# Patient Record
Sex: Male | Born: 1980 | Race: Black or African American | Hispanic: No | Marital: Single | Smoking: Current every day smoker
Health system: Southern US, Community
[De-identification: ages and names within clinical notes are randomized; demographics above are authoritative.]

## PROBLEM LIST (undated history)

## (undated) DIAGNOSIS — G44009 Cluster headache syndrome, unspecified, not intractable: Secondary | ICD-10-CM

## (undated) DIAGNOSIS — S51839A Puncture wound without foreign body of unspecified forearm, initial encounter: Secondary | ICD-10-CM

## (undated) DIAGNOSIS — I639 Cerebral infarction, unspecified: Secondary | ICD-10-CM

## (undated) DIAGNOSIS — W3400XA Accidental discharge from unspecified firearms or gun, initial encounter: Secondary | ICD-10-CM

## (undated) HISTORY — PX: SPLENECTOMY: SUR1306

## (undated) HISTORY — DX: Cerebral infarction, unspecified: I63.9

## (undated) HISTORY — PX: ABDOMINAL EXPLORATION SURGERY: SHX538

---
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
## 2005-02-28 DIAGNOSIS — Z8719 Personal history of other diseases of the digestive system: Secondary | ICD-10-CM

## 2005-02-28 HISTORY — DX: Personal history of other diseases of the digestive system: Z87.19

## 2005-05-25 ENCOUNTER — Inpatient Hospital Stay (HOSPITAL_COMMUNITY): Admission: EM | Admit: 2005-05-25 | Discharge: 2005-05-26 | Payer: Self-pay | Admitting: Emergency Medicine

## 2006-02-18 ENCOUNTER — Emergency Department (HOSPITAL_COMMUNITY): Admission: EM | Admit: 2006-02-18 | Discharge: 2006-02-18 | Payer: Self-pay | Admitting: Emergency Medicine

## 2007-07-05 ENCOUNTER — Emergency Department (HOSPITAL_COMMUNITY): Admission: EM | Admit: 2007-07-05 | Discharge: 2007-07-05 | Payer: Self-pay | Admitting: Emergency Medicine

## 2007-07-09 ENCOUNTER — Emergency Department (HOSPITAL_COMMUNITY): Admission: EM | Admit: 2007-07-09 | Discharge: 2007-07-09 | Payer: Self-pay | Admitting: Emergency Medicine

## 2007-10-13 ENCOUNTER — Emergency Department (HOSPITAL_COMMUNITY): Admission: EM | Admit: 2007-10-13 | Discharge: 2007-10-13 | Payer: Self-pay | Admitting: Emergency Medicine
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
## 2009-02-09 ENCOUNTER — Emergency Department (HOSPITAL_COMMUNITY): Admission: EM | Admit: 2009-02-09 | Discharge: 2009-02-09 | Payer: Self-pay | Admitting: Emergency Medicine

## 2009-02-13 ENCOUNTER — Emergency Department (HOSPITAL_COMMUNITY): Admission: EM | Admit: 2009-02-13 | Discharge: 2009-02-13 | Payer: Self-pay | Admitting: Emergency Medicine

## 2010-06-01 LAB — POCT I-STAT, CHEM 8
BUN: 4 mg/dL — ABNORMAL LOW (ref 6–23)
Chloride: 101 mEq/L (ref 96–112)
Creatinine, Ser: 1 mg/dL (ref 0.4–1.5)
Hemoglobin: 18.4 g/dL — ABNORMAL HIGH (ref 13.0–17.0)
Potassium: 3.9 mEq/L (ref 3.5–5.1)
Sodium: 143 mEq/L (ref 135–145)

## 2010-06-01 LAB — ETHANOL

## 2010-06-01 LAB — DIFFERENTIAL
Lymphocytes Relative: 17 % (ref 12–46)
Lymphs Abs: 2 10*3/uL (ref 0.7–4.0)
Neutro Abs: 8.5 10*3/uL — ABNORMAL HIGH (ref 1.7–7.7)
Neutrophils Relative %: 72 % (ref 43–77)

## 2010-06-01 LAB — CBC
Platelets: 257 10*3/uL (ref 150–400)
RBC: 4.8 MIL/uL (ref 4.22–5.81)
WBC: 11.8 10*3/uL — ABNORMAL HIGH (ref 4.0–10.5)

## 2010-07-16 NOTE — Discharge Summary (Signed)
Frank Warner, Frank Warner NO.:  [id number]   MEDICAL RECORD NO.:  [id number]          PATIENT TYPE:  INP   LOCATION:  3014                         FACILITY:  [facility]   PHYSICIAN:  Lonia Blood, M.D.       DATE OF BIRTH:  01-Oct-1980   DATE OF ADMISSION:  05/24/2005  DATE OF DISCHARGE:  05/26/2005                                 DISCHARGE SUMMARY   Audio too short to transcribe (less than 5 seconds)      Lonia Blood, M.D.     SL/MEDQ  D:  05/26/2005  T:  05/26/2005  Job:  [id number]
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
## 2010-11-24 LAB — POCT CARDIAC MARKERS
CKMB, poc: <1 — ABNORMAL LOW
Myoglobin, poc: 35.3

## 2010-11-24 LAB — POCT I-STAT, CHEM 8
BUN: 6
Chloride: 101
Creatinine, Ser: 1.3
Hemoglobin: 17.3 — ABNORMAL HIGH
Potassium: 3.6
Sodium: 141

## 2014-03-24 ENCOUNTER — Emergency Department (HOSPITAL_COMMUNITY)
Admission: EM | Admit: 2014-03-24 | Discharge: 2014-03-25 | Disposition: A | Discharge status: Home / Self Care | Payer: Self-pay | Attending: Emergency Medicine | Admitting: Emergency Medicine

## 2014-03-24 ENCOUNTER — Encounter (HOSPITAL_COMMUNITY): Payer: Self-pay | Admitting: Emergency Medicine

## 2014-03-24 DIAGNOSIS — Z8679 Personal history of other diseases of the circulatory system: Secondary | ICD-10-CM | POA: Insufficient documentation

## 2014-03-24 DIAGNOSIS — R51 Headache: Secondary | ICD-10-CM | POA: Insufficient documentation

## 2014-03-24 DIAGNOSIS — R519 Headache, unspecified: Secondary | ICD-10-CM

## 2014-03-24 DIAGNOSIS — Z720 Tobacco use: Secondary | ICD-10-CM | POA: Insufficient documentation

## 2014-03-24 MED ORDER — DEXAMETHASONE SODIUM PHOSPHATE 10 MG/ML IJ SOLN
10.0000 mg | Freq: Once | INTRAMUSCULAR | Status: AC
  Administered 2014-03-24: 10 mg via INTRAVENOUS
  Filled 2014-03-24 (×2): qty 1

## 2014-03-24 MED ORDER — SODIUM CHLORIDE 0.9 % IV BOLUS (SEPSIS)
1000.0000 mL | Freq: Once | INTRAVENOUS | Status: AC
  Administered 2014-03-24: 1000 mL via INTRAVENOUS

## 2014-03-24 MED ORDER — DIPHENHYDRAMINE HCL 50 MG/ML IJ SOLN
25.0000 mg | Freq: Once | INTRAMUSCULAR | Status: AC
  Administered 2014-03-24: 25 mg via INTRAVENOUS
  Filled 2014-03-24: qty 1

## 2014-03-24 MED ORDER — METOCLOPRAMIDE HCL 5 MG/ML IJ SOLN
10.0000 mg | Freq: Once | INTRAMUSCULAR | Status: AC
  Administered 2014-03-24: 10 mg via INTRAVENOUS
  Filled 2014-03-24: qty 2

## 2014-03-24 MED ORDER — KETOROLAC TROMETHAMINE 30 MG/ML IJ SOLN
30.0000 mg | Freq: Once | INTRAMUSCULAR | Status: AC
  Administered 2014-03-24: 30 mg via INTRAVENOUS
  Filled 2014-03-24: qty 1

## 2014-03-24 NOTE — ED Notes (Signed)
Pt. reports migraine headache for 1 week with occasional nausea/vomitting x2 today , denies blurred vision . No fever or chills.

## 2014-03-24 NOTE — ED Provider Notes (Signed)
CSN: [id number]     Arrival date & time 03/24/14  1909 History   First MD Initiated Contact with Patient 03/24/14 2216     Chief Complaint  Patient presents with  . Migraine   (Consider location/radiation/quality/duration/timing/severity/associated sxs/prior Treatment) HPI  Frank Warner is a 34 yo male presenting with report of headache 1 week. He states the headache has been intermittent but very painful when they occur.  He states the pain initially began gradually across the right side of his forehead and around to his parietal scalp.  He noticed the pain would improve after drinking water and taking ibuprofen but it would return with movement or bending over.  He reports during one episode his right eye began tearing as if he was crying but his right one did not. He also reports nausea and intermittent vomiting with the headache. He has a history of similar headaches.  He denies fevers, chills, neck stiffness, blurred vision, focal weakness or focal deficit.     Past Medical History  Diagnosis Date  . Migraine headache    History reviewed. No pertinent past surgical history. No family history on file. History  Substance Use Topics  . Smoking status: Current Every Day Smoker  . Smokeless tobacco: Not on file  . Alcohol Use: Yes    Review of Systems  Constitutional: Negative for fever and chills.  HENT: Negative for sore throat.   Eyes: Negative for visual disturbance.  Respiratory: Negative for cough and shortness of breath.   Cardiovascular: Negative for chest pain and leg swelling.  Gastrointestinal: Positive for nausea and vomiting. Negative for diarrhea.  Genitourinary: Negative for dysuria.  Musculoskeletal: Negative for myalgias.  Skin: Negative for rash.  Neurological: Positive for headaches. Negative for weakness and numbness.      Allergies  Review of patient's allergies indicates no known allergies.  Home Medications   Prior to Admission medications    Medication Sig Start Date End Date Taking? Authorizing Provider  acetaminophen (TYLENOL) 500 MG tablet Take 1,000 mg by mouth every 6 (six) hours as needed for headache.   Yes Historical Provider, MD  ibuprofen (ADVIL,MOTRIN) 200 MG tablet Take 800 mg by mouth every 6 (six) hours as needed for headache.   Yes Historical Provider, MD   BP 131/72 mmHg  Pulse 63  Temp(Src) 98.2 F (36.8 C) (Oral)  Resp 18  SpO2 97% Physical Exam  Constitutional: He is oriented to person, place, and time. He appears well-developed and well-nourished. No distress.  HENT:  Head: Normocephalic and atraumatic.  Mouth/Throat: Oropharynx is clear and moist.  Eyes: Conjunctivae are normal. Pupils are equal, round, and reactive to light.  Neck: Normal range of motion. Neck supple. No thyromegaly present.  Cardiovascular: Normal rate, regular rhythm and intact distal pulses.   Pulmonary/Chest: Effort normal and breath sounds normal. No respiratory distress.  Abdominal: Soft. There is no tenderness.  Musculoskeletal: He exhibits no tenderness.  Lymphadenopathy:    He has no cervical adenopathy.  Neurological: He is alert and oriented to person, place, and time. He has normal strength. No cranial nerve deficit or sensory deficit. Coordination normal. GCS eye subscore is 4. GCS verbal subscore is 5. GCS motor subscore is 6.  Cranial nerves 2-12 intact  Skin: Skin is warm and dry. No rash noted. He is not diaphoretic.  Psychiatric: He has a normal mood and affect.  Nursing note and vitals reviewed.   ED Course  Procedures (including critical care time) Labs Review Labs Reviewed -  No data to display  Imaging Review No results found.   EKG Interpretation None      MDM   Final diagnoses:  Nonintractable headache, unspecified chronicity pattern, unspecified headache type   34 yo with history of recurrent headaches. His presentation is like pts typical HA as it was gradual in onset and hurts on the right  side of his forehead and parietal scalp. His exam is non concerning for [facility], ICH, Meningitis, or temporal arteritis. He is afebrile with no focal neuro deficits, nuchal rigidity, or change in vision. HA treated with NS bolus and toradol, compazine and benedryl. However not much improvement of symptoms until IV decadron given.  Relief of symptoms after decadron. Resources provided to establish care with PCP for follow-up.  Pt is well-appearing and in no acute distress. Pt verbalizes understanding and is agreeable with plan to dc.    Filed Vitals:   03/24/14 1912 03/24/14 2235 03/24/14 2344  BP: 131/72 126/89   Pulse: 63 57   Temp: 98.2 F (36.8 C)    TempSrc: Oral    Resp: 18    SpO2: 97% 100% 100%   Meds given in ED:  Medications  sodium chloride 0.9 % bolus 1,000 mL (0 mLs Intravenous Stopped 03/24/14 2356)  ketorolac (TORADOL) 30 MG/ML injection 30 mg (30 mg Intravenous Given 03/24/14 2253)  metoCLOPramide (REGLAN) injection 10 mg (10 mg Intravenous Given 03/24/14 2253)  diphenhydrAMINE (BENADRYL) injection 25 mg (25 mg Intravenous Given 03/24/14 2253)  dexamethasone (DECADRON) injection 10 mg (10 mg Intravenous Given 03/24/14 2348)    Discharge Medication List as of 03/25/2014 12:00 AM         Harle Battiest, NP 03/26/14 1441  Hilario Quarry, MD 04/04/14 1016

## 2014-03-24 NOTE — ED Notes (Signed)
PA at bedside.

## 2014-03-24 NOTE — ED Notes (Signed)
Per ems-- pt c/o migraine headache x 1 week. Taking ibuprofen and tylenol without much relief.

## 2014-03-25 NOTE — Discharge Instructions (Signed)
Please follow the directions provided. Use the resource guide or the referral given to establish care with a primary care doctor for further management and evaluation of your headaches. When you feel a headache started to come on it may be helpful to drink plenty of fluid and take a dose of the medicine called Excedrin Migraine as directed.  Don't hesitate to return for any new, worsening, or concerning symptoms.   SEEK IMMEDIATE MEDICAL CARE IF:  Your migraine becomes severe.  You have a fever.  You have a stiff neck.  You have vision loss.  You have muscular weakness or loss of muscle control.  You start losing your balance or have trouble walking.  You feel faint or pass out.  You have severe symptoms that are different from your first symptoms.   Emergency Department Resource Guide 1) Find a Doctor and Pay Out of Pocket Although you won't have to find out who is covered by your insurance plan, it is a good idea to ask around and get recommendations. You will then need to call the office and see if the doctor you have chosen will accept you as a new patient and what types of options they offer for patients who are self-pay. Some doctors offer discounts or will set up payment plans for their patients who do not have insurance, but you will need to ask so you aren't surprised when you get to your appointment.  2) Contact Your Local Health Department Not all health departments have doctors that can see patients for sick visits, but many do, so it is worth a call to see if yours does. If you don't know where your local health department is, you can check in your phone book. The CDC also has a tool to help you locate your state's health department, and many state websites also have listings of all of their local health departments.  3) Find a Walk-in Clinic If your illness is not likely to be very severe or complicated, you may want to try a walk in clinic. These are popping up all over the  country in pharmacies, drugstores, and shopping centers. They're usually staffed by nurse practitioners or physician assistants that have been trained to treat common illnesses and complaints. They're usually fairly quick and inexpensive. However, if you have serious medical issues or chronic medical problems, these are probably not your best option.  No Primary Care Doctor: - Call Health Connect at  [phone] - they can help you locate a primary care doctor that  accepts your insurance, provides certain services, etc. - Physician Referral Service- [phone]  Chronic Pain Problems: Organization         Address  Phone   Notes  [facility]  [phone] Patients need to be referred by their primary care doctor.   Medication Assistance: Organization         Address  Phone   Notes  [facility] Medication [facility] [address] [place] [place], [place] [address] [phone] --Must be a resident of [facility] -- Must have NO insurance coverage whatsoever (no Medicaid/ Medicare, etc.) -- The pt. MUST have a primary care doctor that directs their care regularly and follows them in the community   MedAssist  [phone]   [employer]  [phone]    Agencies that provide inexpensive medical care: Organization         Address  Phone   Notes  [facility]  [phone]   [facility]    [phone]   [facility] [address] [place], [place] [address] [phone]) [phone]   [facility] [place] [address], [place] [phone](336) [phone]   Planned Parenthood    [phone](336) [phone]   [facility]    [phone]) [phone]   [facility]  [address], [place] Phone:  [phone](336) [phone], Fax:  [phone] Hours of Operation:  9 am - 6 pm, M-F.  Also accepts Medicaid/Medicare and self-pay.  [facility]  [address], [place] Phone: [phone](336) [phone], Fax: [phone](336) [phone]. Hours of Operation:  8:30 am - 5:30 pm, M-F.  Also accepts Medicaid and self-pay.  [facility] [place] [address], [place] Phone: [phone](336) [phone]   [facility] [address], [place], [place] [phone], Ext. [phone] Mondays & Thursdays: 7-9 AM.  First 15 patients are seen on a first come, first serve basis.    Medicaid-accepting [facility] Providers:  Organization         Address  Phone   Notes  [facility] [phone], [place] [phone]) [phone] Also accepts self-pay patients.  [facility] [address] [place], [place]  [phone]) [phone]   [facility] [address], [place] [phone](336) [phone]   [facility] [address] [place] [address], [place] [phone](336) [phone]   Renaye RakersVeita Bland [address], [place]   [phone]) [phone] Only accepts [place] Access [place] patients after they have their name applied to their card.   Self-Pay (no insurance) in [facility]:  Organization         Address  Phone   Notes  Sickle Cell Patients, [facility] [address], [place] [phone](336) [phone]   [facility] [address], [place] [phone](336) [phone]   [facility] [place]  [address], [place] [phone](336) [phone]   [facility]/Dr. Osei-Bonsu  [address] [place] [address], [place] or [phone], [place] [phone]) [phone] Phone number for both [place] and [place] locations is the same.  [facility] [address], [place] [phone]) [phone]   [facility] [place] [address] [place] [address], [place] or [address] [phone](336) [phone](336) [phone]   [facility] [address], [place] [phone](336) [phone], phone; [phone](336) [phone], fax Sees patients 1st and 3rd Saturday of every month.  Must  not qualify for public or private insurance (i.e. Medicaid, Medicare, [place] Health Choice, Veterans' Benefits)  Household income should be no more than 200% of the poverty level The clinic cannot treat you if you are pregnant or think you are pregnant  Sexually transmitted diseases are not treated at the clinic.    Dental Care: Organization         Address  Phone  Notes  [facility] [address], [place] [phone]) [phone] Accepts children up to age 32 who are enrolled in [place] or [place] Health Choice; pregnant women with a Medicaid card; and children who have applied for Medicaid or [place] Health Choice, but were declined, whose parents can pay a reduced fee at time of service.  [facility] Department of [employer] [place]  [address], [place] [phone] Accepts children up to age 56 who are enrolled in Medicaid or [place] Health Choice; pregnant women with a Medicaid card; and children who have applied for Medicaid or [place] Health Choice, but were declined, whose parents can pay a reduced fee at time of service.  [facility]  [address] [place], [place] [phone] Patients are seen by appointment only. Walk-ins are not accepted. [facility] will see patients 43 years of age and older. Monday - Tuesday (8am-5pm) Most Wednesdays (8:30-5pm) $30 per visit, cash only  [facility] Adult [facility] Access PRO[facility]  [address], [facility] [phone] Patients are seen by appointment only. Walk-ins are not accepted. [facility] will see patients 22 years of age and older. One Wednesday Evening (Monthly: Volunteer Based).  $30 per visit, cash only  [employer]  [phone] for adults; Children under age 22, call Graduate Pediatric Dentistry at [phone]. Children aged 51-14, please call [phone] to request a pediatric application.  Dental services are  provided in all areas of dental care including fillings, crowns and bridges, complete and partial dentures, implants, gum treatment, root canals, and extractions. Preventive care is also provided. Treatment is provided to both adults and children. Patients are selected via a lottery and there is often a waiting list.   [facility] [address], [place]  [phone] [email or web address]   [facility] [address] [place], [place] [phone], Ext. [phone] Second and Fourth Thursday of each month, opens at 6:30 AM; Clinic ends at 9 AM.  Patients are seen on a first-come first-served basis, and a limited number are seen during each clinic.   [facility]  [address] [place] [place], [place] [phone]   Eligibility Requirements You must have lived in [place], [place], or [place] counties for at least the last three months.   You cannot be eligible for state or federal sponsored [place], including [employer], [place], or [employer].   You generally cannot be eligible for healthcare insurance through your employer.    How to apply: Eligibility screenings are held every Tuesday and Wednesday afternoon from 1:00 pm until 4:00 pm. You do not need an appointment for the interview!  [facility] [address], [place], [place] [phone]   [facility] Health Department  [phone]   [facility] Health Department  [phone]   [facility] Health Department  [phone]    Behavioral Health Resources in the Community: Intensive Outpatient Programs Organization         Address  Phone  Notes  [facility] [address], [place], [place] [phone]   [facility] Outpatient [address], [place], [place] [phone]   [facility]: [facility] [address], [place], [place]  [phone]   [facility] Mental Health [address]  [place], [place]  [phone] or [phone]   Substance Abuse Resources Organization         Address  Phone  Notes  Al[facility] Drug Services  [phone]   [facility]  [phone]   [facility] [place]  [phone]   Floydene Flock  [phone]   Residential & Outpatient Substance Abuse Program  [phone]   Psychological Services Organization         Address  Phone  Notes  [facility] Behavioral Health  [phone]  [employer] Services  [phone]   [facility] Mental Health [address] [place] [phone] or [phone]    Mobile Crisis Teams Organization         Address  Phone  Notes  [facility], Mobile Crisis Care Unit  [phone]   [facility]  [address] [place] [address] [place], [place] [phone]   Doristine Locks [address] [place] [phone]    Self-Help/Support Groups Organization         Address  Phone             Notes  [facility] [place] - variety of support groups  [phone]- I7437963 Call for more information  Narcotics Anonymous (NA), [facility] [address], [employer] [place]  2 meetings at this location   Statistician         Address  Phone  Notes  [facility] [address],    [place]  [phone]   [facility]  [address] [place] [address], [place], [place] [phone]   [facility] Treatment Facility [address] [place], [place] [phone] Admissions: 8am-3pm M-F  [facility] [address]    [place], [place] [phone]   [facility] [address] [place], [place], [place] [phone]   [facility] [address]  [place], [place] [phone]   [facility] - Intensive Outpatient [address], [place], [place] [phone]   [facility] ([facility] [address] [place]  [place], [place] [phone] or  [phone]   [facility] ([facility]) [address], [place], [place] [phone] Accepts Medicaid  [facility] [address],  [place] [phone] Substance Abuse/Addiction Treatment   [facility] Organization         Address  Phone  Notes  [facility]  [phone]   Angie Fava, PhD [address] [place], [place]   [phone] or [phone]   [facility] Behavioral   [address] [place], [place] [phone]   [facility] [address], [place], [place] [phone] Insurance/Medicaid/sponsorship through [facility] [address]                                    [place], [place] [phone] Therapy/tele-psych/case  [facility] [address], [place] [phone]    Dr. Lolly Mustache  [phone]   [facility] [place] [facility]  [employer] [facility] [place] [facility] Dept. 1) [address] [place] 2) [address] [place] 3)  [address], [place] [phone]  [phone]   [facility]al Health Child Abuse Hotline 857-147-1663 or [phone] (After Hours)

## 2014-07-29 ENCOUNTER — Encounter (HOSPITAL_COMMUNITY): Payer: Self-pay | Admitting: Emergency Medicine

## 2014-07-29 ENCOUNTER — Emergency Department (HOSPITAL_COMMUNITY): Payer: Self-pay

## 2014-07-29 ENCOUNTER — Inpatient Hospital Stay (HOSPITAL_COMMUNITY)
Admission: EM | Admit: 2014-07-29 | Discharge: 2014-08-06 | DRG: 131 | Disposition: A | Discharge status: Home / Self Care | Payer: Self-pay | Attending: Internal Medicine | Admitting: Internal Medicine

## 2014-07-29 DIAGNOSIS — Z720 Tobacco use: Secondary | ICD-10-CM

## 2014-07-29 DIAGNOSIS — K047 Periapical abscess without sinus: Principal | ICD-10-CM

## 2014-07-29 DIAGNOSIS — K045 Chronic apical periodontitis: Secondary | ICD-10-CM | POA: Diagnosis present

## 2014-07-29 DIAGNOSIS — K029 Dental caries, unspecified: Secondary | ICD-10-CM | POA: Diagnosis present

## 2014-07-29 DIAGNOSIS — K040 Pulpitis: Secondary | ICD-10-CM | POA: Diagnosis present

## 2014-07-29 DIAGNOSIS — S025XXA Fracture of tooth (traumatic), initial encounter for closed fracture: Secondary | ICD-10-CM | POA: Diagnosis present

## 2014-07-29 DIAGNOSIS — F17210 Nicotine dependence, cigarettes, uncomplicated: Secondary | ICD-10-CM | POA: Diagnosis present

## 2014-07-29 DIAGNOSIS — R591 Generalized enlarged lymph nodes: Secondary | ICD-10-CM | POA: Diagnosis present

## 2014-07-29 DIAGNOSIS — D72829 Elevated white blood cell count, unspecified: Secondary | ICD-10-CM

## 2014-07-29 DIAGNOSIS — Z23 Encounter for immunization: Secondary | ICD-10-CM

## 2014-07-29 DIAGNOSIS — L0201 Cutaneous abscess of face: Secondary | ICD-10-CM

## 2014-07-29 DIAGNOSIS — M272 Inflammatory conditions of jaws: Secondary | ICD-10-CM | POA: Diagnosis present

## 2014-07-29 DIAGNOSIS — K0889 Other specified disorders of teeth and supporting structures: Secondary | ICD-10-CM

## 2014-07-29 DIAGNOSIS — L03211 Cellulitis of face: Secondary | ICD-10-CM | POA: Diagnosis present

## 2014-07-29 DIAGNOSIS — K05219 Aggressive periodontitis, localized, unspecified severity: Secondary | ICD-10-CM

## 2014-07-29 LAB — CBC WITH DIFFERENTIAL/PLATELET
BASOS ABS: 0 10*3/uL (ref 0.0–0.1)
Basophils Relative: 0 % (ref 0–1)
EOS ABS: 0 10*3/uL (ref 0.0–0.7)
Eosinophils Relative: 0 % (ref 0–5)
HEMATOCRIT: 46.6 % (ref 39.0–52.0)
Hemoglobin: 16.5 g/dL (ref 13.0–17.0)
LYMPHS ABS: 2.1 10*3/uL (ref 0.7–4.0)
Lymphocytes Relative: 7 % — ABNORMAL LOW (ref 12–46)
MCH: 32.5 pg (ref 26.0–34.0)
MCHC: 35.4 g/dL (ref 30.0–36.0)
MCV: 91.9 fL (ref 78.0–100.0)
MONO ABS: 2.4 10*3/uL — AB (ref 0.1–1.0)
Monocytes Relative: 8 % (ref 3–12)
NEUTROS ABS: 25 10*3/uL — AB (ref 1.7–7.7)
Neutrophils Relative %: 85 % — ABNORMAL HIGH (ref 43–77)
Platelets: 226 10*3/uL (ref 150–400)
RBC: 5.07 MIL/uL (ref 4.22–5.81)
RDW: 13.4 % (ref 11.5–15.5)
Smear Review: ADEQUATE
WBC: 29.5 10*3/uL — ABNORMAL HIGH (ref 4.0–10.5)

## 2014-07-29 LAB — BASIC METABOLIC PANEL
Anion gap: 11 (ref 5–15)
BUN: <5 mg/dL — ABNORMAL LOW (ref 6–20)
CHLORIDE: 99 mmol/L — AB (ref 101–111)
CO2: 24 mmol/L (ref 22–32)
Calcium: 9.1 mg/dL (ref 8.9–10.3)
Creatinine, Ser: 1.23 mg/dL (ref 0.61–1.24)
GFR calc Af Amer: >60 mL/min (ref >60)
GFR calc non Af Amer: >60 mL/min (ref >60)
GLUCOSE: 108 mg/dL — AB (ref 65–99)
POTASSIUM: 4.2 mmol/L (ref 3.5–5.1)
SODIUM: 134 mmol/L — AB (ref 135–145)

## 2014-07-29 MED ORDER — MORPHINE SULFATE 4 MG/ML IJ SOLN
4.0000 mg | Freq: Once | INTRAMUSCULAR | Status: AC
  Administered 2014-07-29: 4 mg via INTRAVENOUS
  Filled 2014-07-29: qty 1

## 2014-07-29 MED ORDER — HYDROMORPHONE HCL 1 MG/ML IJ SOLN
0.5000 mg | INTRAMUSCULAR | Status: DC | PRN
  Administered 2014-07-30 (×2): 0.5 mg via INTRAVENOUS
  Filled 2014-07-29 (×2): qty 1

## 2014-07-29 MED ORDER — HYDROMORPHONE HCL 1 MG/ML IJ SOLN
0.5000 mg | Freq: Once | INTRAMUSCULAR | Status: AC
  Administered 2014-07-29: 0.5 mg via INTRAVENOUS
  Filled 2014-07-29: qty 1

## 2014-07-29 MED ORDER — IOHEXOL 300 MG/ML  SOLN
75.0000 mL | Freq: Once | INTRAMUSCULAR | Status: AC | PRN
  Administered 2014-07-29: 75 mL via INTRAVENOUS

## 2014-07-29 MED ORDER — CLINDAMYCIN PHOSPHATE 600 MG/50ML IV SOLN
600.0000 mg | Freq: Once | INTRAVENOUS | Status: AC
  Administered 2014-07-29: 600 mg via INTRAVENOUS
  Filled 2014-07-29: qty 50

## 2014-07-29 MED ORDER — SODIUM CHLORIDE 0.9 % IV SOLN
3.0000 g | Freq: Four times a day (QID) | INTRAVENOUS | Status: DC
  Administered 2014-07-30 – 2014-08-02 (×14): 3 g via INTRAVENOUS
  Filled 2014-07-29 (×17): qty 3

## 2014-07-29 MED ORDER — HEPARIN SODIUM (PORCINE) 5000 UNIT/ML IJ SOLN
5000.0000 [IU] | Freq: Three times a day (TID) | INTRAMUSCULAR | Status: AC
  Administered 2014-07-30 – 2014-08-04 (×18): 5000 [IU] via SUBCUTANEOUS
  Filled 2014-07-29 (×15): qty 1

## 2014-07-29 MED ORDER — ONDANSETRON HCL 4 MG/2ML IJ SOLN
4.0000 mg | Freq: Once | INTRAMUSCULAR | Status: AC
  Administered 2014-07-29: 4 mg via INTRAVENOUS
  Filled 2014-07-29: qty 2

## 2014-07-29 MED ORDER — BENZOCAINE 10 % MT GEL: Freq: Four times a day (QID) | OROMUCOSAL | Status: DC | PRN
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
## 2014-07-29 NOTE — ED Notes (Signed)
Pt to CT

## 2014-07-29 NOTE — ED Notes (Signed)
Pt. reports worsening right lower molar pain with swelling onset this week , unrelieved by OTC Oragel .

## 2014-07-29 NOTE — ED Notes (Signed)
MD at bedside. 

## 2014-07-30 ENCOUNTER — Observation Stay (HOSPITAL_COMMUNITY): Payer: Self-pay

## 2014-07-30 DIAGNOSIS — L0201 Cutaneous abscess of face: Secondary | ICD-10-CM

## 2014-07-30 DIAGNOSIS — K029 Dental caries, unspecified: Secondary | ICD-10-CM

## 2014-07-30 DIAGNOSIS — L03211 Cellulitis of face: Secondary | ICD-10-CM

## 2014-07-30 MED ORDER — OXYCODONE-ACETAMINOPHEN 5-325 MG PO TABS
1.0000 | ORAL_TABLET | Freq: Four times a day (QID) | ORAL | Status: DC
  Administered 2014-07-30 – 2014-08-04 (×21): 1 via ORAL
  Filled 2014-07-30 (×21): qty 1

## 2014-07-30 MED ORDER — DEXAMETHASONE SODIUM PHOSPHATE 4 MG/ML IJ SOLN: 4.0000 mg | Freq: Four times a day (QID) | INTRAMUSCULAR | Status: DC

## 2014-07-30 MED ORDER — PNEUMOCOCCAL VAC POLYVALENT 25 MCG/0.5ML IJ INJ
0.5000 mL | INJECTION | INTRAMUSCULAR | Status: AC
  Administered 2014-07-31: 0.5 mL via INTRAMUSCULAR

## 2014-07-30 MED ORDER — HYDROMORPHONE HCL 1 MG/ML IJ SOLN
1.0000 mg | INTRAMUSCULAR | Status: DC | PRN
  Administered 2014-07-30 – 2014-08-05 (×26): 1 mg via INTRAVENOUS
  Filled 2014-07-30 (×28): qty 1

## 2014-07-30 NOTE — Progress Notes (Addendum)
[facility] Progress Note   Frank Warner ZOX:[id number] DOB: 04/07/1980 DOA: 07/29/2014 PCP: No PCP Per Patient  Brief narrative: Frank Pihyren Mccowan is a 34 y.o. male who presents to the ER for right 1st molar pain, swelling of right face and jaw. Initial CT scan did not reveal an abscess but I have just received a call from the radiologist, Dr. Lindi Adiean Boyle, this morning reviewed the CT and feels that he has a right mandibular subperiosteal abscess measuring 0.5 x 2.3 cm.   Subjective: Patient is complaining of severe pain in his right face and right lower jaw. He is not having any trouble with swallowing.  Assessment/Plan: Principal Problem:   Odontogenic infection of jaw/abscess and cellulitis right face - [facility] - Dental caries-abscess noted above-have contacted Dr. Robin SearingKulinsky (dental surgeon) who is not on call today and not available until Monday- he recommends to continue antibiotics and f/u with dental as outpt- if patient is still here on Monday, he will see the patient.  -have spoken with ENT, Dr Jearld FentonByers who is not going to see the patient and recommends Dental surgery to see the patient - WBC count was 29 on admission - Continue Unasyn - Increase Dilaudid and add hydrocodone to help control pain - Full liquid diet  Code Status: Full code Family Communication:  Disposition Plan:  DVT prophylaxis: Heparin Consultants: ENT/dental surgery Procedures:  Antibiotics: Anti-infectives    Start     Dose/Rate Route Frequency Ordered Stop   07/29/14 2359  Ampicillin-Sulbactam (UNASYN) 3 g in sodium chloride 0.9 % 100 mL IVPB     3 g 100 mL/hr over 60 Minutes Intravenous Every 6 hours 07/29/14 2329     07/29/14 2015  clindamycin (CLEOCIN) IVPB 600 mg     600 mg 100 mL/hr over 30 Minutes Intravenous  Once 07/29/14 2003 07/29/14 2248      Objective: Filed Weights   07/30/14 0148  Weight: 97.2 kg (214 lb 4.6 oz)    Intake/Output Summary  (Last 24 hours) at 07/30/14 1250 Last data filed at 07/30/14 0947  Gross per 24 hour  Intake   1260 ml  Output      0 ml  Net   1260 ml     Vitals Filed Vitals:   07/29/14 1928 07/29/14 2314 07/30/14 0148 07/30/14 0522  BP: 138/103 142/90 164/85 144/72  Pulse: 91 87 88 67  Temp: 98.2 F (36.8 C)  99.5 F (37.5 C) 99.1 F (37.3 C)  TempSrc: Oral  Oral Oral  Resp: 22 18  17   Height:   6' (1.829 m)   Weight:   97.2 kg (214 lb 4.6 oz)   SpO2: 99% 99% 92% 100%    Exam:  General:  Pt is alert, not in acute distress  HEENT: No icterus, No thrush, oral mucosa moist- swelling of right mandibular and maxillary area with tenderness  Cardiovascular: regular rate and rhythm, S1/S2 No murmur  Respiratory: clear to auscultation bilaterally   Abdomen: Soft, +Bowel sounds, non tender, non distended, no guarding  MSK: No LE edema, cyanosis or clubbing  Data Reviewed: Basic Metabolic Panel:  Recent Labs Lab 07/29/14 2155  NA 134*  K 4.2  CL 99*  CO2 24  GLUCOSE 108*  BUN <5*  CREATININE 1.23  CALCIUM 9.1   Liver Function Tests: No results for input(s): AST, ALT, ALKPHOS, BILITOT, PROT, ALBUMIN in the last 168 hours. No results for input(s): LIPASE, AMYLASE in the last  168 hours. No results for input(s): AMMONIA in the last 168 hours. CBC:  Recent Labs Lab 07/29/14 2044  WBC 29.5*  NEUTROABS 25.0*  HGB 16.5  HCT 46.6  MCV 91.9  PLT 226   Cardiac Enzymes: No results for input(s): CKTOTAL, CKMB, CKMBINDEX, TROPONINI in the last 168 hours. BNP (last 3 results) No results for input(s): BNP in the last 8760 hours.  ProBNP (last 3 results) No results for input(s): PROBNP in the last 8760 hours.  CBG: No results for input(s): GLUCAP in the last 168 hours.  No results found for this or any previous visit (from the past 240 hour(s)).   Studies: Dg Orthopantogram  07/30/2014   CLINICAL DATA:  Swelling to right jawline/toothache since yesterday.  EXAM:  ORTHOPANTOGRAM/PANORAMIC  COMPARISON:  Soft tissue neck CT today.  FINDINGS: Bony structures are within normal. No significant periodontal disease. Bilateral upper and lower dental caries.  IMPRESSION: No focal bony abnormality and no significant periodontal disease. Several dental caries.   Electronically Signed   By: Elberta Fortis M.D.   On: 07/30/2014 12:24   Ct Soft Tissue Neck W Contrast  07/29/2014   CLINICAL DATA:  Dental abscess to right lower molar. 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 Evaluate extent of the abscess. Dental pain, swelling onset this week.  EXAM: CT NECK WITH CONTRAST  TECHNIQUE: Multidetector CT imaging of the neck was performed using the standard protocol following the bolus administration of intravenous contrast.  CONTRAST:  75mL OMNIPAQUE IOHEXOL 300 MG/ML  SOLN  COMPARISON:  None.  FINDINGS: Pharynx and larynx: Prominent adenoidal soft tissue. Prominent tonsillar hypertrophy, right greater than left. No fluid collection. There is no retropharyngeal effusion, fluid collection or abscess. The epiglottis is normal. Diffuse soft tissue stranding about the right face and neck without subcutaneous fluid collection.  Salivary glands: No inflammation.  Thyroid: Normal.  Lymph nodes: Prominent cervical lymph nodes, right greater than left, likely reactive.  Vascular: Patent, no thrombosis or inflammation.  Limited intracranial: Normal.  Visualized orbits: Normal.  Mastoids and visualized paranasal sinuses: Dental carie of the right upper molar without periapical lucency or abscess. No periapical lucencies about any tooth. Mild mucosal thickening of the left maxillary sinus, paranasal sinuses are otherwise clear. Mastoids are clear.  Skeleton: Normal cervical spine alignment.  Upper chest: Included mediastinum is normal. No mediastinal adenopathy or fluid collection. No mediastinal inflammation. The lung apices are clear, no consolidation.  IMPRESSION: 1. Dental carie of the right upper molar. No  definite CT abnormality of the right lower molars. There is soft tissue stranding about the right face and upper neck. No periapical or soft tissue abscess. No inflammatory extension to the mediastinum or retropharyngeal soft tissues. 2. Prominent cervical lymph nodes, right greater than left are likely reactive. Prominence of the palatine tonsils and adenoids, also likely reactive.   Electronically Signed   By: Rubye Oaks M.D.   On: 07/29/2014 21:32    Scheduled Meds:  Scheduled Meds: . ampicillin-sulbactam (UNASYN) IV  3 g Intravenous Q6H  . heparin  5,000 Units Subcutaneous 3 times per day  . oxyCODONE-acetaminophen  1 tablet Oral Q6H  . [START ON 07/31/2014] pneumococcal 23 valent vaccine  0.5 mL Intramuscular Tomorrow-1000   Continuous Infusions:   Time spent on care of this patient: 35 min   Dacoda Spallone, MD 07/30/2014, 12:50 PM    [facility] Office  [phone] Pager - Text Page per [email or web address] If 7PM-7AM, please contact night-coverage [email or web address]

## 2014-07-31 DIAGNOSIS — L0201 Cutaneous abscess of face: Secondary | ICD-10-CM

## 2014-07-31 LAB — CBC
HCT: 44.7 % (ref 39.0–52.0)
Hemoglobin: 14.9 g/dL (ref 13.0–17.0)
MCH: 31.5 pg (ref 26.0–34.0)
MCHC: 33.3 g/dL (ref 30.0–36.0)
MCV: 94.5 fL (ref 78.0–100.0)
Platelets: 213 10*3/uL (ref 150–400)
RBC: 4.73 MIL/uL (ref 4.22–5.81)
RDW: 13.6 % (ref 11.5–15.5)
WBC: 20.2 10*3/uL — AB (ref 4.0–10.5)

## 2014-07-31 NOTE — Progress Notes (Signed)
[facility] Progress Note   Frank Warner ZOX:[id number] DOB: [birth year] DOA: 07/29/2014 PCP: No PCP Per Patient  Brief narrative: Frank Warner is a 34 y.o. male who presents to the ER for right 1st molar pain, swelling of right face and jaw. Initial CT scan did not reveal an abscess but I have just received a call from the radiologist, Dr. Lindi Adie, this morning reviewed the CT and feels that he has a right mandibular subperiosteal abscess measuring 0.5 x 2.3 cm.  Subjective: Swelling and pain in right jaw slightly improved. Wants to try soft food.   Assessment/Plan: Principal Problem:   Odontogenic infection of jaw/abscess and cellulitis right face - we have NO DENTAL COVERAGE at [facility]  - Dental caries-abscess noted above- contacted Dr. Robin Searing (dental surgeon) on 6/1 who was not on call  and will not available until Monday- he recommends to continue antibiotics and f/u with dental as outpt- if patient is still here on Monday, he will see the patient.  -as we had no official dental coverage, I did speak with ENT, Dr Jearld Fenton, in regards to the above mentioned abscess, he stated that he is not going to see the patient and recommended Dental surgery to see the patient - WBC count was 29 on admission- steadily improving - Continue Unasyn - Increased Dilaudid and add hydrocodone to help control pain - advance to soft diet today  Code Status: Full code Family Communication:  Disposition Plan:  DVT prophylaxis: Heparin Consultants: ENT/dental surgery-  phone call only  Procedures:  Antibiotics: Anti-infectives    Start     Dose/Rate Route Frequency Ordered Stop   07/29/14 2359  Ampicillin-Sulbactam (UNASYN) 3 g in sodium chloride 0.9 % 100 mL IVPB     3 g 100 mL/hr over 60 Minutes Intravenous Every 6 hours 07/29/14 2329     07/29/14 2015  clindamycin (CLEOCIN) IVPB 600 mg     600 mg 100 mL/hr over 30 Minutes Intravenous  Once 07/29/14 2003 07/29/14 2248       Objective: Filed Weights   07/30/14 0148  Weight: 97.2 kg (214 lb 4.6 oz)    Intake/Output Summary (Last 24 hours) at 07/31/14 1153 Last data filed at 07/31/14 0900  Gross per 24 hour  Intake   1727 ml  Output      0 ml  Net   1727 ml     Vitals Filed Vitals:   07/30/14 0522 07/30/14 1307 07/30/14 2212 07/31/14 0621  BP: 144/72 136/84 151/87 133/69  Pulse: 67 68 66 57  Temp: 99.1 F (37.3 C) 98.6 F (37 C) 99 F (37.2 C) 98.8 F (37.1 C)  TempSrc: Oral Oral Oral Oral  Resp: Height:      Weight:      SpO2: 100% 100% 100% 99%    Exam:  General:  Pt is alert, not in acute distress  HEENT: No icterus, No thrush, oral mucosa moist- swelling of right mandibular and maxillary area with tenderness  Cardiovascular: regular rate and rhythm, S1/S2 No murmur  Respiratory: clear to auscultation bilaterally   Abdomen: Soft, +Bowel sounds, non tender, non distended, no guarding  MSK: No LE edema, cyanosis or clubbing  Data Reviewed: Basic Metabolic Panel:  Recent Labs Lab 07/29/14 2155  NA 134*  K 4.2  CL 99*  CO2 24  GLUCOSE 108*  BUN <5*  CREATININE 1.23  CALCIUM 9.1   Liver Function Tests: No results for input(s): AST, ALT, ALKPHOS,  BILITOT, PROT, ALBUMIN in the last 168 hours. No results for input(s): LIPASE, AMYLASE in the last 168 hours. No results for input(s): AMMONIA in the last 168 hours. CBC:  Recent Labs Lab 07/29/14 2044 07/31/14 0351  WBC 29.5* 20.2*  NEUTROABS 25.0*  --   HGB 16.5 14.9  HCT 46.6 44.7  MCV 91.9 94.5  PLT 226 213   Cardiac Enzymes: No results for input(s): CKTOTAL, CKMB, CKMBINDEX, TROPONINI in the last 168 hours. BNP (last 3 results) No results for input(s): BNP in the last 8760 hours.  ProBNP (last 3 results) No results for input(s): PROBNP in the last 8760 hours.  CBG: No results for input(s): GLUCAP in the last 168 hours.  No results found for this or any previous visit (from the past 240  hour(s)).   Studies: Dg Orthopantogram  07/30/2014   CLINICAL DATA:  Swelling to right jawline/toothache since yesterday.  EXAM: ORTHOPANTOGRAM/PANORAMIC  COMPARISON:  Soft tissue neck CT today.  FINDINGS: Bony structures are within normal. No significant periodontal disease. Bilateral upper and lower dental caries.  IMPRESSION: No focal bony abnormality and no significant periodontal disease. Several dental caries.   Electronically Signed   By: Elberta Fortisaniel  Boyle M.D.   On: 07/30/2014 12:24   Ct Soft Tissue Neck W Contrast  07/30/2014   ADDENDUM REPORT: 07/30/2014 12:48  ADDENDUM: Dental caries are also present over the first lower molar teeth bilaterally as well as the most posterior bilateral upper molar teeth. There is a fluid collection adjacent the right body of the mandible measuring 0.5 x 2.3 cm in transverse in AP dimensions likely a small abscess of dental origin. Moderate adjacent subcutaneous inflammation. No significant periapical lucency/ periodontal disease is evident.  Findings called to Dr. Butler Denmarkizwan at the time dictation.   Electronically Signed   By: Elberta Fortisaniel  Boyle M.D.   On: 07/30/2014 12:48   07/30/2014   CLINICAL DATA:  Dental abscess to right lower molar. 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 Evaluate extent of the abscess. Dental pain, swelling onset this week.  EXAM: CT NECK WITH CONTRAST  TECHNIQUE: Multidetector CT imaging of the neck was performed using the standard protocol following the bolus administration of intravenous contrast.  CONTRAST:  75mL OMNIPAQUE IOHEXOL 300 MG/ML  SOLN  COMPARISON:  None.  FINDINGS: Pharynx and larynx: Prominent adenoidal soft tissue. Prominent tonsillar hypertrophy, right greater than left. No fluid collection. There is no retropharyngeal effusion, fluid collection or abscess. The epiglottis is normal. Diffuse soft tissue stranding about the right face and neck without subcutaneous fluid collection.  Salivary glands: No inflammation.  Thyroid: Normal.  Lymph nodes:  Prominent cervical lymph nodes, right greater than left, likely reactive.  Vascular: Patent, no thrombosis or inflammation.  Limited intracranial: Normal.  Visualized orbits: Normal.  Mastoids and visualized paranasal sinuses: Dental carie of the right upper molar without periapical lucency or abscess. No periapical lucencies about any tooth. Mild mucosal thickening of the left maxillary sinus, paranasal sinuses are otherwise clear. Mastoids are clear.  Skeleton: Normal cervical spine alignment.  Upper chest: Included mediastinum is normal. No mediastinal adenopathy or fluid collection. No mediastinal inflammation. The lung apices are clear, no consolidation.  IMPRESSION: 1. Dental carie of the right upper molar. No definite CT abnormality of the right lower molars. There is soft tissue stranding about the right face and upper neck. No periapical or soft tissue abscess. No inflammatory extension to the mediastinum or retropharyngeal soft tissues. 2. Prominent cervical lymph nodes, right greater than left are  likely reactive. Prominence of the palatine tonsils and adenoids, also likely reactive.  Electronically Signed: By: Rubye Oaks M.D. On: 07/29/2014 21:32    Scheduled Meds:  Scheduled Meds: . ampicillin-sulbactam (UNASYN) IV  3 g Intravenous Q6H  . heparin  5,000 Units Subcutaneous 3 times per day  . oxyCODONE-acetaminophen  1 tablet Oral Q6H   Continuous Infusions:   Time spent on care of this patient: 35 min   Oktober Glazer, MD 07/31/2014, 11:53 AM  LOS: 1 day   [facility] Office  [phone] Pager - Text Page per [email or web address] If 7PM-7AM, please contact night-coverage [email or web address]

## 2014-07-31 NOTE — Care Management Note (Signed)
Case Management Note  Patient Details  Name: Annice Pihyren Hagner MRN: [id number] Date of Birth: 05/23/1980  Subjective/Objective:                    Action/Plan: Consent to find patient dentist .   Made multiple phone calls .   [facility] only [facility] to people 32 years old  and under and females who are pregnant .   [facility] is dental hygiene clinic only .   All dentist are cash up front .   Referred to [facility] , [address] , [place] [id number] phone [phone] by Health Department .   Made patient an appointment at [facility] for Tuesday August 05, 2014 at 0945 am , however , cash only patient needs to have $195.00 up front .  [facility] referral [facility] to [facility] [address] , [place] , [place] [address] , phone [phone] . NCM was quoted from exam and xray cost would be $61.00.  Explained above to patient . He will discuss with family and friends to see if he can "come up with " $195 or a ride to [place] .   Provided information regarding both clinics to patient . Also provided information on [facility] and [facility] . Will provide MATCH letter on day of discharge to assist with antibiotics .   Ronny FlurryHeather Felica Chargois RN BSN    Expected Discharge Date:  08/02/14               Expected Discharge Plan:  Home/Self Care  In-House Referral:     Discharge planning Services  CM Consult, Indigent Health Clinic, Medication Assistance, [facility]  Post Acute Care Choice:    Choice offered to:     DME Arranged:    DME Agency:     HH Arranged:    HH Agency:     Status of Service:  In process, will continue to follow  Medicare Important Message Given:    Date Medicare IM Given:    Medicare IM give by:    Date Additional Medicare IM Given:    Additional Medicare Important Message give by:     If discussed at Long Length of Stay Meetings, dates discussed:    Additional  Comments:  Kingsley PlanWile, Jessyca Sloan Marie, RN 07/31/2014, 10:21 AM

## 2014-08-01 LAB — CBC
HEMATOCRIT: 42.9 % (ref 39.0–52.0)
Hemoglobin: 14.5 g/dL (ref 13.0–17.0)
MCH: 31.5 pg (ref 26.0–34.0)
MCHC: 33.8 g/dL (ref 30.0–36.0)
MCV: 93.3 fL (ref 78.0–100.0)
Platelets: 250 10*3/uL (ref 150–400)
RBC: 4.6 MIL/uL (ref 4.22–5.81)
RDW: 13.5 % (ref 11.5–15.5)
WBC: 14 10*3/uL — ABNORMAL HIGH (ref 4.0–10.5)

## 2014-08-01 MED ORDER — BISACODYL 5 MG PO TBEC
10.0000 mg | DELAYED_RELEASE_TABLET | Freq: Once | ORAL | Status: AC
  Administered 2014-08-01: 10 mg via ORAL
  Filled 2014-08-01: qty 2

## 2014-08-01 NOTE — Care Management Note (Addendum)
Case Management Note  Patient Details  Name: Annice Pihyren Terpening MRN: [id number] Date of Birth: 03/10/1980  Subjective/Objective:                    Action/Plan:  No call back yet . Patient has contact information.   Still have not heard back from Dr Luretha MurphyKulinski's office . DR Jensen's office closed .   Left message at DR Charlynne Panderonald F. Kulinski office to schedule post discharge appointment . Awaiting call back .   [address]  [place] , [place] [address] , Phone number  [phone]    [facility] given and explained   [facility] August 12, 2014 at 3:30 pm . [facility].  Expected Discharge Date:  08/02/14               Expected Discharge Plan:  Home/Self Care  In-House Referral:     Discharge planning Services  CM Consult, Indigent Health Clinic, Medication Assistance, [facility]  Post Acute Care Choice:    Choice offered to:     DME Arranged:    DME Agency:     HH Arranged:    HH Agency:     Status of Service:  In process, will continue to follow  Medicare Important Message Given:    Date Medicare IM Given:    Medicare IM give by:    Date Additional Medicare IM Given:    Additional Medicare Important Message give by:     If discussed at Long Length of Stay Meetings, dates discussed:    Additional Comments:  Kingsley PlanWile, Eutimio Gharibian Marie, RN 08/01/2014, 10:02 AM

## 2014-08-01 NOTE — Progress Notes (Signed)
[facility] Progress Note   Frank Warner ZOX:[id number] DOB: 07/20/1980 DOA: 07/29/2014 PCP: No PCP Per Patient  Brief narrative: Frank Warner is a 34 y.o. male who presents to the ER for right 1st molar pain, swelling of right face and jaw. Initial CT scan did not reveal an abscess but I have just received a call from the radiologist, Dr. Lindi Adie, this morning reviewed the CT and feels that he has a right mandibular subperiosteal abscess measuring 0.5 x 2.3 cm.  Subjective: Swelling and pain in right jaw continues to improve. Tolerating soft food.   Assessment/Plan: Principal Problem:   Odontogenic infection of jaw/abscess and cellulitis right face - we have NO DENTAL COVERAGE at [facility]  - Dental caries-abscess noted as above- contacted Dr. Robin Searing (dental surgeon) on 6/1 who was not on call  and will not available until Monday- he recommends to continue antibiotics and f/u with dental as outpt- if patient is still here on Monday, he will see the patient.  -as we had no official dental coverage, I did speak with ENT, Dr Jearld Fenton, in regards to the above mentioned abscess- he stated that he is not going to see the patient and recommended Dental surgery to see the patient - WBC count was 29 on admission- steadily improving - still has a considerable amount of swelling in the jaw although improved from day of admission - Continue Unasyn - cont Dilaudid and hydrocodone to help control pain - continue soft diet   Code Status: Full code Family Communication:  Disposition Plan: attempt to make outpt dental appt and also get an appt with [facility]- case management will work on a match letter to help him obtain his antibiotics DVT prophylaxis: Heparin Consultants: ENT/dental surgery-  phone call only  Procedures:  Antibiotics: Anti-infectives    Start     Dose/Rate Route Frequency Ordered Stop   07/29/14 2359  Ampicillin-Sulbactam (UNASYN) 3 g in sodium chloride 0.9 % 100  mL IVPB     3 g 100 mL/hr over 60 Minutes Intravenous Every 6 hours 07/29/14 2329     07/29/14 2015  clindamycin (CLEOCIN) IVPB 600 mg     600 mg 100 mL/hr over 30 Minutes Intravenous  Once 07/29/14 2003 07/29/14 2248      Objective: Filed Weights   07/30/14 0148  Weight: 97.2 kg (214 lb 4.6 oz)    Intake/Output Summary (Last 24 hours) at 08/01/14 0853 Last data filed at 08/01/14 0534  Gross per 24 hour  Intake    820 ml  Output      0 ml  Net    820 ml     Vitals Filed Vitals:   07/31/14 0621 07/31/14 1410 07/31/14 2247 08/01/14 0531  BP: 133/69 123/78 136/83 138/80  Pulse: 57 70 60 63  Temp: 98.8 F (37.1 C) 98.6 F (37 C) 98.2 F (36.8 C) 98.6 F (37 C)  TempSrc: Oral Oral Oral Oral  Resp: Height:      Weight:      SpO2: 99% 100% 98% 100%    Exam:  General:  Pt is alert, not in acute distress  HEENT: No icterus, No thrush, oral mucosa moist- improving swelling of right mandibular and maxillary area with tenderness  Cardiovascular: regular rate and rhythm, S1/S2 No murmur  Respiratory: clear to auscultation bilaterally   Abdomen: Soft, +Bowel sounds, non tender, non distended, no guarding  MSK: No LE edema, cyanosis or clubbing  Data  Reviewed: Basic Metabolic Panel:  Recent Labs Lab 07/29/14 2155  NA 134*  K 4.2  CL 99*  CO2 24  GLUCOSE 108*  BUN <5*  CREATININE 1.23  CALCIUM 9.1   Liver Function Tests: No results for input(s): AST, ALT, ALKPHOS, BILITOT, PROT, ALBUMIN in the last 168 hours. No results for input(s): LIPASE, AMYLASE in the last 168 hours. No results for input(s): AMMONIA in the last 168 hours. CBC:  Recent Labs Lab 07/29/14 2044 07/31/14 0351 08/01/14 0545  WBC 29.5* 20.2* 14.0*  NEUTROABS 25.0*  --   --   HGB 16.5 14.9 14.5  HCT 46.6 44.7 42.9  MCV 91.9 94.5 93.3  PLT 226 213 250   Cardiac Enzymes: No results for input(s): CKTOTAL, CKMB, CKMBINDEX, TROPONINI in the last 168 hours. BNP (last 3  results) No results for input(s): BNP in the last 8760 hours.  ProBNP (last 3 results) No results for input(s): PROBNP in the last 8760 hours.  CBG: No results for input(s): GLUCAP in the last 168 hours.  No results found for this or any previous visit (from the past 240 hour(s)).   Studies: Dg Orthopantogram  07/30/2014   CLINICAL DATA:  Swelling to right jawline/toothache since yesterday.  EXAM: ORTHOPANTOGRAM/PANORAMIC  COMPARISON:  Soft tissue neck CT today.  FINDINGS: Bony structures are within normal. No significant periodontal disease. Bilateral upper and lower dental caries.  IMPRESSION: No focal bony abnormality and no significant periodontal disease. Several dental caries.   Electronically Signed   By: Elberta Fortisaniel  Boyle M.D.   On: 07/30/2014 12:24    Scheduled Meds:  Scheduled Meds: . ampicillin-sulbactam (UNASYN) IV  3 g Intravenous Q6H  . heparin  5,000 Units Subcutaneous 3 times per day  . oxyCODONE-acetaminophen  1 tablet Oral Q6H   Continuous Infusions:   Time spent on care of this patient: 35 min   Amanda Pote, MD 08/01/2014, 8:53 AM  LOS: 2 days   [facility] Office  [phone] Pager - Text Page per [email or web address] If 7PM-7AM, please contact night-coverage [email or web address]

## 2014-08-02 LAB — CBC
HCT: 44.5 % (ref 39.0–52.0)
Hemoglobin: 15.3 g/dL (ref 13.0–17.0)
MCH: 31.7 pg (ref 26.0–34.0)
MCHC: 34.4 g/dL (ref 30.0–36.0)
MCV: 92.1 fL (ref 78.0–100.0)
PLATELETS: 214 10*3/uL (ref 150–400)
RBC: 4.83 MIL/uL (ref 4.22–5.81)
RDW: 13.5 % (ref 11.5–15.5)
WBC: 10.9 10*3/uL — ABNORMAL HIGH (ref 4.0–10.5)

## 2014-08-02 MED ORDER — SODIUM CHLORIDE 0.9 % IV SOLN
3.0000 g | Freq: Four times a day (QID) | INTRAVENOUS | Status: DC
Start: 2014-08-02 — End: 2014-08-03
  Administered 2014-08-02 (×3): 3 g via INTRAVENOUS
  Filled 2014-08-02 (×5): qty 3

## 2014-08-02 MED ORDER — AMOXICILLIN-POT CLAVULANATE 875-125 MG PO TABS: 1.0000 | ORAL_TABLET | Freq: Two times a day (BID) | ORAL | Status: DC

## 2014-08-02 NOTE — Progress Notes (Signed)
Pt a/o, c/o mouth pain, PRN dilaudid given as ordered, vss, pt stable

## 2014-08-02 NOTE — Progress Notes (Signed)
[facility] Progress Note   Frank Pihyren Prabhakar ZOX:[id number] DOB: 11/30/1980 DOA: 07/29/2014 PCP: No PCP Per Patient  Brief narrative: Frank Warner is a 34 y.o. male who presents to the ER for right 1st molar pain, swelling of right face and jaw. Initial CT scan did not reveal an abscess but I have just received a call from the radiologist, Dr. Lindi Adiean Boyle, this morning reviewed the CT and feels that he has a right mandibular subperiosteal abscess measuring 0.5 x 2.3 cm.  Subjective: Continues to have swelling and pain in the jaw today-having some trouble chewing still.  Assessment/Plan: Principal Problem:   Odontogenic infection of jaw/abscess and cellulitis right face - [facility]  - Dental caries-abscess noted as above- contacted Dr. Robin SearingKulinsky (dental surgeon) on 6/1 who was not on call  and will not available until Monday- he recommends to continue antibiotics and f/u with dental as outpt- if patient is still here on Monday, he will see the patient.  -as we had no official dental coverage, I did speak with ENT, Dr Jearld FentonByers, in regards to the above mentioned abscess- he stated that he is not going to see the patient and recommended Dental surgery to see the patient - WBC count was 29 on admission- steadily improving - still has a considerable amount of swelling in the jaw and in his gums although improved from day of admission - Continue Unasyn - cont Dilaudid and hydrocodone to help control pain - continue soft diet   Code Status: Full code Family Communication:  Disposition Plan: attempt made by case management to obtain outpt dental appt however, she was not able to get an appointment- case management will work on a match letter to help him obtain his antibiotics DVT prophylaxis: Heparin Consultants: ENT/dental surgery-  phone call only  Procedures:  Antibiotics: Anti-infectives    Start     Dose/Rate Route Frequency Ordered Stop   08/02/14 1000   amoxicillin-clavulanate (AUGMENTIN) 875-125 MG per tablet 1 tablet  Status:  Discontinued     1 tablet Oral 2 times daily 08/02/14 0715 08/02/14 0945   08/02/14 1000  Ampicillin-Sulbactam (UNASYN) 3 g in sodium chloride 0.9 % 100 mL IVPB     3 g 100 mL/hr over 60 Minutes Intravenous Every 6 hours 08/02/14 0945     07/29/14 2359  Ampicillin-Sulbactam (UNASYN) 3 g in sodium chloride 0.9 % 100 mL IVPB  Status:  Discontinued     3 g 100 mL/hr over 60 Minutes Intravenous Every 6 hours 07/29/14 2329 08/02/14 0715   07/29/14 2015  clindamycin (CLEOCIN) IVPB 600 mg     600 mg 100 mL/hr over 30 Minutes Intravenous  Once 07/29/14 2003 07/29/14 2248      Objective: Filed Weights   07/30/14 0148  Weight: 97.2 kg (214 lb 4.6 oz)    Intake/Output Summary (Last 24 hours) at 08/02/14 1322 Last data filed at 08/02/14 0452  Gross per 24 hour  Intake    562 ml  Output      0 ml  Net    562 ml     Vitals Filed Vitals:   08/01/14 0531 08/01/14 1451 08/01/14 2112 08/02/14 0554  BP: 138/80 123/75 128/83 134/68  Pulse: 63 48 58 54  Temp: 98.6 F (37 C) 97.8 F (36.6 C) 97.9 F (36.6 C) 98.2 F (36.8 C)  TempSrc: Oral Oral Oral Oral  Resp: 16 16 17 17   Height:      Weight:  SpO2: 100% 100% 100% 99%    Exam:  General:  Pt is alert, not in acute distress  HEENT: No icterus, No thrush, oral mucosa moist- swelling of right mandibular and maxillary area with tenderness, swelling and gums in right lower jaw  Cardiovascular: regular rate and rhythm, S1/S2 No murmur  Respiratory: clear to auscultation bilaterally   Abdomen: Soft, +Bowel sounds, non tender, non distended, no guarding  MSK: No LE edema, cyanosis or clubbing  Data Reviewed: Basic Metabolic Panel:  Recent Labs Lab 07/29/14 2155  NA 134*  K 4.2  CL 99*  CO2 24  GLUCOSE 108*  BUN <5*  CREATININE 1.23  CALCIUM 9.1   Liver Function Tests: No results for input(s): AST, ALT, ALKPHOS, BILITOT, PROT, ALBUMIN in the  last 168 hours. No results for input(s): LIPASE, AMYLASE in the last 168 hours. No results for input(s): AMMONIA in the last 168 hours. CBC:  Recent Labs Lab 07/29/14 2044 07/31/14 0351 08/01/14 0545 08/02/14 0410  WBC 29.5* 20.2* 14.0* 10.9*  NEUTROABS 25.0*  --   --   --   HGB 16.5 14.9 14.5 15.3  HCT 46.6 44.7 42.9 44.5  MCV 91.9 94.5 93.3 92.1  PLT 226 213 250 214   Cardiac Enzymes: No results for input(s): CKTOTAL, CKMB, CKMBINDEX, TROPONINI in the last 168 hours. BNP (last 3 results) No results for input(s): BNP in the last 8760 hours.  ProBNP (last 3 results) No results for input(s): PROBNP in the last 8760 hours.  CBG: No results for input(s): GLUCAP in the last 168 hours.  No results found for this or any previous visit (from the past 240 hour(s)).   Studies: No results found.  Scheduled Meds:  Scheduled Meds: . ampicillin-sulbactam (UNASYN) IV  3 g Intravenous Q6H  . heparin  5,000 Units Subcutaneous 3 times per day  . oxyCODONE-acetaminophen  1 tablet Oral Q6H   Continuous Infusions:   Time spent on care of this patient: 35 min   Effa Yarrow, MD 08/02/2014, 1:22 PM  LOS: 3 days   [facility] Office  [phone] Pager - Text Page per [email or web address] If 7PM-7AM, please contact night-coverage [email or web address]

## 2014-08-03 LAB — CBC
HCT: 42.4 % (ref 39.0–52.0)
Hemoglobin: 14.4 g/dL (ref 13.0–17.0)
MCH: 31.6 pg (ref 26.0–34.0)
MCHC: 34 g/dL (ref 30.0–36.0)
MCV: 93.2 fL (ref 78.0–100.0)
PLATELETS: 250 10*3/uL (ref 150–400)
RBC: 4.55 MIL/uL (ref 4.22–5.81)
RDW: 13.3 % (ref 11.5–15.5)
WBC: 12.4 10*3/uL — ABNORMAL HIGH (ref 4.0–10.5)

## 2014-08-03 MED ORDER — HYDROMORPHONE HCL 1 MG/ML IJ SOLN
0.5000 mg | Freq: Once | INTRAMUSCULAR | Status: AC
  Administered 2014-08-03: 0.5 mg via INTRAVENOUS

## 2014-08-03 MED ORDER — SODIUM CHLORIDE 0.9 % IV SOLN
3.0000 g | Freq: Four times a day (QID) | INTRAVENOUS | Status: DC
  Administered 2014-08-03 – 2014-08-06 (×13): 3 g via INTRAVENOUS
  Filled 2014-08-03 (×15): qty 3

## 2014-08-03 NOTE — Progress Notes (Signed)
[facility] Progress Note   Frank Warner ZOX:[id number] DOB: 08/07/1980 DOA: 07/29/2014 PCP: No PCP Per Patient  Brief narrative: Frank Warner is a 34 y.o. male who presents to the ER for right 1st molar pain, swelling of right face and jaw. Initial CT scan did not reveal an abscess but I have just received a call from the radiologist, Dr. Lindi Adiean Boyle, this morning reviewed the CT and feels that he has a right mandibular subperiosteal abscess measuring 0.5 x 2.3 cm.  Subjective: Continues to have swelling and pain in the jaw today-  Assessment/Plan: Principal Problem:   Odontogenic infection of jaw/abscess and cellulitis right face - [facility]  - Dental caries-abscess noted as above- contacted Dr. Robin SearingKulinsky (dental surgeon) on 6/1 who was not on call  and will not available until Monday- he recommends to continue antibiotics and f/u with dental as outpt- if patient is still here on Monday, he will see the patient.  -as we had no official dental coverage, I did speak with ENT, Dr Jearld FentonByers, in regards to the above mentioned abscess- he stated that he is not going to see the patient and recommended Dental surgery to see the patient - WBC count was 29 on admission- steadily improving - still has a considerable amount of swelling in the jaw and in his gums although improved from day of admission - Continue Unasyn - cont Dilaudid and hydrocodone to help control pain - continue soft diet   Code Status: Full code Family Communication:  Disposition Plan: attempt made by case management to obtain outpt dental appt however, she was not able to get an appointment- case management will work on a match letter to help him obtain his antibiotics DVT prophylaxis: Heparin Consultants: ENT/dental surgery-  phone call only  Procedures:  Antibiotics: Anti-infectives    Start     Dose/Rate Route Frequency Ordered Stop   08/03/14 0900  Ampicillin-Sulbactam (UNASYN) 3 g in  sodium chloride 0.9 % 100 mL IVPB     3 g 100 mL/hr over 60 Minutes Intravenous 4 times per day 08/03/14 0157     08/02/14 1000  amoxicillin-clavulanate (AUGMENTIN) 875-125 MG per tablet 1 tablet  Status:  Discontinued     1 tablet Oral 2 times daily 08/02/14 0715 08/02/14 0945   08/02/14 1000  Ampicillin-Sulbactam (UNASYN) 3 g in sodium chloride 0.9 % 100 mL IVPB  Status:  Discontinued     3 g 100 mL/hr over 60 Minutes Intravenous Every 6 hours 08/02/14 0945 08/03/14 0157   07/29/14 2359  Ampicillin-Sulbactam (UNASYN) 3 g in sodium chloride 0.9 % 100 mL IVPB  Status:  Discontinued     3 g 100 mL/hr over 60 Minutes Intravenous Every 6 hours 07/29/14 2329 08/02/14 0715   07/29/14 2015  clindamycin (CLEOCIN) IVPB 600 mg     600 mg 100 mL/hr over 30 Minutes Intravenous  Once 07/29/14 2003 07/29/14 2248      Objective: Filed Weights   07/30/14 0148  Weight: 97.2 kg (214 lb 4.6 oz)    Intake/Output Summary (Last 24 hours) at 08/03/14 1137 Last data filed at 08/03/14 0533  Gross per 24 hour  Intake    780 ml  Output      0 ml  Net    780 ml     Vitals Filed Vitals:   08/02/14 0554 08/02/14 1420 08/02/14 2142 08/03/14 0524  BP: 134/68 127/64 120/90 127/76  Pulse: 54 68 99 53  Temp: 98.2 F (36.8 C) 98.2 F (36.8 C) 98.3 F (36.8 C) 98.3 F (36.8 C)  TempSrc: Oral Oral Oral Oral  Resp: Height:      Weight:      SpO2: 99% 97% 97% 97%    Exam:  General:  Pt is alert, not in acute distress  HEENT: No icterus, No thrush, oral mucosa moist- swelling of right mandibular and maxillary area with tenderness, swelling and gums in right lower jaw  Cardiovascular: regular rate and rhythm, S1/S2 No murmur  Respiratory: clear to auscultation bilaterally   Abdomen: Soft, +Bowel sounds, non tender, non distended, no guarding  MSK: No LE edema, cyanosis or clubbing  Data Reviewed: Basic Metabolic Panel:  Recent Labs Lab 07/29/14 2155  NA 134*  K 4.2  CL 99*   CO2 24  GLUCOSE 108*  BUN <5*  CREATININE 1.23  CALCIUM 9.1   Liver Function Tests: No results for input(s): AST, ALT, ALKPHOS, BILITOT, PROT, ALBUMIN in the last 168 hours. No results for input(s): LIPASE, AMYLASE in the last 168 hours. No results for input(s): AMMONIA in the last 168 hours. CBC:  Recent Labs Lab 07/29/14 2044 07/31/14 0351 08/01/14 0545 08/02/14 0410 08/03/14 0500  WBC 29.5* 20.2* 14.0* 10.9* 12.4*  NEUTROABS 25.0*  --   --   --   --   HGB 16.5 14.9 14.5 15.3 14.4  HCT 46.6 44.7 42.9 44.5 42.4  MCV 91.9 94.5 93.3 92.1 93.2  PLT 226 213 250 214 250   Cardiac Enzymes: No results for input(s): CKTOTAL, CKMB, CKMBINDEX, TROPONINI in the last 168 hours. BNP (last 3 results) No results for input(s): BNP in the last 8760 hours.  ProBNP (last 3 results) No results for input(s): PROBNP in the last 8760 hours.  CBG: No results for input(s): GLUCAP in the last 168 hours.  No results found for this or any previous visit (from the past 240 hour(s)).   Studies: No results found.  Scheduled Meds:  Scheduled Meds: . ampicillin-sulbactam (UNASYN) IV  3 g Intravenous 4 times per day  . heparin  5,000 Units Subcutaneous 3 times per day  . oxyCODONE-acetaminophen  1 tablet Oral Q6H   Continuous Infusions:   Time spent on care of this patient: 35 min   Grier Czerwinski, MD 08/03/2014, 11:37 AM  LOS: 4 days   [facility] Office  [phone] Pager - Text Page per [email or web address] If 7PM-7AM, please contact night-coverage [email or web address]

## 2014-08-04 ENCOUNTER — Encounter (HOSPITAL_COMMUNITY): Payer: Self-pay | Admitting: Dentistry

## 2014-08-04 DIAGNOSIS — K047 Periapical abscess without sinus: Secondary | ICD-10-CM

## 2014-08-04 DIAGNOSIS — K045 Chronic apical periodontitis: Secondary | ICD-10-CM

## 2014-08-04 DIAGNOSIS — K040 Pulpitis: Secondary | ICD-10-CM

## 2014-08-04 LAB — CBC
HCT: 43.1 % (ref 39.0–52.0)
Hemoglobin: 14.6 g/dL (ref 13.0–17.0)
MCH: 31.5 pg (ref 26.0–34.0)
MCHC: 33.9 g/dL (ref 30.0–36.0)
MCV: 93.1 fL (ref 78.0–100.0)
Platelets: 272 10*3/uL (ref 150–400)
RBC: 4.63 MIL/uL (ref 4.22–5.81)
RDW: 13.4 % (ref 11.5–15.5)
WBC: 12 10*3/uL — AB (ref 4.0–10.5)

## 2014-08-04 MED ORDER — OXYCODONE-ACETAMINOPHEN 5-325 MG PO TABS: 1.0000 | ORAL_TABLET | Freq: Four times a day (QID) | ORAL | Status: DC

## 2014-08-04 MED ORDER — OXYCODONE-ACETAMINOPHEN 5-325 MG PO TABS
1.0000 | ORAL_TABLET | ORAL | Status: DC
  Administered 2014-08-04 – 2014-08-05 (×4): 2 via ORAL
  Administered 2014-08-05: 1 via ORAL
  Administered 2014-08-05 – 2014-08-06 (×4): 2 via ORAL
  Filled 2014-08-04 (×10): qty 2

## 2014-08-04 MED ORDER — AMOXICILLIN-POT CLAVULANATE 875-125 MG PO TABS: 1.0000 | ORAL_TABLET | Freq: Two times a day (BID) | ORAL | Status: DC

## 2014-08-04 NOTE — Progress Notes (Addendum)
[facility] Progress Note   Freddy Kinne JXB:[id number] DOB: 01-15-1981 DOA: 07/29/2014 PCP: No PCP Per Patient  Brief narrative: Frank Warner is a 34 y.o. male who presents to the ER for right 1st molar pain, swelling of right face and jaw. Initial CT scan did not reveal an abscess but I have just received a call from the radiologist, Dr. Lindi Adie, this morning reviewed the CT and feels that he has a right mandibular subperiosteal abscess measuring 0.5 x 2.3 cm.  Subjective: Continues to have swelling and pain in the jaw today- still feels some discharge from the gums.   Assessment/Plan: Principal Problem:   Odontogenic infection of jaw/abscess and cellulitis right face - we have NO DENTAL COVERAGE at [facility]  - Dental caries-abscess noted as above- contacted Dr. Robin Searing (dental surgeon) on 6/1 who was not on call  and will not available until Monday- he recommends to continue antibiotics and f/u with dental as outpt- if patient is still here on Monday, he will see the patient.  -as we had no official dental coverage, I did speak with ENT, Dr Jearld Fenton, in regards to the above mentioned abscess- he stated that he is not going to see the patient and recommended Dental surgery to see the patient - WBC count was 29 on admission- steadily improving - still has a considerable amount of swelling in the jaw and in his gums although improved from day of admission - Continue Unasyn - cont Dilaudid and hydrocodone to help control pain - continue soft diet  - evaluated by Dr Robin Searing- will go to OR tomorrow  Code Status: Full code Family Communication:  Disposition Plan: attempt made by case management to obtain outpt dental appt however, she was not able to get an appointment- case management will work on a match letter to help him obtain his antibiotics DVT prophylaxis: Heparin Consultants: ENT/dental surgery-  phone call only  Procedures:  Antibiotics: Anti-infectives    Start      Dose/Rate Route Frequency Ordered Stop   08/03/14 0900  Ampicillin-Sulbactam (UNASYN) 3 g in sodium chloride 0.9 % 100 mL IVPB     3 g 100 mL/hr over 60 Minutes Intravenous 4 times per day 08/03/14 0157     08/02/14 1000  amoxicillin-clavulanate (AUGMENTIN) 875-125 MG per tablet 1 tablet  Status:  Discontinued     1 tablet Oral 2 times daily 08/02/14 0715 08/02/14 0945   08/02/14 1000  Ampicillin-Sulbactam (UNASYN) 3 g in sodium chloride 0.9 % 100 mL IVPB  Status:  Discontinued     3 g 100 mL/hr over 60 Minutes Intravenous Every 6 hours 08/02/14 0945 08/03/14 0157   07/29/14 2359  Ampicillin-Sulbactam (UNASYN) 3 g in sodium chloride 0.9 % 100 mL IVPB  Status:  Discontinued     3 g 100 mL/hr over 60 Minutes Intravenous Every 6 hours 07/29/14 2329 08/02/14 0715   07/29/14 2015  clindamycin (CLEOCIN) IVPB 600 mg     600 mg 100 mL/hr over 30 Minutes Intravenous  Once 07/29/14 2003 07/29/14 2248      Objective: Filed Weights   07/30/14 0148  Weight: 97.2 kg (214 lb 4.6 oz)    Intake/Output Summary (Last 24 hours) at 08/04/14 1146 Last data filed at 08/04/14 0522  Gross per 24 hour  Intake   1260 ml  Output      0 ml  Net   1260 ml     Vitals Filed Vitals:   08/03/14 0524 08/03/14 1415 08/03/14  2203 08/04/14 0510  BP: 127/76 113/60 147/70 134/93  Pulse: 53 69 63 64  Temp: 98.3 F (36.8 C) 97.8 F (36.6 C) 98.4 F (36.9 C) 98.6 F (37 C)  TempSrc: Oral Oral Oral Oral  Resp: 16 16 17 17   Height:      Weight:      SpO2: 97% 97% 100% 100%    Exam:  General:  Pt is alert, not in acute distress  HEENT: No icterus, No thrush, oral mucosa moist- swelling of right mandibular and maxillary area with tenderness, swelling and gums in right lower jaw  Cardiovascular: regular rate and rhythm, S1/S2 No murmur  Respiratory: clear to auscultation bilaterally   Abdomen: Soft, +Bowel sounds, non tender, non distended, no guarding  MSK: No LE edema, cyanosis or clubbing  Data  Reviewed: Basic Metabolic Panel:  Recent Labs Lab 07/29/14 2155  NA 134*  K 4.2  CL 99*  CO2 24  GLUCOSE 108*  BUN <5*  CREATININE 1.23  CALCIUM 9.1   Liver Function Tests: No results for input(s): AST, ALT, ALKPHOS, BILITOT, PROT, ALBUMIN in the last 168 hours. No results for input(s): LIPASE, AMYLASE in the last 168 hours. No results for input(s): AMMONIA in the last 168 hours. CBC:  Recent Labs Lab 07/29/14 2044 07/31/14 0351 08/01/14 0545 08/02/14 0410 08/03/14 0500 08/04/14 0347  WBC 29.5* 20.2* 14.0* 10.9* 12.4* 12.0*  NEUTROABS 25.0*  --   --   --   --   --   HGB 16.5 14.9 14.5 15.3 14.4 14.6  HCT 46.6 44.7 42.9 44.5 42.4 43.1  MCV 91.9 94.5 93.3 92.1 93.2 93.1  PLT 226 213 250 214 250 272   Cardiac Enzymes: No results for input(s): CKTOTAL, CKMB, CKMBINDEX, TROPONINI in the last 168 hours. BNP (last 3 results) No results for input(s): BNP in the last 8760 hours.  ProBNP (last 3 results) No results for input(s): PROBNP in the last 8760 hours.  CBG: No results for input(s): GLUCAP in the last 168 hours.  No results found for this or any previous visit (from the past 240 hour(s)).   Studies: No results found.  Scheduled Meds:  Scheduled Meds: . ampicillin-sulbactam (UNASYN) IV  3 g Intravenous 4 times per day  . heparin  5,000 Units Subcutaneous 3 times per day  . oxyCODONE-acetaminophen  1 tablet Oral Q6H   Continuous Infusions:   Time spent on care of this patient: 35 min   Torre Pikus, MD 08/04/2014, 11:46 AM  LOS: 5 days   [facility] Office  [phone] Pager - Text Page per [email or web address] If 7PM-7AM, please contact night-coverage [email or web address]
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
## 2014-08-05 ENCOUNTER — Inpatient Hospital Stay (HOSPITAL_COMMUNITY): Payer: MEDICAID | Admitting: Certified Registered Nurse Anesthetist

## 2014-08-05 ENCOUNTER — Encounter (HOSPITAL_COMMUNITY)
Admission: EM | Disposition: A | Discharge status: Home / Self Care | Payer: Self-pay | Source: Home / Self Care | Attending: Internal Medicine

## 2014-08-05 ENCOUNTER — Inpatient Hospital Stay (HOSPITAL_COMMUNITY): Payer: Self-pay | Admitting: Certified Registered Nurse Anesthetist

## 2014-08-05 DIAGNOSIS — K047 Periapical abscess without sinus: Secondary | ICD-10-CM

## 2014-08-05 DIAGNOSIS — K040 Pulpitis: Secondary | ICD-10-CM

## 2014-08-05 DIAGNOSIS — K045 Chronic apical periodontitis: Secondary | ICD-10-CM

## 2014-08-05 HISTORY — PX: MULTIPLE EXTRACTIONS WITH ALVEOLOPLASTY: SHX5342

## 2014-08-05 LAB — CBC
HEMATOCRIT: 43.7 % (ref 39.0–52.0)
HEMOGLOBIN: 14.9 g/dL (ref 13.0–17.0)
MCH: 31.7 pg (ref 26.0–34.0)
MCHC: 34.1 g/dL (ref 30.0–36.0)
MCV: 93 fL (ref 78.0–100.0)
Platelets: 303 10*3/uL (ref 150–400)
RBC: 4.7 MIL/uL (ref 4.22–5.81)
RDW: 13.2 % (ref 11.5–15.5)
WBC: 10.7 10*3/uL — ABNORMAL HIGH (ref 4.0–10.5)

## 2014-08-05 LAB — SURGICAL PCR SCREEN
MRSA, PCR: NEGATIVE
Staphylococcus aureus: NEGATIVE

## 2014-08-05 SURGERY — MULTIPLE EXTRACTION WITH ALVEOLOPLASTY
Anesthesia: General

## 2014-08-05 MED ORDER — LIDOCAINE-EPINEPHRINE 2 %-1:100000 IJ SOLN
INTRAMUSCULAR | Status: DC | PRN
  Administered 2014-08-05: 3.4 mL

## 2014-08-05 MED ORDER — ROCURONIUM BROMIDE 50 MG/5ML IV SOLN
INTRAVENOUS | Status: AC
  Filled 2014-08-05: qty 1

## 2014-08-05 MED ORDER — SUCCINYLCHOLINE CHLORIDE 20 MG/ML IJ SOLN
INTRAMUSCULAR | Status: DC | PRN
  Administered 2014-08-05: 100 mg via INTRAVENOUS

## 2014-08-05 MED ORDER — BUPIVACAINE-EPINEPHRINE (PF) 0.5% -1:200000 IJ SOLN
INTRAMUSCULAR | Status: AC
  Filled 2014-08-05: qty 3.6

## 2014-08-05 MED ORDER — HYDROMORPHONE HCL 1 MG/ML IJ SOLN: 0.2500 mg | INTRAMUSCULAR | Status: DC | PRN

## 2014-08-05 MED ORDER — MIDAZOLAM HCL 2 MG/2ML IJ SOLN
INTRAMUSCULAR | Status: AC
  Filled 2014-08-05: qty 2

## 2014-08-05 MED ORDER — PROMETHAZINE HCL 25 MG/ML IJ SOLN: 6.2500 mg | INTRAMUSCULAR | Status: DC | PRN

## 2014-08-05 MED ORDER — LIDOCAINE-EPINEPHRINE 2 %-1:100000 IJ SOLN
INTRAMUSCULAR | Status: AC
  Filled 2014-08-05: qty 10.2

## 2014-08-05 MED ORDER — LIDOCAINE HCL (CARDIAC) 20 MG/ML IV SOLN
INTRAVENOUS | Status: AC
  Filled 2014-08-05: qty 5

## 2014-08-05 MED ORDER — LIDOCAINE HCL (CARDIAC) 20 MG/ML IV SOLN
INTRAVENOUS | Status: DC | PRN
  Administered 2014-08-05: 100 mg via INTRAVENOUS

## 2014-08-05 MED ORDER — PHENYLEPHRINE HCL 10 MG/ML IJ SOLN
INTRAMUSCULAR | Status: DC | PRN
  Administered 2014-08-05: 40 ug via INTRAVENOUS
  Administered 2014-08-05 (×2): 80 ug via INTRAVENOUS

## 2014-08-05 MED ORDER — CEFAZOLIN SODIUM-DEXTROSE 2-3 GM-% IV SOLR
INTRAVENOUS | Status: DC | PRN
  Administered 2014-08-05: 2 g via INTRAVENOUS

## 2014-08-05 MED ORDER — PROPOFOL 10 MG/ML IV BOLUS
INTRAVENOUS | Status: AC
  Filled 2014-08-05: qty 20

## 2014-08-05 MED ORDER — 0.9 % SODIUM CHLORIDE (POUR BTL) OPTIME
TOPICAL | Status: DC | PRN
  Administered 2014-08-05: 300 mL

## 2014-08-05 MED ORDER — LACTATED RINGERS IV SOLN
INTRAVENOUS | Status: DC
  Administered 2014-08-05: 08:00:00 via INTRAVENOUS

## 2014-08-05 MED ORDER — MIDAZOLAM HCL 5 MG/5ML IJ SOLN
INTRAMUSCULAR | Status: DC | PRN
  Administered 2014-08-05: 2 mg via INTRAVENOUS

## 2014-08-05 MED ORDER — MEPERIDINE HCL 25 MG/ML IJ SOLN: 6.2500 mg | INTRAMUSCULAR | Status: DC | PRN

## 2014-08-05 MED ORDER — OXYCODONE HCL 5 MG/5ML PO SOLN: 5.0000 mg | Freq: Once | ORAL | Status: DC | PRN

## 2014-08-05 MED ORDER — PROPOFOL 10 MG/ML IV BOLUS
INTRAVENOUS | Status: DC | PRN
  Administered 2014-08-05: 200 mg via INTRAVENOUS

## 2014-08-05 MED ORDER — OXYMETAZOLINE HCL 0.05 % NA SOLN
NASAL | Status: AC
  Filled 2014-08-05: qty 15

## 2014-08-05 MED ORDER — ONDANSETRON HCL 4 MG/2ML IJ SOLN
INTRAMUSCULAR | Status: AC
  Filled 2014-08-05: qty 2

## 2014-08-05 MED ORDER — FENTANYL CITRATE (PF) 100 MCG/2ML IJ SOLN
INTRAMUSCULAR | Status: DC | PRN
  Administered 2014-08-05: 250 ug via INTRAVENOUS

## 2014-08-05 MED ORDER — FENTANYL CITRATE (PF) 250 MCG/5ML IJ SOLN
INTRAMUSCULAR | Status: AC
  Filled 2014-08-05: qty 5

## 2014-08-05 MED ORDER — LABETALOL HCL 5 MG/ML IV SOLN
INTRAVENOUS | Status: DC | PRN
  Administered 2014-08-05: 10 mg via INTRAVENOUS

## 2014-08-05 MED ORDER — OXYCODONE HCL 5 MG PO TABS: 5.0000 mg | ORAL_TABLET | Freq: Once | ORAL | Status: DC | PRN

## 2014-08-05 MED ORDER — LACTATED RINGERS IV SOLN: INTRAVENOUS | Status: DC

## 2014-08-05 SURGICAL SUPPLY — 34 items
ALCOHOL 70% 16 OZ (MISCELLANEOUS) ×3 IMPLANT
ATTRACTOMAT 16X20 MAGNETIC DRP (DRAPES) ×3 IMPLANT
BLADE SURG 15 STRL LF DISP TIS (BLADE) ×2 IMPLANT
BLADE SURG 15 STRL SS (BLADE) ×6
COVER SURGICAL LIGHT HANDLE (MISCELLANEOUS) ×3 IMPLANT
GAUZE PACKING FOLDED 2  STR (GAUZE/BANDAGES/DRESSINGS) ×2
GAUZE PACKING FOLDED 2 STR (GAUZE/BANDAGES/DRESSINGS) ×1 IMPLANT
GAUZE SPONGE 4X4 16PLY XRAY LF (GAUZE/BANDAGES/DRESSINGS) ×3 IMPLANT
GLOVE BIOGEL PI IND STRL 6 (GLOVE) ×1 IMPLANT
GLOVE BIOGEL PI INDICATOR 6 (GLOVE) ×2
GLOVE SURG ORTHO 8.0 STRL STRW (GLOVE) ×3 IMPLANT
GLOVE SURG SS PI 6.0 STRL IVOR (GLOVE) ×3 IMPLANT
GOWN STRL REUS W/ TWL LRG LVL3 (GOWN DISPOSABLE) ×1 IMPLANT
GOWN STRL REUS W/TWL 2XL LVL3 (GOWN DISPOSABLE) ×3 IMPLANT
GOWN STRL REUS W/TWL LRG LVL3 (GOWN DISPOSABLE) ×3
HEMOSTAT SURGICEL 2X14 (HEMOSTASIS) ×3 IMPLANT
KIT BASIN OR (CUSTOM PROCEDURE TRAY) ×3 IMPLANT
KIT ROOM TURNOVER OR (KITS) ×3 IMPLANT
MANIFOLD NEPTUNE WASTE (CANNULA) ×3 IMPLANT
NDL BLUNT 16X1.5 OR ONLY (NEEDLE) ×1 IMPLANT
NEEDLE BLUNT 16X1.5 OR ONLY (NEEDLE) ×3 IMPLANT
NS IRRIG 1000ML POUR BTL (IV SOLUTION) ×3 IMPLANT
PACK EENT II TURBAN DRAPE (CUSTOM PROCEDURE TRAY) ×3 IMPLANT
PAD ARMBOARD 7.5X6 YLW CONV (MISCELLANEOUS) ×3 IMPLANT
SPONGE SURGIFOAM ABS GEL 100 (HEMOSTASIS) IMPLANT
SPONGE SURGIFOAM ABS GEL 12-7 (HEMOSTASIS) IMPLANT
SPONGE SURGIFOAM ABS GEL SZ50 (HEMOSTASIS) IMPLANT
SUCTION FRAZIER TIP 10 FR DISP (SUCTIONS) ×3 IMPLANT
SUT CHROMIC 3 0 PS 2 (SUTURE) ×6 IMPLANT
SYR 50ML SLIP (SYRINGE) ×3 IMPLANT
TOWEL OR 17X26 10 PK STRL BLUE (TOWEL DISPOSABLE) ×3 IMPLANT
TUBE CONNECTING 12'X1/4 (SUCTIONS) ×1
TUBE CONNECTING 12X1/4 (SUCTIONS) ×2 IMPLANT
YANKAUER SUCT BULB TIP NO VENT (SUCTIONS) ×3 IMPLANT

## 2014-08-05 NOTE — Progress Notes (Signed)
[facility] Progress Note   Frank Pihyren Carlton ZOX:[id number] DOB: 07/27/1980 DOA: 07/29/2014 PCP: No PCP Per Patient  Brief narrative: Frank Warner is a 34 y.o. male who presents to the ER for right 1st molar pain, swelling of right face and jaw. Initial CT scan did not reveal an abscess but I have just received a call from the radiologist, Dr. Lindi Adiean Boyle, this morning reviewed the CT and feels that he has a right mandibular subperiosteal abscess measuring 0.5 x 2.3 cm.  Subjective: Similar pain and swelling in right jaw. No change in symptoms. Awaiting OR this AM.    Assessment/Plan: Principal Problem:   Odontogenic infection of jaw/abscess and cellulitis right face - [facility] CT and orthopantogram obtained- has right mandibular abscess with dental caries - evaluated by Dr Gunnar FusiKulinsky yeterday who will take him to the OR today for dental extractions - Continue Unasyn- transition to Augmentin on d/c- will still nee to f/u with a dentist at a later date - cont Dilaudid and hydrocodone to help control pain  Code Status: Full code Family Communication:  Disposition Plan: attempt made by case management to obtain outpt dental appt however, she was not able to get an appointment- case management will work on a match letter to help him obtain his antibiotics DVT prophylaxis: Heparin Consultants: dental surgery Procedures:  Antibiotics: Anti-infectives    Start     Dose/Rate Route Frequency Ordered Stop   08/04/14 0000  amoxicillin-clavulanate (AUGMENTIN) 875-125 MG per tablet     1 tablet Oral 2 times daily 08/04/14 1221     08/03/14 0900  [MAR Hold]  Ampicillin-Sulbactam (UNASYN) 3 g in sodium chloride 0.9 % 100 mL IVPB     (MAR Hold since 08/05/14 0746)   3 g 100 mL/hr over 60 Minutes Intravenous 4 times per day 08/03/14 0157     08/02/14 1000  amoxicillin-clavulanate (AUGMENTIN) 875-125 MG per tablet 1 tablet  Status:  Discontinued      1 tablet Oral 2 times daily 08/02/14 0715 08/02/14 0945   08/02/14 1000  Ampicillin-Sulbactam (UNASYN) 3 g in sodium chloride 0.9 % 100 mL IVPB  Status:  Discontinued     3 g 100 mL/hr over 60 Minutes Intravenous Every 6 hours 08/02/14 0945 08/03/14 0157   07/29/14 2359  Ampicillin-Sulbactam (UNASYN) 3 g in sodium chloride 0.9 % 100 mL IVPB  Status:  Discontinued     3 g 100 mL/hr over 60 Minutes Intravenous Every 6 hours 07/29/14 2329 08/02/14 0715   07/29/14 2015  clindamycin (CLEOCIN) IVPB 600 mg     600 mg 100 mL/hr over 30 Minutes Intravenous  Once 07/29/14 2003 07/29/14 2248      Objective: Filed Weights   07/30/14 0148  Weight: 97.2 kg (214 lb 4.6 oz)    Intake/Output Summary (Last 24 hours) at 08/05/14 0829 Last data filed at 08/05/14 0700  Gross per 24 hour  Intake    540 ml  Output      0 ml  Net    540 ml     Vitals Filed Vitals:   08/04/14 0510 08/04/14 1303 08/04/14 2210 08/05/14 0626  BP: 134/93 132/87 136/80 153/81  Pulse: 64 61 68 53  Temp: 98.6 F (37 C) 99.1 F (37.3 C) 98.6 F (37 C) 98.2 F (36.8 C)  TempSrc: Oral Oral Oral Oral  Resp: 17 17 16 16   Height:      Weight:  SpO2: 100% 100% 100% 100%    Exam:  General:  Pt is alert, not in acute distress  HEENT: No icterus, No thrush, oral mucosa moist- swelling of right mandibular and maxillary area with tenderness, swelling and gums in right lower jaw  Cardiovascular: regular rate and rhythm, S1/S2 No murmur  Respiratory: clear to auscultation bilaterally   Abdomen: Soft, +Bowel sounds, non tender, non distended, no guarding  MSK: No LE edema, cyanosis or clubbing  Data Reviewed: Basic Metabolic Panel:  Recent Labs Lab 07/29/14 2155  NA 134*  K 4.2  CL 99*  CO2 24  GLUCOSE 108*  BUN <5*  CREATININE 1.23  CALCIUM 9.1   Liver Function Tests: No results for input(s): AST, ALT, ALKPHOS, BILITOT, PROT, ALBUMIN in the last 168 hours. No results for input(s): LIPASE, AMYLASE  in the last 168 hours. No results for input(s): AMMONIA in the last 168 hours. CBC:  Recent Labs Lab 07/29/14 2044  08/01/14 0545 08/02/14 0410 08/03/14 0500 08/04/14 0347 08/05/14 0515  WBC 29.5*  < > 14.0* 10.9* 12.4* 12.0* 10.7*  NEUTROABS 25.0*  --   --   --   --   --   --   HGB 16.5  < > 14.5 15.3 14.4 14.6 14.9  HCT 46.6  < > 42.9 44.5 42.4 43.1 43.7  MCV 91.9  < > 93.3 92.1 93.2 93.1 93.0  PLT 226  < > 250 214 250 272 303  < > = values in this interval not displayed. Cardiac Enzymes: No results for input(s): CKTOTAL, CKMB, CKMBINDEX, TROPONINI in the last 168 hours. BNP (last 3 results) No results for input(s): BNP in the last 8760 hours.  ProBNP (last 3 results) No results for input(s): PROBNP in the last 8760 hours.  CBG: No results for input(s): GLUCAP in the last 168 hours.  Recent Results (from the past 240 hour(s))  Surgical pcr screen     Status: None   Collection Time: 08/05/14 12:41 AM  Result Value Ref Range Status   MRSA, PCR NEGATIVE NEGATIVE Final   Staphylococcus aureus NEGATIVE NEGATIVE Final    Comment:        The Xpert SA Assay (FDA approved for NASAL specimens in patients over 60 years of age), is one component of a comprehensive surveillance program.  Test performance has been validated by [facility] for patients greater than or equal to 55 year old. It is not intended to diagnose infection nor to guide or monitor treatment.      Studies: No results found.  Scheduled Meds:  Scheduled Meds: . [MAR Hold] ampicillin-sulbactam (UNASYN) IV  3 g Intravenous 4 times per day  . [MAR Hold] oxyCODONE-acetaminophen  1-2 tablet Oral 6 times per day   Continuous Infusions: . lactated ringers 50 mL/hr at 08/05/14 0813    Time spent on care of this patient: 35 min   Jake Fuhrmann, MD 08/05/2014, 8:29 AM  LOS: 6 days   [facility] Office  [phone] Pager - Text Page per [email or web address] If 7PM-7AM, please contact night-coverage  [email or web address]

## 2014-08-05 NOTE — Anesthesia Preprocedure Evaluation (Signed)
Anesthesia Evaluation  Patient identified by MRN, date of birth, ID band Patient awake    Reviewed: Allergy & Precautions, NPO status , Patient's Chart, lab work & pertinent test results  Airway Mallampati: II  TM Distance: >3 FB Neck ROM: Full    Dental no notable dental hx.    Pulmonary neg pulmonary ROS, Current Smoker,  breath sounds clear to auscultation  Pulmonary exam normal       Cardiovascular negative cardio ROS Normal cardiovascular examRhythm:Regular Rate:Normal     Neuro/Psych  Headaches, negative neurological ROS  negative psych ROS   GI/Hepatic negative GI ROS, Neg liver ROS,   Endo/Other  negative endocrine ROS  Renal/GU negative Renal ROS     Musculoskeletal negative musculoskeletal ROS (+)   Abdominal   Peds  Hematology negative hematology ROS (+)   Anesthesia Other Findings   Reproductive/Obstetrics negative OB ROS                             Anesthesia Physical Anesthesia Plan  ASA: II  Anesthesia Plan: General   Post-op Pain Management:    Induction: Intravenous  Airway Management Planned: Nasal ETT  Additional Equipment: None  Intra-op Plan:   Post-operative Plan: Extubation in OR  Informed Consent: I have reviewed the patients History and Physical, chart, labs and discussed the procedure including the risks, benefits and alternatives for the proposed anesthesia with the patient or authorized representative who has indicated his/her understanding and acceptance.   Dental advisory given  Plan Discussed with: CRNA  Anesthesia Plan Comments:         Anesthesia Quick Evaluation

## 2014-08-05 NOTE — Anesthesia Postprocedure Evaluation (Signed)
Anesthesia Post Note  Patient: Frank Warner  Procedure(s) Performed: Procedure(s) (LRB): Extraction of tooth #'s 1,16,19, and 30 with alveoloplasty. (N/A)  Anesthesia type: General  Patient location: PACU  Post pain: Pain level controlled  Post assessment: Post-op Vital signs reviewed  Last Vitals: BP 147/72 mmHg  Pulse 73  Temp(Src) 37.1 C (Oral)  Resp 18  Ht 6' (1.829 m)  Wt 214 lb 4.6 oz (97.2 kg)  BMI 29.06 kg/m2  SpO2 100%  Post vital signs: Reviewed  Level of consciousness: sedated  Complications: No apparent anesthesia complications

## 2014-08-05 NOTE — Discharge Instructions (Signed)

## 2014-08-05 NOTE — Transfer of Care (Signed)
Immediate Anesthesia Transfer of Care Note  Patient: Frank Warner  Procedure(s) Performed: Procedure(s): Extraction of tooth #'s 1,16,19, and 30 with alveoloplasty. (N/A)  Patient Location: PACU  Anesthesia Type:General  Level of Consciousness: awake, alert  and oriented  Airway & Oxygen Therapy: Patient Spontanous Breathing and Patient connected to nasal cannula oxygen  Post-op Assessment: Report given to RN and Post -op Vital signs reviewed and stable  Post vital signs: Reviewed and stable  Last Vitals:  Filed Vitals:   08/05/14 1027  BP:   Pulse:   Temp: 37.2 C  Resp:     Complications: No apparent anesthesia complications

## 2014-08-05 NOTE — Anesthesia Procedure Notes (Signed)
Procedure Name: Intubation Date/Time: 08/05/2014 9:12 AM Performed by: Dairl PonderJIANG, Shakedra Beam Pre-anesthesia Checklist: Patient identified, Timeout performed, Emergency Drugs available, Suction available and Patient being monitored Patient Re-evaluated:Patient Re-evaluated prior to inductionOxygen Delivery Method: Circle system utilized Preoxygenation: Pre-oxygenation with 100% oxygen Intubation Type: IV induction Ventilation: Mask ventilation without difficulty Laryngoscope Size: Mac, 4 and Glidescope Grade View: Grade I Nasal Tubes: Nasal Rae Tube size: 7.0 mm Number of attempts: 1 Placement Confirmation: ETT inserted through vocal cords under direct vision,  breath sounds checked- equal and bilateral and positive ETCO2 Tube secured with: Tape Dental Injury: Teeth and Oropharynx as per pre-operative assessment

## 2014-08-05 NOTE — Care Management Note (Signed)
Case Management Note  Patient Details  Name: Frank Warner MRN: [id number] Date of Birth: 07/16/1980  Subjective/Objective:                    Action/Plan: Patient has MATCH letter and [facility] information   Expected Discharge Date 08-07-14               Expected Discharge Plan:  Home/Self Care  In-House Referral:     Discharge planning Services  CM Consult, Indigent Health Clinic, Medication Assistance, [facility]  Post Acute Care Choice:    Choice offered to:     DME Arranged:    DME Agency:     HH Arranged:    HH Agency:     Status of Service:  In process, will continue to follow  Medicare Important Message Given:    Date Medicare IM Given:    Medicare IM give by:    Date Additional Medicare IM Given:    Additional Medicare Important Message give by:     If discussed at Long Length of Stay Meetings, dates discussed:  08-05-14  Additional Comments:  Kingsley PlanWile, Fionnuala Hemmerich Marie, RN 08/05/2014, 10:05 AM

## 2014-08-05 NOTE — Op Note (Signed)
OPERATIVE REPORT  Patient:            Frank Warner Date of Birth:  08/22/1980 MRN:                [id number]   DATE OF PROCEDURE:  08/05/2014  PREOPERATIVE DIAGNOSES: 1.  Right facial swelling 2. Acute pulpitis 3. Periapical abscess 4. Chronic apical periodontitis 5. Dental caries  POSTOPERATIVE DIAGNOSES: 1.  Right facial swelling 2. Acute pulpitis 3. Periapical abscess 4. Chronic apical periodontitis 5. Dental caries  OPERATIONS: 1. Multiple extraction of tooth numbers 1, 16, 19, and 30 2. 4 Quadrants of alveoloplasty  SURGEON: Charlynne Panderonald F. Kulinski, DDS  ASSISTANT: Rory PercyLisa Ingram, (dental assistant)  ANESTHESIA: General anesthesia via nasoendotracheal tube.  MEDICATIONS: 1. Ancef 2 g IV prior to invasive dental procedures. 2. Local anesthesia with a total utilization of 4 carpules each containing 34 mg of lidocaine with 0.017 mg of epinephrine as well as 2 carpules each containing 9 mg of bupivacaine with 0.009 mg of epinephrine.  SPECIMENS: There are 4 teeth that were discarded.  DRAINS: None  CULTURES: None  COMPLICATIONS: None   ESTIMATED BLOOD LOSS: 50 mLs.  INTRAVENOUS FLUIDS: 750 mLs of Lactated ringers solution.  INDICATIONS: The patient was recently diagnosed with right facial swelling.  A dental consultation was then requested to evaluate poor dentition and provide treatment as indicated.  The patient was examined and treatment planned for multiple extraction of indicated teeth with alveoloplasty.  This treatment plan was formulated to decrease the risks and complications associated with dental infection from further affecting the patient's systemic health.  OPERATIVE FINDINGS: Patient was examined in operating room number 15.  The teeth were identified for extraction. It was determined that tooth numbers 1, 16, 19, and 30 would be extracted at this time. Tooth #17 was not extracted at this time due to the potential complexity requiring an oral surgeon for  that dental extraction. The patient was noted be affected by right facial swelling, history of acute pulpitis, dental caries, and chronic apical periodontitis.  DESCRIPTION OF PROCEDURE: Patient was brought to the main operating room number 15. Patient was then placed in the supine position on the operating table. General anesthesia was then induced per the anesthesia team. The patient was then prepped and draped in the usual manner for dental medicine procedure. A timeout was performed. The patient was identified and procedures were verified. A throat pack was placed at this time. The oral cavity was then thoroughly examined with the findings noted above. The patient was then ready for dental medicine procedure as follows:  Local anesthesia was then administered sequentially with a total utilization of 4 carpules each containing 34 mg of lidocaine with 0.017 mg of epinephrine as well as 2 carpules  each containing 9 mg bupivacaine with 0.009 mg of epinephrine.  The Maxillary left and right quadrants first approached. Anesthesia was then delivered utilizing infiltration with lidocaine with epinephrine. A #15 blade incision was then made from the maxillary right tuberosity and extended to the mesial of #2.  A  surgical flap was then carefully reflected. Appropriate amounts of buccal and interseptal bone were then removed utilizing a surgical handpiece and bur and copious amounts of sterile water.  Tooth #1 was then subluxated and removed with a 150 forceps without complications. Alveoloplasty was then performed utilizing a rongeur and bone file. The surgical site was then irrigated with copious file sterile saline. The tissues were approximated and trimmed appropriately. The surgical site was  then closed from the maxillary right tuberosity and extended to the distal of #2 utilizing 3-0 chromic gut suture in a continuous interrupted suture technique 1.   At this point time the maxillary left quadrant was  approached. 15 blade incision was made from the maxillary left tuberosity and extended to the mesial #15. A surgical flap was then carefully reflected. Appropriate amounts of buccal bone removed with a surgical handpiece and bur and copious amounts sterile water. Tooth #16 was then subluxated and removed with a 150 forceps without complications. Alveoloplasty is a performed utilizing a rongeur and bone file. The surgical site was then irrigated with copious amounts of sterile saline. The tissues were approximated and trimmed appropriately. The surgical site was then closed from the maxillary left tuberosity and extended the distal of #15 utilizing 3-0 chromic gut suture in a continuous interrupted suture technique 1.  At this point time, the mandibular quadrants were approached. The patient was given bilateral inferior alveolar nerve blocks and long buccal nerve blocks utilizing the bupivacaine with epinephrine. Further infiltration was then achieved utilizing the lidocaine with epinephrine. A 15 blade incision was then made around tooth #30. A sulcular flap was then reflected appropriately. Tooth #30 was then subluxated with a series straight elevators. Tooth #30 was then removed with a 23 forceps without complications. At this point in time significant granulation tissue was removed in the area of #30 along with the alveoloplasty procedure with a rongeur and bone file. Copious heme was removed from this area with no evidence of purulent exudate. The surgical site was then irrigated with copious amounts of sterile saline  4. The surgical site was then closed from the mesial #31 and extended to the distal of #29 utilizing 3-0 chromic gut suture in a continuous therapy suture technique 1.   At this point time the mandibular left quadrant was approached. A 15 blade incision was made around tooth #19. A sulcular flap was then reflected appropriately. Tooth #19 was then subluxated with a series of straight  elevators. Tooth #19 was then removed without complication with a 23 forceps. Alveoloplasty was then performed utilizing a rongeur and bone file. The surgical site was irrigated with copious file sterile saline. The surgical site was then closed from the mesial #18 and extended the distal of #20 utilizing 3-0 chromic gut suture in a continuous interrupted suture technique 1.      At this point time, the entire mouth was irrigated with copious amounts of sterile saline. The patient was examined for complications, seeing none, the dental medicine procedure was deemed to be complete. The throat pack was removed at this time. An oral airway was then placed at the request of the anesthesia team. A series of 4 x 4 gauze were placed in the mouth to aid hemostasis. The patient was then handed over to the anesthesia team for final disposition. After an appropriate amount of time, the patient was extubated and taken to the postanesthsia care unit in good condition. All counts were correct for the dental medicine procedure. The patient may be discharged at the discretion of the hospitalist team with oral Augmentin therapy for 5 days as well as appropriate pain medication. The patient is to return to dental medicine in 7-10 days for evaluation for suture removal. Patient is aware that he will need to follow up with the dentist of his choice for exam, radiographs, and discussion of other dental treatment needs at that time.   Charlynne Pander, DDS.

## 2014-08-05 NOTE — Progress Notes (Signed)
PRE-OPERATIVE NOTE:  08/05/2014   Frank Warner [id number]  VITALS: BP 153/81 mmHg  Pulse 53  Temp(Src) 98.2 F (36.8 C) (Oral)  Resp 16  Ht 6' (1.829 m)  Wt 214 lb 4.6 oz (97.2 kg)  BMI 29.06 kg/m2  SpO2 100%  Lab Results  Component Value Date   WBC 10.7* 08/05/2014   HGB 14.9 08/05/2014   HCT 43.7 08/05/2014   MCV 93.0 08/05/2014   PLT 303 08/05/2014   BMET    Component Value Date/Time   NA 134* 07/29/2014 2155   K 4.2 07/29/2014 2155   CL 99* 07/29/2014 2155   CO2 24 07/29/2014 2155   GLUCOSE 108* 07/29/2014 2155   BUN <5* 07/29/2014 2155   CREATININE 1.23 07/29/2014 2155   CALCIUM 9.1 07/29/2014 2155   GFRNONAA >60 07/29/2014 2155   GFRAA >60 07/29/2014 2155    No results found for: INR, PROTIME No results found for: PTT   Frank Warner presents for extraction of indicated teeth with alveoloplasty in the operating room with general anesthesia.  Patient also agrees to proceed with incision and drainage procedure as needed.   SUBJECTIVE: The patient denies any acute medical or dental changes and agrees to proceed with treatment as planned. The patient expresses understanding that tooth #30 will be extracted followed by tooth numbers 1, 16, and 19 as indicated.  EXAM: No sign of acute dental changes.  ASSESSMENT: Patient is affected by right facial swelling, periapical abscess without sinus, chronic apical periodontitis, dental caries, chronic periodontitis.  PLAN: Patient agrees to proceed with treatment as planned in the operating room as previously discussed and accepts the risks, benefits, and complications of the proposed treatment. Patient is aware of the risk for bleeding, bruising, swelling, infection, pain, nerve damage, soft tissue damage, damage to adjacent teeth, sinus involvement, root tip fracture, mandible fracture, and the risks of complications associated with the anesthesia. Patient also is aware of the potential for other complications not  mentioned above.   Charlynne Panderonald F. Shonique Pelphrey, DDS

## 2014-08-06 ENCOUNTER — Encounter (HOSPITAL_COMMUNITY): Payer: Self-pay | Admitting: Dentistry

## 2014-08-06 DIAGNOSIS — M272 Inflammatory conditions of jaws: Secondary | ICD-10-CM

## 2014-08-06 MED ORDER — AMOXICILLIN-POT CLAVULANATE 875-125 MG PO TABS: 1.0000 | ORAL_TABLET | Freq: Two times a day (BID) | ORAL | Status: DC

## 2014-08-06 MED ORDER — OXYCODONE-ACETAMINOPHEN 5-325 MG PO TABS: 1.0000 | ORAL_TABLET | Freq: Four times a day (QID) | ORAL | Status: DC

## 2014-08-06 NOTE — Progress Notes (Signed)
Discussed discharge summary with patient. Reviewed all medications with patient. Patient received Rx. Patient ready for discharge. 

## 2014-08-06 NOTE — Discharge Summary (Signed)
Physician Discharge Summary  Frank Warner FAO:[id number] DOB: 10/25/1980 DOA: 07/29/2014  PCP: No PCP Per Patient  Admit date: 07/29/2014 Discharge date: 08/06/2014  Time spent: 35 minutes  Recommendations for Outpatient Follow-up:  1. Follow up with Dr Kristin BruinsKulinski for suture removal.    Discharge Diagnoses:    Odontogenic infection of jaw   Dental caries   Cellulitis and abscess of face   Discharge Condition: Stable.   Diet recommendation: bland diet  Filed Weights   07/30/14 0148  Weight: 97.2 kg (214 lb 4.6 oz)    History of present illness:  Frank Warner is a 34 y.o. male who presents to the ED with RL 1st molar pain, mouth pain, swelling of the R side of his face and jaw. Pain is throbbing, severe, onset gradually over the past 6 months. Swelling onset last night. Fractured tooth a while ago, has not seen a dentist. Talking exacerbates the pain, he has been unable to eat solids for the last couple of days.  Hospital Course:  Odontogenic infection of jaw/abscess and cellulitis right face - CT and orthopantogram obtained- has right mandibular abscess with dental caries - evaluated by Dr Robin SearingKulinsky  who took patient  to the OR today for dental extractions - Continue Unasyn- transition to Augmentin on d/c- will still nee to f/u with a dentist at a later date - cont Dilaudid and hydrocodone to help control pain Discharge on Augmentin for 7 days.   Procedures: 1. Multiple extraction of tooth numbers 1, 16, 19, and 30 2. 4 Quadrants of alveoloplasty  Consultations:  Dr Kristin BruinsKulinski  Discharge Exam: Filed Vitals:   08/06/14 0518  BP: 128/70  Pulse: 55  Temp: 98.1 F (36.7 C)  Resp:     General: Alert in no distress.  Head; right side face with swelling.  Cardiovascular: S 1, S 2 RRR Respiratory: CTA  Discharge Instructions   Discharge Instructions    Diet - low sodium heart healthy    Complete by:  As directed      Diet - low sodium heart healthy    Complete by:   As directed      Increase activity slowly    Complete by:  As directed      Increase activity slowly    Complete by:  As directed           Current Discharge Medication List    START taking these medications   Details  amoxicillin-clavulanate (AUGMENTIN) 875-125 MG per tablet Take 1 tablet by mouth 2 (two) times daily. Qty: 14 tablet, Refills: 0    oxyCODONE-acetaminophen (PERCOCET/ROXICET) 5-325 MG per tablet Take 1 tablet by mouth every 6 (six) hours. Qty: 30 tablet, Refills: 0      CONTINUE these medications which have NOT CHANGED   Details  acetaminophen (TYLENOL) 500 MG tablet Take 1,000 mg by mouth every 6 (six) hours as needed for headache.    ibuprofen (ADVIL,MOTRIN) 200 MG tablet Take 800 mg by mouth every 6 (six) hours as needed for headache.      STOP taking these medications     aspirin-acetaminophen-caffeine (EXCEDRIN MIGRAINE) 250-250-65 MG per tablet        No Known Allergies Follow-up Information    Follow up with [facility]    .   Why:  August 12, 2014 at 330 pm    Contact information:   [address] [place] [id number] [phone]      Follow up  with Charlynne Pander, DDS On 08/13/2014.   Specialty:  Dentistry   Why:  For suture removal, For wound re-check   Contact information:   [address] [place] [address] [phone]        The results of significant diagnostics from this hospitalization (including imaging, microbiology, ancillary and laboratory) are listed below for reference.    Significant Diagnostic Studies: Dg Orthopantogram  07/30/2014   CLINICAL DATA:  Swelling to right jawline/toothache since yesterday.  EXAM: ORTHOPANTOGRAM/PANORAMIC  COMPARISON:  Soft tissue neck CT today.  FINDINGS: Bony structures are within normal. No significant periodontal disease. Bilateral upper and lower dental caries.  IMPRESSION: No focal bony abnormality and no significant periodontal  disease. Several dental caries.   Electronically Signed   By: Elberta Fortis M.D.   On: 07/30/2014 12:24   Ct Soft Tissue Neck W Contrast  07/30/2014   ADDENDUM REPORT: 07/30/2014 12:48  ADDENDUM: Dental caries are also present over the first lower molar teeth bilaterally as well as the most posterior bilateral upper molar teeth. There is a fluid collection adjacent the right body of the mandible measuring 0.5 x 2.3 cm in transverse in AP dimensions likely a small abscess of dental origin. Moderate adjacent subcutaneous inflammation. No significant periapical lucency/ periodontal disease is evident.  Findings called to Dr. Butler Denmark at the time dictation.   Electronically Signed   By: Elberta Fortis M.D.   On: 07/30/2014 12:48   07/30/2014   CLINICAL DATA:  Dental abscess to right lower molar. 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 Evaluate extent of the abscess. Dental pain, swelling onset this week.  EXAM: CT NECK WITH CONTRAST  TECHNIQUE: Multidetector CT imaging of the neck was performed using the standard protocol following the bolus administration of intravenous contrast.  CONTRAST:  75mL OMNIPAQUE IOHEXOL 300 MG/ML  SOLN  COMPARISON:  None.  FINDINGS: Pharynx and larynx: Prominent adenoidal soft tissue. Prominent tonsillar hypertrophy, right greater than left. No fluid collection. There is no retropharyngeal effusion, fluid collection or abscess. The epiglottis is normal. Diffuse soft tissue stranding about the right face and neck without subcutaneous fluid collection.  Salivary glands: No inflammation.  Thyroid: Normal.  Lymph nodes: Prominent cervical lymph nodes, right greater than left, likely reactive.  Vascular: Patent, no thrombosis or inflammation.  Limited intracranial: Normal.  Visualized orbits: Normal.  Mastoids and visualized paranasal sinuses: Dental carie of the right upper molar without periapical lucency or abscess. No periapical lucencies about any tooth. Mild mucosal thickening of the left maxillary  sinus, paranasal sinuses are otherwise clear. Mastoids are clear.  Skeleton: Normal cervical spine alignment.  Upper chest: Included mediastinum is normal. No mediastinal adenopathy or fluid collection. No mediastinal inflammation. The lung apices are clear, no consolidation.  IMPRESSION: 1. Dental carie of the right upper molar. No definite CT abnormality of the right lower molars. There is soft tissue stranding about the right face and upper neck. No periapical or soft tissue abscess. No inflammatory extension to the mediastinum or retropharyngeal soft tissues. 2. Prominent cervical lymph nodes, right greater than left are likely reactive. Prominence of the palatine tonsils and adenoids, also likely reactive.  Electronically Signed: By: Rubye Oaks M.D. On: 07/29/2014 21:32    Microbiology: Recent Results (from the past 240 hour(s))  Surgical pcr screen     Status: None   Collection Time: 08/05/14 12:41 AM  Result Value Ref Range Status   MRSA, PCR NEGATIVE NEGATIVE Final   Staphylococcus aureus NEGATIVE NEGATIVE Final  Comment:        The Xpert SA Assay (FDA approved for NASAL specimens in patients over 54 years of age), is one component of a comprehensive surveillance program.  Test performance has been validated by [facility] for patients greater than or equal to 52 year old. It is not intended to diagnose infection nor to guide or monitor treatment.      Labs: Basic Metabolic Panel: No results for input(s): NA, K, CL, CO2, GLUCOSE, BUN, CREATININE, CALCIUM, MG, PHOS in the last 168 hours. Liver Function Tests: No results for input(s): AST, ALT, ALKPHOS, BILITOT, PROT, ALBUMIN in the last 168 hours. No results for input(s): LIPASE, AMYLASE in the last 168 hours. No results for input(s): AMMONIA in the last 168 hours. CBC:  Recent Labs Lab 08/01/14 0545 08/02/14 0410 08/03/14 0500 08/04/14 0347 08/05/14 0515  WBC 14.0* 10.9* 12.4* 12.0* 10.7*  HGB 14.5 15.3 14.4  14.6 14.9  HCT 42.9 44.5 42.4 43.1 43.7  MCV 93.3 92.1 93.2 93.1 93.0  PLT 250 214 250 272 303   Cardiac Enzymes: No results for input(s): CKTOTAL, CKMB, CKMBINDEX, TROPONINI in the last 168 hours. BNP: BNP (last 3 results) No results for input(s): BNP in the last 8760 hours.  ProBNP (last 3 results) No results for input(s): PROBNP in the last 8760 hours.  CBG: No results for input(s): GLUCAP in the last 168 hours.     SignedHartley Barefoot A  [facility] 08/06/2014, 9:53 AM

## 2014-08-12 ENCOUNTER — Inpatient Hospital Stay: Payer: Self-pay | Admitting: Internal Medicine

## 2014-08-18 ENCOUNTER — Ambulatory Visit (HOSPITAL_COMMUNITY): Payer: Self-pay | Admitting: Dentistry

## 2014-08-19 ENCOUNTER — Ambulatory Visit (HOSPITAL_COMMUNITY): Payer: Self-pay | Admitting: Dentistry

## 2014-08-19 ENCOUNTER — Encounter (HOSPITAL_COMMUNITY): Payer: Self-pay | Admitting: Dentistry

## 2014-08-19 VITALS — BP 127/68 | HR 68 | Temp 98.5°F

## 2014-08-19 DIAGNOSIS — K0530 Chronic periodontitis, unspecified: Secondary | ICD-10-CM

## 2014-08-19 DIAGNOSIS — K029 Dental caries, unspecified: Secondary | ICD-10-CM

## 2014-08-19 DIAGNOSIS — R220 Localized swelling, mass and lump, head: Secondary | ICD-10-CM

## 2014-08-19 DIAGNOSIS — K036 Deposits [accretions] on teeth: Secondary | ICD-10-CM

## 2014-08-19 DIAGNOSIS — K08109 Complete loss of teeth, unspecified cause, unspecified class: Secondary | ICD-10-CM

## 2014-08-19 DIAGNOSIS — M270 Developmental disorders of jaws: Secondary | ICD-10-CM

## 2014-08-19 DIAGNOSIS — K08409 Partial loss of teeth, unspecified cause, unspecified class: Secondary | ICD-10-CM

## 2014-08-19 NOTE — Patient Instructions (Addendum)
PLAN: 1. Continue salt water rinses as needed to aid healing. 2. Brush after meals and at bedtime 3. Floss at bedtime 4. Follow-up with new primary dentist for exam, radiographs, and discussion of dental treatment needs including periodontal therapy, dental restorations, additional dental extractions and other treatment as indicated.  Charlynne Pander, DDS

## 2014-08-19 NOTE — Progress Notes (Signed)
POST OPERATIVE NOTE:  08/19/2014 Frank Warner [id number]  VITALS: BP 127/68 mmHg  Pulse 68  Temp(Src) 98.5 F (36.9 C) (Oral)  LABS:  Lab Results  Component Value Date   WBC 10.7* 08/05/2014   HGB 14.9 08/05/2014   HCT 43.7 08/05/2014   MCV 93.0 08/05/2014   PLT 303 08/05/2014   BMET    Component Value Date/Time   NA 134* 07/29/2014 2155   K 4.2 07/29/2014 2155   CL 99* 07/29/2014 2155   CO2 24 07/29/2014 2155   GLUCOSE 108* 07/29/2014 2155   BUN <5* 07/29/2014 2155   CREATININE 1.23 07/29/2014 2155   CALCIUM 9.1 07/29/2014 2155   GFRNONAA >60 07/29/2014 2155   GFRAA >60 07/29/2014 2155    No results found for: INR, PROTIME No results found for: PTT   Frank Warner is status post multiple dental extractions with alveoloplasty in the operative member general anesthesia on 08/05/2014. The patient now presents for evaluation of healing and suture removal as needed.  SUBJECTIVE: Patient denies any significant discomfort from that dental extraction sites. The patient is eating without problems.  EXAM: There is no sign of infection, heme, or ooze. There is no evidence of extra oral or intraoral swelling. Extraction sites are healing in by secondary intention. No sutures are noted. Patient has residual dental caries and chronic periodontitis with accretions and need for follow-up with a new primary dentist.  ASSESSMENT: Post operative course is consistent with dental procedures performed in the operating room. Right facial swelling has resolved. Dental caries Chronic periodontitis Accretions Bilateral mandibular lingual tori  PLAN: 1. Continue salt water rinses as needed to aid healing. 2. Brush after meals and at bedtime 3. Floss at bedtime 4. Follow-up with new primary dentist for exam, radiographs, and discussion of dental treatment needs including periodontal therapy, dental restorations, additional dental extractions and other treatment as  indicated.   Charlynne Pander, DDS

## 2016-08-23 ENCOUNTER — Encounter (HOSPITAL_COMMUNITY): Payer: Self-pay | Admitting: Emergency Medicine

## 2016-08-23 ENCOUNTER — Emergency Department (HOSPITAL_COMMUNITY): Payer: Self-pay | Admitting: Anesthesiology

## 2016-08-23 ENCOUNTER — Observation Stay (HOSPITAL_COMMUNITY)
Admission: EM | Admit: 2016-08-23 | Discharge: 2016-08-24 | Disposition: A | Discharge status: Home / Self Care | Payer: Self-pay | Attending: Vascular Surgery | Admitting: Vascular Surgery

## 2016-08-23 ENCOUNTER — Encounter (HOSPITAL_COMMUNITY)
Admission: EM | Disposition: A | Discharge status: Home / Self Care | Payer: Self-pay | Source: Home / Self Care | Attending: Emergency Medicine

## 2016-08-23 DIAGNOSIS — W458XXA Other foreign body or object entering through skin, initial encounter: Secondary | ICD-10-CM | POA: Insufficient documentation

## 2016-08-23 DIAGNOSIS — F17210 Nicotine dependence, cigarettes, uncomplicated: Secondary | ICD-10-CM | POA: Insufficient documentation

## 2016-08-23 DIAGNOSIS — S61511A Laceration without foreign body of right wrist, initial encounter: Principal | ICD-10-CM | POA: Insufficient documentation

## 2016-08-23 DIAGNOSIS — S51839A Puncture wound without foreign body of unspecified forearm, initial encounter: Secondary | ICD-10-CM

## 2016-08-23 DIAGNOSIS — S55001A Unspecified injury of ulnar artery at forearm level, right arm, initial encounter: Secondary | ICD-10-CM | POA: Diagnosis present

## 2016-08-23 DIAGNOSIS — S55011A Laceration of ulnar artery at forearm level, right arm, initial encounter: Secondary | ICD-10-CM

## 2016-08-23 HISTORY — PX: FASCIOTOMY: SHX132

## 2016-08-23 HISTORY — PX: ARTERY REPAIR: SHX559

## 2016-08-23 HISTORY — DX: Puncture wound without foreign body of unspecified forearm, initial encounter: S51.839A

## 2016-08-23 LAB — CBC
HCT: 44.1 % (ref 39.0–52.0)
HCT: 41.7 % (ref 39.0–52.0)
HEMOGLOBIN: 14.8 g/dL (ref 13.0–17.0)
HEMOGLOBIN: 14.1 g/dL (ref 13.0–17.0)
MCH: 31.6 pg (ref 26.0–34.0)
MCH: 31.9 pg (ref 26.0–34.0)
MCHC: 33.6 g/dL (ref 30.0–36.0)
MCHC: 33.8 g/dL (ref 30.0–36.0)
MCV: 94 fL (ref 78.0–100.0)
MCV: 94.3 fL (ref 78.0–100.0)
PLATELETS: 184 10*3/uL (ref 150–400)
Platelets: 206 10*3/uL (ref 150–400)
RBC: 4.69 MIL/uL (ref 4.22–5.81)
RBC: 4.42 MIL/uL (ref 4.22–5.81)
RDW: 14.7 % (ref 11.5–15.5)
RDW: 14.7 % (ref 11.5–15.5)
WBC: 10.4 10*3/uL (ref 4.0–10.5)
WBC: 12.7 10*3/uL — AB (ref 4.0–10.5)

## 2016-08-23 LAB — POCT I-STAT 4, (NA,K, GLUC, HGB,HCT)
Glucose, Bld: 117 mg/dL — ABNORMAL HIGH (ref 65–99)
HEMATOCRIT: 46 % (ref 39.0–52.0)
Hemoglobin: 15.6 g/dL (ref 13.0–17.0)
Potassium: 3.7 mmol/L (ref 3.5–5.1)
SODIUM: 142 mmol/L (ref 135–145)

## 2016-08-23 LAB — CREATININE, SERUM
Creatinine, Ser: 1.1 mg/dL (ref 0.61–1.24)
GFR calc non Af Amer: >60 mL/min (ref >60)

## 2016-08-23 LAB — ABO/RH: ABO/RH(D): O POS

## 2016-08-23 LAB — TYPE AND SCREEN
ABO/RH(D): O POS
ANTIBODY SCREEN: NEGATIVE

## 2016-08-23 LAB — HIV ANTIBODY (ROUTINE TESTING W REFLEX): HIV SCREEN 4TH GENERATION: NONREACTIVE

## 2016-08-23 SURGERY — REPAIR, ARTERY, BRACHIAL
Anesthesia: General | Site: Arm Lower | Laterality: Right

## 2016-08-23 MED ORDER — ALUM & MAG HYDROXIDE-SIMETH 200-200-20 MG/5ML PO SUSP: 15.0000 mL | ORAL | Status: DC | PRN

## 2016-08-23 MED ORDER — PROPOFOL 10 MG/ML IV BOLUS
INTRAVENOUS | Status: AC
  Filled 2016-08-23: qty 20

## 2016-08-23 MED ORDER — LACTATED RINGERS IV SOLN
INTRAVENOUS | Status: DC | PRN
  Administered 2016-08-23 (×2): via INTRAVENOUS

## 2016-08-23 MED ORDER — ONDANSETRON HCL 4 MG/2ML IJ SOLN: 4.0000 mg | Freq: Once | INTRAMUSCULAR | Status: DC | PRN

## 2016-08-23 MED ORDER — ONDANSETRON HCL 4 MG/2ML IJ SOLN: 4.0000 mg | Freq: Four times a day (QID) | INTRAMUSCULAR | Status: DC | PRN

## 2016-08-23 MED ORDER — METOPROLOL TARTRATE 5 MG/5ML IV SOLN: 2.0000 mg | INTRAVENOUS | Status: DC | PRN

## 2016-08-23 MED ORDER — SODIUM CHLORIDE 0.9 % IR SOLN
Status: DC | PRN
  Administered 2016-08-23: 3000 mL

## 2016-08-23 MED ORDER — MICROFIBRILLAR COLL HEMOSTAT EX POWD
CUTANEOUS | Status: AC
  Filled 2016-08-23: qty 5

## 2016-08-23 MED ORDER — OXYCODONE-ACETAMINOPHEN 5-325 MG PO TABS
ORAL_TABLET | ORAL | Status: AC
  Filled 2016-08-23: qty 1

## 2016-08-23 MED ORDER — HYDROMORPHONE HCL 1 MG/ML IJ SOLN
0.2500 mg | INTRAMUSCULAR | Status: DC | PRN
  Administered 2016-08-23 (×4): 0.5 mg via INTRAVENOUS

## 2016-08-23 MED ORDER — PHENOL 1.4 % MT LIQD: 1.0000 | OROMUCOSAL | Status: DC | PRN

## 2016-08-23 MED ORDER — ENOXAPARIN SODIUM 40 MG/0.4ML ~~LOC~~ SOLN: 40.0000 mg | SUBCUTANEOUS | Status: DC

## 2016-08-23 MED ORDER — SENNA 8.6 MG PO TABS
1.0000 | ORAL_TABLET | Freq: Two times a day (BID) | ORAL | Status: DC
  Administered 2016-08-23 (×2): 8.6 mg via ORAL
  Filled 2016-08-23 (×2): qty 1

## 2016-08-23 MED ORDER — ACETAMINOPHEN 325 MG PO TABS: 325.0000 mg | ORAL_TABLET | ORAL | Status: DC | PRN

## 2016-08-23 MED ORDER — GUAIFENESIN-DM 100-10 MG/5ML PO SYRP: 15.0000 mL | ORAL_SOLUTION | ORAL | Status: DC | PRN

## 2016-08-23 MED ORDER — LABETALOL HCL 5 MG/ML IV SOLN: 10.0000 mg | INTRAVENOUS | Status: DC | PRN

## 2016-08-23 MED ORDER — PROPOFOL 10 MG/ML IV BOLUS
INTRAVENOUS | Status: DC | PRN
  Administered 2016-08-23: 200 mg via INTRAVENOUS
  Administered 2016-08-23: 40 mg via INTRAVENOUS
  Administered 2016-08-23: 60 mg via INTRAVENOUS

## 2016-08-23 MED ORDER — MIDAZOLAM HCL 5 MG/5ML IJ SOLN
INTRAMUSCULAR | Status: DC | PRN
  Administered 2016-08-23: 2 mg via INTRAVENOUS

## 2016-08-23 MED ORDER — SODIUM CHLORIDE 0.9 % IV SOLN
INTRAVENOUS | Status: DC | PRN
  Administered 2016-08-23: 03:00:00 500 mL

## 2016-08-23 MED ORDER — BISACODYL 10 MG RE SUPP: 10.0000 mg | Freq: Every day | RECTAL | Status: DC | PRN

## 2016-08-23 MED ORDER — MIDAZOLAM HCL 2 MG/2ML IJ SOLN
INTRAMUSCULAR | Status: AC
  Filled 2016-08-23: qty 2

## 2016-08-23 MED ORDER — ENOXAPARIN SODIUM 40 MG/0.4ML ~~LOC~~ SOLN
40.0000 mg | SUBCUTANEOUS | Status: DC
  Administered 2016-08-23: 40 mg via SUBCUTANEOUS
  Filled 2016-08-23: qty 0.4

## 2016-08-23 MED ORDER — LIDOCAINE HCL (CARDIAC) 20 MG/ML IV SOLN
INTRAVENOUS | Status: DC | PRN
  Administered 2016-08-23: 100 mg via INTRATRACHEAL

## 2016-08-23 MED ORDER — PROPOFOL 10 MG/ML IV BOLUS
INTRAVENOUS | Status: AC
Start: 2016-08-23 — End: 2016-08-23
  Filled 2016-08-23: qty 20

## 2016-08-23 MED ORDER — 0.9 % SODIUM CHLORIDE (POUR BTL) OPTIME
TOPICAL | Status: DC | PRN
  Administered 2016-08-23: 1000 mL

## 2016-08-23 MED ORDER — SUCCINYLCHOLINE CHLORIDE 20 MG/ML IJ SOLN
INTRAMUSCULAR | Status: DC | PRN
  Administered 2016-08-23: 120 mg via INTRAVENOUS

## 2016-08-23 MED ORDER — SODIUM CHLORIDE 0.9 % IV SOLN
INTRAVENOUS | Status: DC
  Administered 2016-08-23 – 2016-08-24 (×2): via INTRAVENOUS

## 2016-08-23 MED ORDER — HYDROMORPHONE HCL 1 MG/ML IJ SOLN
0.2500 mg | INTRAMUSCULAR | Status: DC | PRN
  Administered 2016-08-23: 0.5 mg via INTRAVENOUS

## 2016-08-23 MED ORDER — DEXTROSE 5 % IV SOLN
1.5000 g | Freq: Once | INTRAVENOUS | Status: AC
  Administered 2016-08-23: 1.5 g via INTRAVENOUS
  Filled 2016-08-23 (×2): qty 1.5

## 2016-08-23 MED ORDER — SENNOSIDES-DOCUSATE SODIUM 8.6-50 MG PO TABS: 1.0000 | ORAL_TABLET | Freq: Every evening | ORAL | Status: DC | PRN

## 2016-08-23 MED ORDER — HYDRALAZINE HCL 20 MG/ML IJ SOLN: 5.0000 mg | INTRAMUSCULAR | Status: DC | PRN

## 2016-08-23 MED ORDER — FLEET ENEMA 7-19 GM/118ML RE ENEM: 1.0000 | ENEMA | Freq: Once | RECTAL | Status: DC | PRN

## 2016-08-23 MED ORDER — DOUBLE ANTIBIOTIC 500-10000 UNIT/GM EX OINT
TOPICAL_OINTMENT | CUTANEOUS | Status: AC
  Filled 2016-08-23: qty 2

## 2016-08-23 MED ORDER — PANTOPRAZOLE SODIUM 40 MG PO TBEC
40.0000 mg | DELAYED_RELEASE_TABLET | Freq: Every day | ORAL | Status: DC
  Administered 2016-08-23: 40 mg via ORAL
  Filled 2016-08-23: qty 1

## 2016-08-23 MED ORDER — HYDROMORPHONE HCL 1 MG/ML IJ SOLN
INTRAMUSCULAR | Status: AC
  Administered 2016-08-23: 0.5 mg via INTRAVENOUS
  Filled 2016-08-23: qty 1

## 2016-08-23 MED ORDER — OXYCODONE-ACETAMINOPHEN 5-325 MG PO TABS
ORAL_TABLET | ORAL | Status: AC
  Administered 2016-08-23: 09:00:00
  Filled 2016-08-23: qty 1

## 2016-08-23 MED ORDER — HYDROMORPHONE HCL 1 MG/ML IJ SOLN
0.5000 mg | INTRAMUSCULAR | Status: DC | PRN
Start: 2016-08-23 — End: 2016-08-24
  Administered 2016-08-23 – 2016-08-24 (×7): 1 mg via INTRAVENOUS
  Filled 2016-08-23 (×7): qty 1

## 2016-08-23 MED ORDER — CEFAZOLIN SODIUM-DEXTROSE 2-3 GM-% IV SOLR
INTRAVENOUS | Status: DC | PRN
  Administered 2016-08-23: 2 g via INTRAVENOUS

## 2016-08-23 MED ORDER — ACETAMINOPHEN 325 MG RE SUPP: 325.0000 mg | RECTAL | Status: DC | PRN

## 2016-08-23 MED ORDER — PHENYLEPHRINE HCL 10 MG/ML IJ SOLN
INTRAMUSCULAR | Status: DC | PRN
  Administered 2016-08-23: 80 ug via INTRAVENOUS
  Administered 2016-08-23: 40 ug via INTRAVENOUS

## 2016-08-23 MED ORDER — POTASSIUM CHLORIDE CRYS ER 20 MEQ PO TBCR
20.0000 meq | EXTENDED_RELEASE_TABLET | Freq: Once | ORAL | Status: AC
  Administered 2016-08-23: 20 meq via ORAL
  Filled 2016-08-23: qty 1

## 2016-08-23 MED ORDER — FENTANYL CITRATE (PF) 250 MCG/5ML IJ SOLN
INTRAMUSCULAR | Status: DC | PRN
  Administered 2016-08-23: 50 ug via INTRAVENOUS
  Administered 2016-08-23 (×2): 100 ug via INTRAVENOUS

## 2016-08-23 MED ORDER — OXYCODONE-ACETAMINOPHEN 5-325 MG PO TABS
1.0000 | ORAL_TABLET | ORAL | Status: DC | PRN
  Administered 2016-08-23 (×3): 1 via ORAL
  Administered 2016-08-23 – 2016-08-24 (×4): 2 via ORAL
  Filled 2016-08-23 (×5): qty 2

## 2016-08-23 MED ORDER — FENTANYL CITRATE (PF) 250 MCG/5ML IJ SOLN
INTRAMUSCULAR | Status: AC
  Filled 2016-08-23: qty 5

## 2016-08-23 MED ORDER — MEPERIDINE HCL 25 MG/ML IJ SOLN: 6.2500 mg | INTRAMUSCULAR | Status: DC | PRN

## 2016-08-23 MED ORDER — TETANUS-DIPHTH-ACELL PERTUSSIS 5-2.5-18.5 LF-MCG/0.5 IM SUSP
0.5000 mL | Freq: Once | INTRAMUSCULAR | Status: AC
  Administered 2016-08-23: 0.5 mL via INTRAMUSCULAR
  Filled 2016-08-23: qty 0.5

## 2016-08-23 SURGICAL SUPPLY — 49 items
ADH SKN CLS APL DERMABOND .7 (GAUZE/BANDAGES/DRESSINGS)
AGENT HMST SPONGE THK3/8 (HEMOSTASIS)
ARMBAND PINK RESTRICT EXTREMIT (MISCELLANEOUS) ×3 IMPLANT
BNDG GAUZE ELAST 4 BULKY (GAUZE/BANDAGES/DRESSINGS) ×2 IMPLANT
CANISTER SUCT 3000ML PPV (MISCELLANEOUS) ×3 IMPLANT
CANISTER WOUND CARE 500ML ATS (WOUND CARE) ×2 IMPLANT
CLIP TI MEDIUM 6 (CLIP) ×3 IMPLANT
CLIP TI WIDE RED SMALL 6 (CLIP) ×3 IMPLANT
COVER PROBE W GEL 5X96 (DRAPES) IMPLANT
CUFF TOURNIQUET SINGLE 18IN (TOURNIQUET CUFF) ×2 IMPLANT
DECANTER SPIKE VIAL GLASS SM (MISCELLANEOUS) ×3 IMPLANT
DERMABOND ADVANCED (GAUZE/BANDAGES/DRESSINGS)
DERMABOND ADVANCED .7 DNX12 (GAUZE/BANDAGES/DRESSINGS) ×1 IMPLANT
ELECT REM PT RETURN 9FT ADLT (ELECTROSURGICAL) ×3
ELECTRODE REM PT RTRN 9FT ADLT (ELECTROSURGICAL) ×1 IMPLANT
GAUZE SPONGE 4X4 12PLY STRL LF (GAUZE/BANDAGES/DRESSINGS) ×2 IMPLANT
GAUZE XEROFORM 5X9 LF (GAUZE/BANDAGES/DRESSINGS) ×2 IMPLANT
GLOVE BIO SURGEON STRL SZ7 (GLOVE) ×5 IMPLANT
GLOVE BIO SURGEON STRL SZ7.5 (GLOVE) ×2 IMPLANT
GLOVE BIOGEL PI IND STRL 6 (GLOVE) IMPLANT
GLOVE BIOGEL PI IND STRL 7.5 (GLOVE) ×1 IMPLANT
GLOVE BIOGEL PI IND STRL 8 (GLOVE) IMPLANT
GLOVE BIOGEL PI INDICATOR 6 (GLOVE) ×2
GLOVE BIOGEL PI INDICATOR 7.5 (GLOVE) ×6
GLOVE BIOGEL PI INDICATOR 8 (GLOVE) ×2
GLOVE SURG SS PI 7.5 STRL IVOR (GLOVE) ×4 IMPLANT
GOWN STRL REUS W/ TWL LRG LVL3 (GOWN DISPOSABLE) ×3 IMPLANT
GOWN STRL REUS W/TWL LRG LVL3 (GOWN DISPOSABLE) ×12
HANDPIECE INTERPULSE COAX TIP (DISPOSABLE) ×3
HEMOSTAT SPONGE AVITENE ULTRA (HEMOSTASIS) IMPLANT
KIT BASIN OR (CUSTOM PROCEDURE TRAY) ×3 IMPLANT
KIT ROOM TURNOVER OR (KITS) ×3 IMPLANT
NS IRRIG 1000ML POUR BTL (IV SOLUTION) ×3 IMPLANT
PACK CV ACCESS (CUSTOM PROCEDURE TRAY) ×3 IMPLANT
PAD ARMBOARD 7.5X6 YLW CONV (MISCELLANEOUS) ×6 IMPLANT
SET HNDPC FAN SPRY TIP SCT (DISPOSABLE) IMPLANT
STAPLER VISISTAT 35W (STAPLE) IMPLANT
SUT ETHILON O TP 1 (SUTURE) ×4 IMPLANT
SUT MNCRL AB 4-0 PS2 18 (SUTURE) ×3 IMPLANT
SUT PROLENE 5 0 C 1 24 (SUTURE) IMPLANT
SUT PROLENE 6 0 BV (SUTURE) ×4 IMPLANT
SUT PROLENE 7 0 BV 1 (SUTURE) ×1 IMPLANT
SUT VIC AB 3-0 SH 27 (SUTURE) ×3
SUT VIC AB 3-0 SH 27X BRD (SUTURE) ×1 IMPLANT
SUT VICRYL 4-0 PS2 18IN ABS (SUTURE) ×2 IMPLANT
SUT VICRYL RAPIDE 4/0 PS 2 (SUTURE) ×2 IMPLANT
TAPE CLOTH SURG 4X10 WHT LF (GAUZE/BANDAGES/DRESSINGS) ×2 IMPLANT
UNDERPAD 30X30 (UNDERPADS AND DIAPERS) ×3 IMPLANT
WATER STERILE IRR 1000ML POUR (IV SOLUTION) ×3 IMPLANT

## 2016-08-23 NOTE — ED Notes (Signed)
Sutures unsuccessful in controlling bleeding. MD to consult vascular

## 2016-08-23 NOTE — Op Note (Signed)
08/23/2016  5:03 AM  PATIENT:  Frank Warner  36 y.o. male  PRE-OPERATIVE DIAGNOSIS:  Impending compartment syndrome right forearm  POST-OPERATIVE DIAGNOSIS:  Same  PROCEDURE:   1. Right forearm volar decompressive fasciotomy    2.  Right distal volar forearm traumatic wound excisional debridement of skin    3.  Right distal forearm traumatic wound closure, intermediate, 5 cm  SURGEON: Cliffton Astersavid A. Janee Mornhompson, MD  PHYSICIAN ASSISTANT: Dr. Leonides SakeBrian Chen  ANESTHESIA:  general  SPECIMENS:  None  DRAINS:   None  EBL:  less than 50 mL  PREOPERATIVE INDICATIONS:  Frank Warner is a  36 y.o. male with an arterial injury in the distal forearm that has been ligated, but you also had some prolonged tourniquet time for surgery and has developed swelling and some tightness of the forearm musculature.  Intraoperative consultation was sought, we decided to proceed with volar forearm fasciotomy  OPERATIVE IMPLANTS: None  OPERATIVE PROCEDURE: The patient was found in the operating room under general anesthesia with the right arm having been prepped and draped.  There was a transverse distal laceration some 4-5 cm length.  Starting at the ulnar margin of this wound, a sweeping curvilinear incision was made over the volar surface of the forearm approaching the antecubital region.  The skin was incised sharply with a scalpel, subcutaneous taste tissues were dissected with, is to blunt spreading and sharp dissection, dividing the fascia and ensuring that all of the involved epimysium and subcutaneous compartments were released.  There was some significant tightness in the region of the previous hemorrhage, but the fingers were more relaxed following release.  The laceration margins were debrided and the traumatic laceration closed with 4-0 Vicryl Rapide running horizontal mattress suture.  0 nylon suture was placed throughout the length of the fasciotomy, partially reapproximating the skin.  It was tied in the  middle with a bowknot.  Bacitracin on the was placed into the open wound followed by Xeroform and a dry gauze dressing.  He was taken to the recovery room in stable condition, breathing spontaneously  DISPOSITION: He'll be kept for the next 24 hours or so, likely discharged home on Wednesday morning for planned fasciotomy wound closure as an outpatient.

## 2016-08-23 NOTE — Transfer of Care (Signed)
Immediate Anesthesia Transfer of Care Note  Patient: Frank Warner  Procedure(s) Performed: Procedure(s): Ligation Right Ulnar Artery  (Right) Exploration Right  ForeArm with Right Forearm Fasciotomy (Right)  Patient Location: PACU  Anesthesia Type:General  Level of Consciousness: awake  Airway & Oxygen Therapy: Patient Spontanous Breathing  Post-op Assessment: Report given to RN and Post -op Vital signs reviewed and stable  Post vital signs: Reviewed and stable  Last Vitals:  Vitals:   08/23/16 0030 08/23/16 0115  BP: (!) 143/99 (!) 134/96  Pulse: 86 (!) 101  Resp: 18   Temp: 37.6 C     Last Pain:  Vitals:   08/23/16 0030  TempSrc: Oral  PainSc:          Complications: No apparent anesthesia complications
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
## 2016-08-23 NOTE — Progress Notes (Signed)
Spoke with patient and family about [facility].  They understand.  My office will contact them to arrange this.  OK to d/c anytime per hand surgery.  Neil Crouchave Naveen Lorusso, MD Hand Surgery Mobile [phone]
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
## 2016-08-23 NOTE — ED Notes (Signed)
OR team being called in at this time, pt evaluated by Dr. Imogene Burnhen in ED, Consent signed. Family at bedside taking belongings.  Pt last food 2200, provided urinal to empty bladder.

## 2016-08-23 NOTE — Anesthesia Procedure Notes (Signed)
Procedure Name: Intubation Date/Time: 08/23/2016 3:01 AM Performed by: Brien MatesMAHONY, Draylon Mercadel D Pre-anesthesia Checklist: Patient identified, Emergency Drugs available, Suction available, Patient being monitored and Timeout performed Patient Re-evaluated:Patient Re-evaluated prior to inductionOxygen Delivery Method: Circle system utilized Preoxygenation: Pre-oxygenation with 100% oxygen Intubation Type: IV induction, Rapid sequence and Cricoid Pressure applied Laryngoscope Size: Miller and 2 Grade View: Grade I Tube type: Oral Tube size: 7.5 mm Number of attempts: 1 Airway Equipment and Method: Stylet Placement Confirmation: ETT inserted through vocal cords under direct vision,  positive ETCO2 and breath sounds checked- equal and bilateral Secured at: 22 cm Tube secured with: Tape Dental Injury: Teeth and Oropharynx as per pre-operative assessment

## 2016-08-23 NOTE — Progress Notes (Addendum)
  [facility] Progress Note  Subjective  Day of Surgery  Having some pain right arm.   Objective Vitals:   08/23/16 0900 08/23/16 1006  BP: (!) 136/95 138/84  Pulse: 84 65  Resp:  16  Temp: 98.1 F (36.7 C) 98.3 F (36.8 C)    Intake/Output Summary (Last 24 hours) at 08/23/16 1109 Last data filed at 08/23/16 0511  Gross per 24 hour  Intake             1500 ml  Output              150 ml  Net             1350 ml   Right arm dressed. Actively moving all fingers right hand.   Assessment/Planning: 36 y.o. male is s/p: exploration of right wrist and ligation of right ulnar artery, right forearm volar decompressive fasciotomy, right distal forearm traumatic wound closure for laceration of right wrist with uncontrolled hemorrhage.  Day of Surgery   Has regained motor function of right 4th and 5th fingers. Moving all fingers on exam currently. Will have OT evaluate.  Keep here today and evaluate neuro function tomorrow. If remains stable, will discharge and patient to return for fasciotomy closure with Dr. Janee Mornhompson on Friday.   Raymond GurneyKimberly A Trinh 08/23/2016 11:09 AM --  Laboratory CBC    Component Value Date/Time   WBC 10.4 08/23/2016 0530   HGB 14.8 08/23/2016 0530   HCT 44.1 08/23/2016 0530   PLT 206 08/23/2016 0530    BMET    Component Value Date/Time   NA 134 (L) 07/29/2014 2155   K 4.2 07/29/2014 2155   CL 99 (L) 07/29/2014 2155   CO2 24 07/29/2014 2155   GLUCOSE 108 (H) 07/29/2014 2155   BUN <5 (L) 07/29/2014 2155   CREATININE 1.23 07/29/2014 2155   CALCIUM 9.1 07/29/2014 2155   GFRNONAA >60 07/29/2014 2155   GFRAA >60 07/29/2014 2155    COAG No results found for: INR, PROTIME No results found for: PTT  Antibiotics Anti-infectives    Start     Dose/Rate Route Frequency Ordered Stop   08/23/16 1500  cefUROXime (ZINACEF) 1.5 g in dextrose 5 % 50 mL IVPB     1.5 g 100 mL/hr over 30 Minutes Intravenous  Once 08/23/16 1018          Maris BergerKimberly Trinh, PA-C [facility] Office: [phone] Pager: [phone] 08/23/2016 11:09 AM  Addendum  I have independently interviewed and examined the patient, and I agree with the physician assistant's findings.  Viable functional right hand now with resolution of inability to extend R 4th and 5th finger.  Let pt recover overnight and discharge tomorrow after Dr. Janee Mornhompson has seen patient and made arrangements for closure of R forearm fasciotomy.  Leonides SakeBrian Chen, MD, FACS [facility] [place] Office: [phone] Pager: [phone]  08/23/2016, 11:34 AM

## 2016-08-23 NOTE — Progress Notes (Signed)
IV consult ordered,  IV in Left antecubital now, constant beeping, site looks okay.  Unable to see other potential sites for IV start .  Assessed  By 2 nurses Dois DavenportSandra, RN &  Eulis CannerKathryn,RN

## 2016-08-23 NOTE — ED Notes (Signed)
pneumatic tourniquet released temporarily per patient request. No immediate bandage saturation noted.
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
## 2016-08-23 NOTE — ED Triage Notes (Signed)
Pt presents via EMS from home with laceration to R inner wrist, pt states he was throwing out trash when felt something stick him. Pt noted blood shooting from wrist.  Tourn placed to R forearm 0005 by EMS. Released on arrival by Dr. Wilkie AyeHorton, blood immediately shooting from wound. Mnemenic tourniquets applied

## 2016-08-23 NOTE — Progress Notes (Signed)
Received to room 2W12 from the [facility].   Alert & oriented, B?p 138/84  HR 72  NSR  sats 99% room air., dressing right arm & wrist.  Governor SpeckingKathryn Yaneisy Wenz, RN

## 2016-08-23 NOTE — Anesthesia Postprocedure Evaluation (Signed)
Anesthesia Post Note  Patient: Annice Pihyren Barra  Procedure(s) Performed: Procedure(s) (LRB): Ligation Right Ulnar Artery  (Right) Exploration Right  ForeArm with Right Forearm Fasciotomy (Right)     Patient location during evaluation: PACU Anesthesia Type: General Level of consciousness: awake and alert Pain management: pain level controlled Vital Signs Assessment: post-procedure vital signs reviewed and stable Respiratory status: spontaneous breathing, nonlabored ventilation, respiratory function stable and patient connected to nasal cannula oxygen Cardiovascular status: blood pressure returned to baseline and stable Postop Assessment: no signs of nausea or vomiting Anesthetic complications: no    Last Vitals:  Vitals:   08/23/16 1006 08/23/16 1410  BP: 138/84 122/67  Pulse: 65 (!) 58  Resp: 16 18  Temp: 36.8 C 36.9 C    Last Pain:  Vitals:   08/23/16 1717  TempSrc:   PainSc: 5                  Remi Rester DAVID

## 2016-08-23 NOTE — Op Note (Addendum)
OPERATIVE NOTE   PROCEDURE: 1. Exploration of right wrist 2. Ligation of right ulnar artery  SEPARATE PROCEDURE (Dr. Janee Morn) 1.  Right forearm volar decompressive fasciotomy 2.  Right distal volar forearm traumatic wound excisional debridement of skin 3.  Right distal forearm traumatic wound closure, intermediate, 5 cm  PRE-OPERATIVE DIAGNOSIS: laceration of right wrist with uncontrolled hemorrhage  POST-OPERATIVE DIAGNOSIS: same as above   SURGEON: Leonides Sake, MD;   ASSISTANT: Leonides Sake, MD (for Dr. Carollee Massed portion of case)  ANESTHESIA: general  ESTIMATED BLOOD LOSS: 100 cc  FINDING(S): 1.  Transected ulnar artery: 1-2 mm in diameter, actively bleeding  2.  Palpable radial pulse with intact palmar arch 3.  20 cc of hematoma deep in laceration 4.  Tense right forearm with additional hematoma in volar compartment upon exploration (see Dr. Carollee Massed Op Note) 5.  Intact ulnar nerve 6.  Intact tendons  SPECIMEN(S):  none  INDICATIONS:   Frank Warner is a 36 y.o. male who presents with uncontrolled bleeding from a laceration in right wrist while dumping trash.  Due to uncontrolled bleeding despite tourniquet use, I recommended: right arm exploration and possible repair of ulnar artery.  The patient is aware the risks include but are not limited to: bleeding, infection, myocardial infarction, stroke, limb loss, nerve damage, limb edema, need for additional procedures in the future, wound complications, and inability to repair any arteries thus requiring ligation.   DESCRIPTION: After obtaining full informed written consent, the patient was brought back to the operating room and placed supine upon the operating table.  The patient received IV antibiotics prior to induction.  A procedure time out was completed and the correct surgical site was verified.  After obtaining adequate anesthesia, the patient was prepped and draped in the standard fashion for: right arm access.  I  place a retractor in the transverse laceration and almost immediately acute thrombus was expelled from the wrist wound followed by active bleeding.  I obtained a sterile tourniquet and inflated it on the forearm to control bleeding.  I replaced the retractor and identified what appeared to the proximal end a transected ulnar artery.  This artery was only 1-2 mm in diameter, so I did not feel it was likely to remain patent if reconstructed, so I tied off this artery with a 3-0 silk tie.  I then explored for the distal end of the ulnar but did not find this end.  At this point, I washed out the wound with 3 L of sterile normal saline with a pulsavac.  There remained no active bleeding.  In bluntly exploring the wound, I noticed the injury extended deeper than expected.  In this process, I evacuated another 15 cc of hematoma.  After removing the tourniquet, I noticed that the forearm appeared to be tense.  Now finger 3 through 5 appeared to be abnormally contracted.  I felt that an early compartment syndrome might be occurring.  I had Dr. Janee Morn (Hand) evaluate the patient intra-operatively.  He agreed to perform a forearm fasciotomy, see his notes for details.  I assisted him with his case.  Of note, during his forearm fasciotomy, I identified the distal end of the ulnar artery, which appeared to be 1 mm in diameter.  This was double clipped.  The forearm fasciotomy was partially closed by Dr. Janee Morn with the plan to close this forearm in 2-3 days, see Dr. Carollee Massed note for details.     COMPLICATIONS: none  CONDITION: stable  Leonides SakeBrian Chen, MD, FACS [facility] [place] Office: [phone] Pager: [phone]  08/23/2016, 5:02 AM

## 2016-08-23 NOTE — Anesthesia Preprocedure Evaluation (Addendum)
Anesthesia Evaluation  Patient identified by MRN, date of birth, ID band Patient awake    Reviewed: Allergy & Precautions, NPO status , Patient's Chart, lab work & pertinent test results  Airway Mallampati: I  TM Distance: >3 FB Neck ROM: Full    Dental  (+) Dental Advisory Given, Teeth Intact   Pulmonary Current Smoker,    Pulmonary exam normal        Cardiovascular Normal cardiovascular exam     Neuro/Psych    GI/Hepatic   Endo/Other    Renal/GU      Musculoskeletal   Abdominal   Peds  Hematology   Anesthesia Other Findings   Reproductive/Obstetrics                            Anesthesia Physical Anesthesia Plan  ASA: II and emergent  Anesthesia Plan: General   Post-op Pain Management:    Induction: Intravenous, Rapid sequence and Cricoid pressure planned  PONV Risk Score and Plan: 1 and Ondansetron and Dexamethasone  Airway Management Planned:   Additional Equipment:   Intra-op Plan:   Post-operative Plan: Extubation in OR  Informed Consent: I have reviewed the patients History and Physical, chart, labs and discussed the procedure including the risks, benefits and alternatives for the proposed anesthesia with the patient or authorized representative who has indicated his/her understanding and acceptance.   Dental advisory given  Plan Discussed with: CRNA, Surgeon and Anesthesiologist  Anesthesia Plan Comments:        Anesthesia Quick Evaluation

## 2016-08-24 ENCOUNTER — Encounter (HOSPITAL_COMMUNITY): Payer: Self-pay | Admitting: Vascular Surgery

## 2016-08-24 ENCOUNTER — Other Ambulatory Visit: Payer: Self-pay | Admitting: Orthopedic Surgery

## 2016-08-24 MED ORDER — OXYCODONE HCL 5 MG PO TABS: 5.0000 mg | ORAL_TABLET | Freq: Four times a day (QID) | ORAL | 0 refills | Status: DC | PRN

## 2016-08-24 NOTE — Discharge Summary (Signed)
Discharge Summary    Frank Warner 01/19/1981 36 y.o. male  [id number]  Admission Date: 08/23/2016  Discharge Date: 08/24/16  Physician: Fransisco Hertzhen, Pape Parson L, MD  Admission Diagnosis: Laceration of right wrist, initial encounter [S61.511A] Injury of right ulnar artery, initial encounter [S55.001A]   HPI:   This is a 36 y.o. male who presents with cc: uncontrolled hemorrhage from right wrist.  MOI: pt is unclear what happened but cut himself on garbage can.  He had uncontrolled bleeding in ED requiring tourniquet use for over one hour.  The patient requested release of the tourniquet after which he reportedly had full return of use of right hand hand along with sensation.  The patient got a tetanus booster while in the ED.    By the time, I saw the patient he was complaining of pain when trying to extend his R 4th and 5th fingers.  He denied any loss of sensation once the tourniquet was deflated.  Hospital Course:  The patient was admitted to the hospital and taken to the operating room on 08/23/2016 and underwent:  PROCEDURE: (Dr. Imogene Burnhen) 1. Exploration of right wrist Ligation of right ulnar artery   And SEPARATE PROCEDURE (Dr. Janee Mornhompson) 1.  Right forearm volar decompressive fasciotomy 2. Right distal volar forearm traumatic wound excisional debridement of skin 3. Right distal forearm traumatic wound closure, intermediate, 5 cm  Intraoperative findings: FINDING(S): 1.  Transected ulnar artery: 1-2 mm in diameter, actively bleeding  2.  Palpable radial pulse with intact palmar arch 3.  20 cc of hematoma deep in laceration 4.  Tense right forearm with additional hematoma in volar compartment upon exploration (see Dr. Carollee Massedhompson's Op Note) 5.  Intact ulnar nerve 6.  Intact tendons  The pt tolerated the procedure well and was transported to the PACU in good condition.   By POD 1, the pt was doing well.  He had regained motor function of the right 4th and 5th fingers.  He was moving  all fingers on exam.    Ortho Spoke with patient and family about plan to [facility].  They understand.  My office will contact them to arrange this.  OK to d/c anytime per hand surgery.    He will be evaluated by OT prior to discharge.  No OT follow up was recommended.  He will keep dressing in place until seeing ortho later this week.   The remainder of the hospital course consisted of increasing mobilization and increasing intake of solids without difficulty.  CBC    Component Value Date/Time   WBC 12.7 (H) 08/23/2016 1110   RBC 4.42 08/23/2016 1110   HGB 14.1 08/23/2016 1110   HCT 41.7 08/23/2016 1110   PLT 184 08/23/2016 1110   MCV 94.3 08/23/2016 1110   MCH 31.9 08/23/2016 1110   MCHC 33.8 08/23/2016 1110   RDW 14.7 08/23/2016 1110   LYMPHSABS 2.1 07/29/2014 2044   MONOABS 2.4 (H) 07/29/2014 2044   EOSABS 0.0 07/29/2014 2044   BASOSABS 0.0 07/29/2014 2044    BMET    Component Value Date/Time   NA 142 08/23/2016 0236   K 3.7 08/23/2016 0236   CL 99 (L) 07/29/2014 2155   CO2 24 07/29/2014 2155   GLUCOSE 117 (H) 08/23/2016 0236   BUN <5 (L) 07/29/2014 2155   CREATININE 1.10 08/23/2016 1110   CALCIUM 9.1 07/29/2014 2155   GFRNONAA >60 08/23/2016 1110   GFRAA >60 08/23/2016 1110  Discharge Instructions    Call MD for:  redness, tenderness, or signs of infection (pain, swelling, bleeding, redness, odor or green/yellow discharge around incision site)    Complete by:  As directed    Call MD for:  severe or increased pain, loss or decreased feeling  in affected limb(s)    Complete by:  As directed    Call MD for:  temperature >100.5    Complete by:  As directed    Discharge instructions    Complete by:  As directed    Leave dressing in place until seeing orthopedics.   Driving Restrictions    Complete by:  As directed    No driving for 2 weeks and while taking pain medication   Lifting restrictions    Complete by:  As directed     No heavy lifting for 2 weeks   Resume previous diet    Complete by:  As directed       Discharge Diagnosis:  Laceration of right wrist, initial encounter [S61.511A] Injury of right ulnar artery, initial encounter [S55.001A]  Secondary Diagnosis: Patient Active Problem List   Diagnosis Date Noted  . Ulnar artery injury, right, initial encounter 08/23/2016  . Cellulitis and abscess of face 07/30/2014  . Cellulitis of face 07/29/2014  . Odontogenic infection of jaw 07/29/2014  . Dental caries 07/29/2014   Past Medical History:  Diagnosis Date  . Migraine headache      Allergies as of 08/24/2016   No Known Allergies     Medication List    TAKE these medications   acetaminophen 500 MG tablet Commonly known as:  TYLENOL Take 1,000 mg by mouth every 6 (six) hours as needed for headache.   ibuprofen 200 MG tablet Commonly known as:  ADVIL,MOTRIN Take 800 mg by mouth every 6 (six) hours as needed for headache.   oxyCODONE 5 MG immediate release tablet Commonly known as:  ROXICODONE Take 1 tablet (5 mg total) by mouth every 6 (six) hours as needed for severe pain.       Prescriptions given: Roxicet #30 No Refill  Instructions: 1.  No driving for 2 weeks and while taking pain medication 2.  No heavy lifting x 2 weeks  Disposition: home  Patient's condition: is Good  Follow up: 1. Dr. Imogene Burn in 2-3 weeks 2. Dr. Janee Morn within the week (their office will call to arrange)   Doreatha Massed, PA-C [facility] [phone] 08/24/2016  7:58 AM  Addendum  Pt underwent ligation of right ulnar artery after one hour tourniquet time in ED failed to control bleeding after injury to right wrist.  Due to developing compartment syndrome intra-operatively, I requested Hand Surgery (Dr. Janee Morn) to scrub in and perform a decompressive fasciotomy.   This was done without obvious complications.  The patient regained full use of his right hand.  He was  discharged with wound care instruction per Hand Surgery.  He is scheduled for closure of his fasciotomy on Friday with Dr. Janee Morn.  The patient will follow up with me in 2-3 weeks.   Leonides Sake, MD, FACS [facility] [place] Office: [phone] Pager: [phone]  09/04/2016, 9:32 PM

## 2016-08-24 NOTE — Evaluation (Signed)
Occupational Therapy Evaluation and Discharge Patient Details Name: Lino Wickliff MRN: [id number] DOB: 1980-04-20 Today's Date: 08/24/2016    History of Present Illness s/p exploration R wrist and ligation of R ulnar artery, decompressive fasciotomy, closure of traumatic wound    Clinical Impression   Pt is independent in ADL. Educated in edema management and to avoid any heavy use of R UE. Instructed in how to cover R UE to avoid moisture with showering. Pt verbalizing understanding. No further OT needs.    Follow Up Recommendations  No OT follow up    Equipment Recommendations  None recommended by OT    Recommendations for Other Services       Precautions / Restrictions Precautions Precautions: None      Mobility Bed Mobility Overal bed mobility: Independent                Transfers Overall transfer level: Independent                    Balance                                           ADL either performed or assessed with clinical judgement   ADL Overall ADL's : Independent                                       General ADL Comments: Instructed in how to cover wound to avoid getting wet with showering. Pt has assist at home for dressing change.     Vision Baseline Vision/History: No visual deficits       Perception     Praxis      Pertinent Vitals/Pain Pain Assessment: Faces Faces Pain Scale: Hurts even more Pain Location: R UE Pain Descriptors / Indicators: Sore Pain Intervention(s): Repositioned;RN gave pain meds during session     Hand Dominance Right   Extremity/Trunk Assessment Upper Extremity Assessment Upper Extremity Assessment: RUE deficits/detail RUE Deficits / Details: full AROM and use, educated pt to only use as light assist and avoid lifting, pushing and pulling, instructed to elevate above heart and perform AROM on R hand throughout the day for edema management    Lower Extremity  Assessment Lower Extremity Assessment: Overall WFL for tasks assessed       Communication Communication Communication: No difficulties   Cognition Arousal/Alertness: Awake/alert Behavior During Therapy: WFL for tasks assessed/performed Overall Cognitive Status: Within Functional Limits for tasks assessed                                     General Comments       Exercises     Shoulder Instructions      Home Living Family/patient expects to be discharged to:: Private residence Living Arrangements: Other relatives Available Help at Discharge: Available PRN/intermittently                                    Prior Functioning/Environment Level of Independence: Independent                 OT Problem List: Pain      OT Treatment/Interventions:  OT Goals(Current goals can be found in the care plan section) Acute Rehab OT Goals Patient Stated Goal: to go home  OT Frequency:     Barriers to D/C:            Co-evaluation              AM-PAC PT "6 Clicks" Daily Activity     Outcome Measure Help from another person eating meals?: None Help from another person taking care of personal grooming?: None Help from another person toileting, which includes using toliet, bedpan, or urinal?: None Help from another person bathing (including washing, rinsing, drying)?: None Help from another person to put on and taking off regular upper body clothing?: None Help from another person to put on and taking off regular lower body clothing?: None 6 Click Score: 24   End of Session Nurse Communication:  (ready to go home)  Activity Tolerance: Patient tolerated treatment well Patient left: in bed;with call bell/phone within reach  OT Visit Diagnosis: Pain Pain - Right/Left: Right Pain - part of body: Arm                Time: 0955-1010 OT Time Calculation (min): 15 min Charges:  OT General Charges $OT Visit: 1 Procedure OT  Evaluation $OT Eval Low Complexity: 1 Procedure G-Codes: OT G-codes **NOT FOR INPATIENT CLASS** Functional Assessment Tool Used: Clinical judgement Functional Limitation: Self care Self Care Current Status (W0981(G8987): 0 percent impaired, limited or restricted Self Care Goal Status (X9147(G8988): 0 percent impaired, limited or restricted Self Care Discharge Status (W2956(G8989): 0 percent impaired, limited or restricted   Evern BioMayberry, Bryonna Sundby Lynn 08/24/2016, 10:21 AM  [phone]

## 2016-08-25 ENCOUNTER — Encounter (HOSPITAL_BASED_OUTPATIENT_CLINIC_OR_DEPARTMENT_OTHER): Payer: Self-pay | Admitting: *Deleted

## 2016-08-29 ENCOUNTER — Ambulatory Visit (HOSPITAL_BASED_OUTPATIENT_CLINIC_OR_DEPARTMENT_OTHER): Payer: Self-pay | Admitting: Certified Registered"

## 2016-08-29 ENCOUNTER — Encounter: Payer: Self-pay | Admitting: Vascular Surgery

## 2016-08-29 ENCOUNTER — Encounter (HOSPITAL_BASED_OUTPATIENT_CLINIC_OR_DEPARTMENT_OTHER): Payer: Self-pay | Admitting: Certified Registered"

## 2016-08-29 ENCOUNTER — Encounter (HOSPITAL_BASED_OUTPATIENT_CLINIC_OR_DEPARTMENT_OTHER)
Admission: RE | Disposition: A | Discharge status: Home / Self Care | Payer: Self-pay | Source: Ambulatory Visit | Attending: Orthopedic Surgery

## 2016-08-29 ENCOUNTER — Ambulatory Visit (HOSPITAL_BASED_OUTPATIENT_CLINIC_OR_DEPARTMENT_OTHER)
Admission: RE | Admit: 2016-08-29 | Discharge: 2016-08-29 | Disposition: A | Discharge status: Home / Self Care | Payer: Self-pay | Source: Ambulatory Visit | Attending: Orthopedic Surgery | Admitting: Orthopedic Surgery

## 2016-08-29 DIAGNOSIS — S51802A Unspecified open wound of left forearm, initial encounter: Secondary | ICD-10-CM | POA: Insufficient documentation

## 2016-08-29 DIAGNOSIS — X58XXXA Exposure to other specified factors, initial encounter: Secondary | ICD-10-CM | POA: Insufficient documentation

## 2016-08-29 DIAGNOSIS — E669 Obesity, unspecified: Secondary | ICD-10-CM | POA: Insufficient documentation

## 2016-08-29 DIAGNOSIS — Z6832 Body mass index (BMI) 32.0-32.9, adult: Secondary | ICD-10-CM | POA: Insufficient documentation

## 2016-08-29 DIAGNOSIS — F17200 Nicotine dependence, unspecified, uncomplicated: Secondary | ICD-10-CM | POA: Insufficient documentation

## 2016-08-29 DIAGNOSIS — I739 Peripheral vascular disease, unspecified: Secondary | ICD-10-CM | POA: Insufficient documentation

## 2016-08-29 HISTORY — DX: Cluster headache syndrome, unspecified, not intractable: G44.009

## 2016-08-29 HISTORY — PX: FASCIOTOMY CLOSURE: SHX5829

## 2016-08-29 HISTORY — DX: Puncture wound without foreign body of unspecified forearm, initial encounter: S51.839A

## 2016-08-29 SURGERY — FASCIOTOMY CLOSURE
Anesthesia: General | Site: Arm Lower | Laterality: Right

## 2016-08-29 MED ORDER — BUPIVACAINE-EPINEPHRINE 0.5% -1:200000 IJ SOLN
INTRAMUSCULAR | Status: DC | PRN
  Administered 2016-08-29: 20 mL

## 2016-08-29 MED ORDER — FENTANYL CITRATE (PF) 100 MCG/2ML IJ SOLN
INTRAMUSCULAR | Status: AC
  Filled 2016-08-29: qty 2

## 2016-08-29 MED ORDER — DEXAMETHASONE SODIUM PHOSPHATE 10 MG/ML IJ SOLN
INTRAMUSCULAR | Status: DC | PRN
  Administered 2016-08-29: 10 mg via INTRAVENOUS

## 2016-08-29 MED ORDER — SCOPOLAMINE 1 MG/3DAYS TD PT72: 1.0000 | MEDICATED_PATCH | Freq: Once | TRANSDERMAL | Status: DC | PRN

## 2016-08-29 MED ORDER — PROPOFOL 500 MG/50ML IV EMUL
INTRAVENOUS | Status: AC
  Filled 2016-08-29: qty 150

## 2016-08-29 MED ORDER — ACETAMINOPHEN 325 MG PO TABS: 650.0000 mg | ORAL_TABLET | Freq: Four times a day (QID) | ORAL | Status: DC | PRN

## 2016-08-29 MED ORDER — ONDANSETRON HCL 4 MG/2ML IJ SOLN
INTRAMUSCULAR | Status: AC
  Filled 2016-08-29: qty 10

## 2016-08-29 MED ORDER — DEXAMETHASONE SODIUM PHOSPHATE 10 MG/ML IJ SOLN
INTRAMUSCULAR | Status: AC
  Filled 2016-08-29: qty 3

## 2016-08-29 MED ORDER — LIDOCAINE 2% (20 MG/ML) 5 ML SYRINGE
INTRAMUSCULAR | Status: AC
  Filled 2016-08-29: qty 15

## 2016-08-29 MED ORDER — MEPERIDINE HCL 25 MG/ML IJ SOLN: 6.2500 mg | INTRAMUSCULAR | Status: DC | PRN

## 2016-08-29 MED ORDER — CEFAZOLIN SODIUM-DEXTROSE 2-4 GM/100ML-% IV SOLN
INTRAVENOUS | Status: AC
  Filled 2016-08-29: qty 100

## 2016-08-29 MED ORDER — HYDROCODONE-ACETAMINOPHEN 5-325 MG PO TABS
ORAL_TABLET | ORAL | Status: AC
  Filled 2016-08-29: qty 1

## 2016-08-29 MED ORDER — LACTATED RINGERS IV SOLN: INTRAVENOUS | Status: DC

## 2016-08-29 MED ORDER — ONDANSETRON HCL 4 MG/2ML IJ SOLN
INTRAMUSCULAR | Status: DC | PRN
  Administered 2016-08-29: 4 mg via INTRAVENOUS

## 2016-08-29 MED ORDER — MIDAZOLAM HCL 2 MG/2ML IJ SOLN
INTRAMUSCULAR | Status: AC
  Filled 2016-08-29: qty 2

## 2016-08-29 MED ORDER — CEFAZOLIN SODIUM-DEXTROSE 2-4 GM/100ML-% IV SOLN
2.0000 g | INTRAVENOUS | Status: AC
  Administered 2016-08-29: 2 g via INTRAVENOUS

## 2016-08-29 MED ORDER — HYDROMORPHONE HCL 1 MG/ML IJ SOLN
INTRAMUSCULAR | Status: AC
  Filled 2016-08-29: qty 0.5

## 2016-08-29 MED ORDER — PROMETHAZINE HCL 25 MG/ML IJ SOLN: 6.2500 mg | INTRAMUSCULAR | Status: DC | PRN

## 2016-08-29 MED ORDER — HYDROCODONE-ACETAMINOPHEN 7.5-325 MG PO TABS: 1.0000 | ORAL_TABLET | Freq: Once | ORAL | Status: DC | PRN

## 2016-08-29 MED ORDER — OXYCODONE HCL 5 MG PO TABS: 5.0000 mg | ORAL_TABLET | Freq: Four times a day (QID) | ORAL | 0 refills | Status: DC | PRN

## 2016-08-29 MED ORDER — HYDROMORPHONE HCL 1 MG/ML IJ SOLN
0.2500 mg | INTRAMUSCULAR | Status: DC | PRN
  Administered 2016-08-29: 0.5 mg via INTRAVENOUS

## 2016-08-29 MED ORDER — LACTATED RINGERS IV SOLN
INTRAVENOUS | Status: DC
  Administered 2016-08-29 (×2): via INTRAVENOUS

## 2016-08-29 MED ORDER — FENTANYL CITRATE (PF) 100 MCG/2ML IJ SOLN
50.0000 ug | INTRAMUSCULAR | Status: AC | PRN
  Administered 2016-08-29: 100 ug via INTRAVENOUS
  Administered 2016-08-29 (×2): 50 ug via INTRAVENOUS

## 2016-08-29 MED ORDER — LIDOCAINE HCL (CARDIAC) 20 MG/ML IV SOLN
INTRAVENOUS | Status: DC | PRN
  Administered 2016-08-29: 60 mg via INTRAVENOUS

## 2016-08-29 MED ORDER — PROPOFOL 10 MG/ML IV BOLUS
INTRAVENOUS | Status: DC | PRN
  Administered 2016-08-29: 200 mg via INTRAVENOUS

## 2016-08-29 MED ORDER — MIDAZOLAM HCL 2 MG/2ML IJ SOLN
1.0000 mg | INTRAMUSCULAR | Status: DC | PRN
  Administered 2016-08-29: 2 mg via INTRAVENOUS

## 2016-08-29 MED ORDER — IBUPROFEN 200 MG PO TABS: 600.0000 mg | ORAL_TABLET | Freq: Four times a day (QID) | ORAL | 0 refills | Status: DC | PRN

## 2016-08-29 SURGICAL SUPPLY — 50 items
BLADE MINI RND TIP GREEN BEAV (BLADE) IMPLANT
BLADE SURG 15 STRL LF DISP TIS (BLADE) ×2 IMPLANT
BLADE SURG 15 STRL SS (BLADE) ×4
BNDG CMPR 9X4 STRL LF SNTH (GAUZE/BANDAGES/DRESSINGS)
BNDG COHESIVE 4X5 TAN STRL (GAUZE/BANDAGES/DRESSINGS) ×4 IMPLANT
BNDG ESMARK 4X9 LF (GAUZE/BANDAGES/DRESSINGS) IMPLANT
BNDG GAUZE 1X2.1 STRL (MISCELLANEOUS) IMPLANT
BNDG GAUZE ELAST 4 BULKY (GAUZE/BANDAGES/DRESSINGS) ×4 IMPLANT
CANISTER SUCTION 1200CC (MISCELLANEOUS) ×3 IMPLANT
CHLORAPREP W/TINT 26ML (MISCELLANEOUS) ×1 IMPLANT
CORD BIPOLAR FORCEPS 12FT (ELECTRODE) ×3 IMPLANT
COVER BACK TABLE 60X90IN (DRAPES) ×4 IMPLANT
COVER MAYO STAND STRL (DRAPES) ×4 IMPLANT
CUFF TOURNIQUET SINGLE 18IN (TOURNIQUET CUFF) ×3 IMPLANT
DRAIN PENROSE 1/2X12 LTX STRL (WOUND CARE) IMPLANT
DRAPE EXTREMITY T 121X128X90 (DRAPE) ×4 IMPLANT
DRAPE SURG 17X23 STRL (DRAPES) ×4 IMPLANT
DRSG EMULSION OIL 3X3 NADH (GAUZE/BANDAGES/DRESSINGS) ×1 IMPLANT
ELECT REM PT RETURN 9FT ADLT (ELECTROSURGICAL) ×4
ELECTRODE REM PT RTRN 9FT ADLT (ELECTROSURGICAL) ×1 IMPLANT
GAUZE SPONGE 4X4 12PLY STRL LF (GAUZE/BANDAGES/DRESSINGS) ×4 IMPLANT
GLOVE BIO SURGEON STRL SZ7.5 (GLOVE) ×4 IMPLANT
GLOVE BIOGEL PI IND STRL 7.0 (GLOVE) ×4 IMPLANT
GLOVE BIOGEL PI IND STRL 8 (GLOVE) ×2 IMPLANT
GLOVE BIOGEL PI INDICATOR 7.0 (GLOVE) ×6
GLOVE BIOGEL PI INDICATOR 8 (GLOVE) ×2
GLOVE ECLIPSE 6.5 STRL STRAW (GLOVE) ×7 IMPLANT
GOWN STRL REUS W/ TWL LRG LVL3 (GOWN DISPOSABLE) ×4 IMPLANT
GOWN STRL REUS W/TWL LRG LVL3 (GOWN DISPOSABLE) ×8
GOWN STRL REUS W/TWL XL LVL3 (GOWN DISPOSABLE) ×4 IMPLANT
NDL HYPO 25X1 1.5 SAFETY (NEEDLE) IMPLANT
NEEDLE HYPO 25X1 1.5 SAFETY (NEEDLE) ×4 IMPLANT
NS IRRIG 1000ML POUR BTL (IV SOLUTION) ×4 IMPLANT
PACK BASIN DAY SURGERY FS (CUSTOM PROCEDURE TRAY) ×4 IMPLANT
PADDING CAST ABS 4INX4YD NS (CAST SUPPLIES) ×2
PADDING CAST ABS COTTON 4X4 ST (CAST SUPPLIES) ×1 IMPLANT
PENCIL BUTTON HOLSTER BLD 10FT (ELECTRODE) ×3 IMPLANT
SPONGE LAP 4X18 X RAY DECT (DISPOSABLE) ×3 IMPLANT
STOCKINETTE 6  STRL (DRAPES) ×2
STOCKINETTE 6 STRL (DRAPES) ×2 IMPLANT
SUT VICRYL RAPIDE 4-0 (SUTURE) IMPLANT
SUT VICRYL RAPIDE 4/0 PS 2 (SUTURE) ×3 IMPLANT
SYR 10ML LL (SYRINGE) ×3 IMPLANT
SYR BULB 3OZ (MISCELLANEOUS) ×4 IMPLANT
TOWEL OR 17X24 6PK STRL BLUE (TOWEL DISPOSABLE) ×4 IMPLANT
TOWEL OR NON WOVEN STRL DISP B (DISPOSABLE) ×4 IMPLANT
TUBE CONNECTING 20'X1/4 (TUBING) ×1
TUBE CONNECTING 20X1/4 (TUBING) ×2 IMPLANT
UNDERPAD 30X30 (UNDERPADS AND DIAPERS) ×4 IMPLANT
YANKAUER SUCT BULB TIP NO VENT (SUCTIONS) ×3 IMPLANT

## 2016-08-29 NOTE — Op Note (Signed)
08/29/2016  10:08 AM  PATIENT:  Frank Warner  36 y.o. male  PRE-OPERATIVE DIAGNOSIS:  Left forearm fasciotomy wound, 30 cm  POST-OPERATIVE DIAGNOSIS:  Same  PROCEDURE:  Intermediate closure of left forearm fasciotomy wound, 30 cm  SURGEON: Cliffton Astersavid A. Janee Mornhompson, MD  PHYSICIAN ASSISTANT: Danielle RankinKirsten Schrader, OPA-C  ANESTHESIA:  general  SPECIMENS:  None  DRAINS:   None  EBL:  less than 50 mL  PREOPERATIVE INDICATIONS:  Frank Warner is a  36 y.o. male with a left forearm fasciotomy wound, several days old, ready for closure  The risks benefits and alternatives were discussed with the patient preoperatively including but not limited to the risks of infection, bleeding, nerve injury, cardiopulmonary complications, the need for revision surgery, among others, and the patient verbalized understanding and consented to proceed.  OPERATIVE IMPLANTS: None  OPERATIVE PROCEDURE:  After receiving prophylactic antibiotics, the patient was escorted to the operative theatre and placed in a supine position.  General anesthesia was administered.  A surgical "time-out" was performed during which the planned procedure, proposed operative site, and the correct patient identity were compared to the operative consent and agreement confirmed by the circulating nurse according to current facility policy.  The exposed skin and wound were pre-scrubbed with a Hibiclens scrub brush before being formally prepped with Betadine and draped in usual sterile fashion.  The previously placed loose sutures were further loosened and the wound debrided with abrasion, using the back end of an Adson forcep and a sponge with digital scraping.  A few of the skin edges were excisionally debrided.  Clot was removed.  The wound was then copiously irrigated.  The sutures were tightened, and tied.  Additional 4-0 Vicryl Rapide interrupted sutures were placed between some of the running sutures to help evert the skin edges.  These were  interrupted horizontal mattress sutures.  Half percent Marcaine with epinephrine was instilled around the skin edges to help with postoperative pain control and dressing was applied.  He was taken to the recovery room in stable condition, breathing spontaneously.  DISPOSITION: He'll be discharged home today with instructions to begin working on range of motion exercises, showering normally, returning in 10-15 days at which time he will need to have the black nylon running sutures removed.

## 2016-08-29 NOTE — Anesthesia Preprocedure Evaluation (Signed)
Anesthesia Evaluation  Patient identified by MRN, date of birth, ID band Patient awake    Reviewed: Allergy & Precautions, NPO status , Patient's Chart, lab work & pertinent test results  Airway Mallampati: I  TM Distance: >3 FB Neck ROM: Full    Dental  (+) Dental Advisory Given, Teeth Intact   Pulmonary Current Smoker,    Pulmonary exam normal breath sounds clear to auscultation       Cardiovascular + Peripheral Vascular Disease  Normal cardiovascular exam Rhythm:Regular Rate:Normal     Neuro/Psych  Headaches, negative psych ROS   GI/Hepatic negative GI ROS, Neg liver ROS,   Endo/Other  Obesity  Renal/GU negative Renal ROS  negative genitourinary   Musculoskeletal negative musculoskeletal ROS (+) S/P fasciotomy right forearm- for closure today   Abdominal   Peds  Hematology negative hematology ROS (+)   Anesthesia Other Findings   Reproductive/Obstetrics                             Anesthesia Physical  Anesthesia Plan  ASA: II and emergent  Anesthesia Plan: General   Post-op Pain Management:    Induction: Intravenous  PONV Risk Score and Plan: 1 and Ondansetron, Dexamethasone, Propofol and Midazolam  Airway Management Planned: LMA  Additional Equipment:   Intra-op Plan:   Post-operative Plan: Extubation in OR  Informed Consent: I have reviewed the patients History and Physical, chart, labs and discussed the procedure including the risks, benefits and alternatives for the proposed anesthesia with the patient or authorized representative who has indicated his/her understanding and acceptance.   Dental advisory given  Plan Discussed with: CRNA, Surgeon and Anesthesiologist  Anesthesia Plan Comments:         Anesthesia Quick Evaluation

## 2016-08-29 NOTE — Discharge Instructions (Signed)
Discharge Instructions   You have a light dressing on your hand.  You may begin gentle motion of your fingers and hand immediately, but you should not do any heavy lifting or gripping.  Elevate your hand to reduce pain & swelling of the digits.  Ice over the operative site may be helpful to reduce pain & swelling.  DO NOT USE HEAT. Pain medicine has been prescribed for you.  Use ibuprofen 600 mg and 650 mg Tylenol every 6 hours together. Take the Oxycodone 5 mg for severe pain as a rescue medicine. Leave the dressing in place until the third day after your surgery and then remove it, leaving it open to air.  After the bandage has been removed you may shower, regularly washing the incision and letting the water run over it, but not submerging it (no swimming, soaking it in dishwater, etc.) You may drive a car when you are off of prescription pain medications and can safely control your vehicle with both hands. We will address whether therapy will be required or not when you return to the office. You may have already made your follow-up appointment when we completed your preop visit.  If not, please call our office today or the next business day to make your return appointment for 10-14 days after surgery.   Please call [phone] during normal business hours or [phone] after hours for any problems. Including the following:  - excessive redness of the incisions - drainage for more than 4 days - fever of more than 101.5 F  *Please note that pain medications will not be refilled after hours or on weekends.   Post Anesthesia Home Care Instructions  Activity: Get plenty of rest for the remainder of the day. A responsible individual must stay with you for 24 hours following the procedure.  For the next 24 hours, DO NOT: -Drive a car -Advertising copywriterperate machinery -Drink alcoholic beverages -Take any medication unless instructed by your physician -Make any legal decisions or sign important  papers.  Meals: Start with liquid foods such as gelatin or soup. Progress to regular foods as tolerated. Avoid greasy, spicy, heavy foods. If nausea and/or vomiting occur, drink only clear liquids until the nausea and/or vomiting subsides. Call your physician if vomiting continues.  Special Instructions/Symptoms: Your throat may feel dry or sore from the anesthesia or the breathing tube placed in your throat during surgery. If this causes discomfort, gargle with warm salt water. The discomfort should disappear within 24 hours.  If you had a scopolamine patch placed behind your ear for the management of post- operative nausea and/or vomiting:  1. The medication in the patch is effective for 72 hours, after which it should be removed.  Wrap patch in a tissue and discard in the trash. Wash hands thoroughly with soap and water. 2. You may remove the patch earlier than 72 hours if you experience unpleasant side effects which may include dry mouth, dizziness or visual disturbances. 3. Avoid touching the patch. Wash your hands with soap and water after contact with the patch.

## 2016-08-29 NOTE — Anesthesia Procedure Notes (Signed)
Procedure Name: LMA Insertion Date/Time: 08/29/2016 11:07 AM Performed by: Alphonsa Brickle D Pre-anesthesia Checklist: Patient identified, Emergency Drugs available, Suction available and Patient being monitored Patient Re-evaluated:Patient Re-evaluated prior to inductionOxygen Delivery Method: Circle system utilized Preoxygenation: Pre-oxygenation with 100% oxygen Intubation Type: IV induction Ventilation: Mask ventilation without difficulty LMA: LMA inserted LMA Size: 4.0 Number of attempts: 1 Airway Equipment and Method: Bite block Placement Confirmation: positive ETCO2 Tube secured with: Tape Dental Injury: Teeth and Oropharynx as per pre-operative assessment

## 2016-08-29 NOTE — H&P (View-Only) (Signed)
Spoke with patient and family about plan to return to OR for DPC of fasciotomy wound.  They understand.  My office will contact them to arrange this.  OK to d/c anytime per hand surgery.  Dave Shaquala Broeker, MD [facility] [phone] 

## 2016-08-29 NOTE — Interval H&P Note (Signed)
History and Physical Interval Note:  08/29/2016 10:08 AM  Frank Warner  has presented today for surgery, with the diagnosis of RIGHT FOREARM FASCIOTOMY WOUND CLOSURE S51.801S  The various methods of treatment have been discussed with the patient and family. After consideration of risks, benefits and other options for treatment, the patient has consented to  Procedure(s): DELAYED PRIMARY FASCIOTOMY CLOSURE OF RIGHT FOREARM WOUND (Right) as a surgical intervention .  The patient's history has been reviewed, patient examined, no change in status, stable for surgery.  I have reviewed the patient's chart and labs.  Questions were answered to the patient's satisfaction.     [name] A.

## 2016-08-29 NOTE — Anesthesia Postprocedure Evaluation (Signed)
Anesthesia Post Note  Patient: Psychologist, sport and exerciseTyren Warner  Procedure(s) Performed: Procedure(s) (LRB): DELAYED PRIMARY FASCIOTOMY CLOSURE OF RIGHT FOREARM WOUND (Right)     Patient location during evaluation: PACU Anesthesia Type: General Level of consciousness: awake and alert Pain management: pain level controlled Vital Signs Assessment: post-procedure vital signs reviewed and stable Respiratory status: spontaneous breathing, nonlabored ventilation and respiratory function stable Cardiovascular status: blood pressure returned to baseline and stable Postop Assessment: no signs of nausea or vomiting Anesthetic complications: no    Last Vitals:  Vitals:   08/29/16 1200 08/29/16 1215  BP: 138/85 132/85  Pulse: (!) 102 88  Resp: 13 18  Temp:      Last Pain:  Vitals:   08/29/16 1213  TempSrc:   PainSc: 4                  [name] A.

## 2016-08-29 NOTE — Transfer of Care (Signed)
Immediate Anesthesia Transfer of Care Note  Patient: Frank Warner  Procedure(s) Performed: Procedure(s): DELAYED PRIMARY FASCIOTOMY CLOSURE OF RIGHT FOREARM WOUND (Right)  Patient Location: PACU  Anesthesia Type:General  Level of Consciousness: awake, alert , oriented and patient cooperative  Airway & Oxygen Therapy: Patient Spontanous Breathing and Patient connected to face mask oxygen  Post-op Assessment: Report given to RN and Post -op Vital signs reviewed and stable  Post vital signs: Reviewed and stable  Last Vitals:  Vitals:   08/29/16 1150 08/29/16 1151  BP: (!) 153/81   Pulse: (!) 115 (!) 117  Resp:  (!) 23  Temp:      Last Pain:  Vitals:   08/29/16 0927  TempSrc: Oral  PainSc: 10-Worst pain ever         Complications: No apparent anesthesia complications

## 2016-08-30 ENCOUNTER — Telehealth: Payer: Self-pay | Admitting: Vascular Surgery

## 2016-08-30 NOTE — Telephone Encounter (Signed)
-----   Message from Sharee PimpleMarilyn K McChesney, RN sent at 08/24/2016  8:48 AM EDT ----- Regarding: 2-3 weeks   ----- Message ----- From: Dara Lordshyne, Samantha J, PA-C Sent: 08/24/2016   7:57 AM To: Vvs Charge Pool  S/p PROCEDURE: 1. Exploration of right wrist 2. Ligation of right ulnar artery  SEPARATE PROCEDURE (Dr. Janee Mornhompson) 1.  Right forearm volar decompressive fasciotomy 2. Right distal volar forearm traumatic wound excisional debridement of skin 3. Right distal forearm traumatic wound closure, intermediate, 5 cm  F/u with Dr. Imogene Burnhen in 2-3 weeks.  Thanks

## 2016-08-30 NOTE — Telephone Encounter (Signed)
Sched appt 09/07/16 at 9:30. Spoke to mother.

## 2016-09-01 ENCOUNTER — Encounter (HOSPITAL_BASED_OUTPATIENT_CLINIC_OR_DEPARTMENT_OTHER): Payer: Self-pay | Admitting: Orthopedic Surgery

## 2016-09-05 NOTE — Progress Notes (Signed)
    Postoperative Visit   History of Present Illness   Frank Warner is a 36 y.o. year old male who presents for postoperative follow-up for: R wrist exploration, ligation of R ulnar artery (08/23/16).  The patient also underwent R arm fasciotomy at the same time.  Dr. Janee Mornhompson closed the fasciotomy on 08/30/14.  The patient's current symptoms are: anesthesia distal to transverse incision..   For VQI Use Only   PRE-ADM LIVING: Home  AMB STATUS: Ambulatory   Physical Examination   Vitals:   09/07/16 0941  BP: 118/75  Pulse: 86  Resp: 18  Temp: (!) 97.2 F (36.2 C)  SpO2: 96%  Weight: 238 lb (108 kg)  Height: 6' (1.829 m)    RUE: Transverse incision is healing, fasciotomy incision c/d/i with intact sutures, palpable radial pulse with warm hand, hand grip 4/5, sensation grossly intact except patch anes distal to transverse incision  Medical Decision Making   Frank Warner is a 36 y.o. year old male who presents s/p Ligation of R ulnar artery, decompressive fasciotomy R forearm.  Pt has some residual weakness in his dominant hand. Will refer him to OT for evaluation and treatment. Pt also has follow up with Dr. Janee Mornhompson. Follow up in 3 months for re-evaluation. I discussed with the patient the need to protect the right radial artery, given his ulnar artery was transected by his injury and required ligation.   AVOID any arterial lines or radial artery catheterization in in right arm.  I reiterated the concern for potential right hand ischemia in the event of radial artery occlusion. Thank you for allowing us to participate in this patient's care.   Leonides SakeBrian Tanga Gloor, MD, FACS [facility] Office: [phone] Pager: [phone]

## 2016-09-07 ENCOUNTER — Ambulatory Visit (INDEPENDENT_AMBULATORY_CARE_PROVIDER_SITE_OTHER): Payer: Self-pay | Admitting: Vascular Surgery

## 2016-09-07 ENCOUNTER — Encounter: Payer: Self-pay | Admitting: Vascular Surgery

## 2016-09-07 VITALS — BP 118/75 | HR 86 | Temp 97.2°F | Resp 18 | Ht 72.0 in | Wt 238.0 lb

## 2016-09-07 DIAGNOSIS — S55001A Unspecified injury of ulnar artery at forearm level, right arm, initial encounter: Secondary | ICD-10-CM

## 2016-10-06 ENCOUNTER — Telehealth: Payer: Self-pay | Admitting: Vascular Surgery

## 2016-10-06 NOTE — Telephone Encounter (Signed)
Pt came in 10/05/16 in the afternoon when Epic was down. He thought he had an appt with us that day. He was not on the printed sch and no one remembered talking to him that day. Checked his appt desk 10/06/16 in the morning. Pt did not have an appt on 10/05/16. He is scheduled to see us in 11/2016. Lm on hm# to inform him of this.
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
## 2016-12-09 ENCOUNTER — Ambulatory Visit: Payer: Self-pay | Admitting: Vascular Surgery

## 2017-05-01 ENCOUNTER — Other Ambulatory Visit: Payer: Self-pay

## 2017-05-01 ENCOUNTER — Emergency Department (HOSPITAL_COMMUNITY)
Admission: EM | Admit: 2017-05-01 | Discharge: 2017-05-01 | Disposition: A | Discharge status: Home / Self Care | Payer: Self-pay | Attending: Emergency Medicine | Admitting: Emergency Medicine

## 2017-05-01 ENCOUNTER — Encounter (HOSPITAL_COMMUNITY): Payer: Self-pay

## 2017-05-01 DIAGNOSIS — H9202 Otalgia, left ear: Secondary | ICD-10-CM | POA: Insufficient documentation

## 2017-05-01 DIAGNOSIS — J020 Streptococcal pharyngitis: Secondary | ICD-10-CM | POA: Insufficient documentation

## 2017-05-01 DIAGNOSIS — F17210 Nicotine dependence, cigarettes, uncomplicated: Secondary | ICD-10-CM | POA: Insufficient documentation

## 2017-05-01 LAB — RAPID STREP SCREEN (MED CTR MEBANE ONLY): Streptococcus, Group A Screen (Direct): POSITIVE — AB

## 2017-05-01 MED ORDER — CARBAMIDE PEROXIDE 6.5 % OT SOLN: 5.0000 [drp] | Freq: Two times a day (BID) | OTIC | 0 refills | Status: DC

## 2017-05-01 MED ORDER — PSEUDOEPHEDRINE HCL 30 MG PO TABS: 30.0000 mg | ORAL_TABLET | ORAL | 0 refills | Status: DC | PRN

## 2017-05-01 MED ORDER — DEXAMETHASONE SODIUM PHOSPHATE 10 MG/ML IJ SOLN
10.0000 mg | Freq: Once | INTRAMUSCULAR | Status: AC
  Administered 2017-05-01: 10 mg via INTRAMUSCULAR
  Filled 2017-05-01: qty 1

## 2017-05-01 MED ORDER — PENICILLIN G BENZATHINE 1200000 UNIT/2ML IM SUSP
1.2000 10*6.[IU] | Freq: Once | INTRAMUSCULAR | Status: AC
Start: 2017-05-01 — End: 2017-05-01
  Administered 2017-05-01: 1.2 10*6.[IU] via INTRAMUSCULAR
  Filled 2017-05-01: qty 2

## 2017-05-01 MED ORDER — IBUPROFEN 600 MG PO TABS: 600.0000 mg | ORAL_TABLET | Freq: Four times a day (QID) | ORAL | 0 refills | Status: DC | PRN
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
## 2017-05-01 NOTE — Discharge Instructions (Signed)
You were treated today with antibiotics and steroid medication for your strep infection. Take ibuprofen to help with pain and fever. Take sudafed for congestion. You have ear wax impaction in left ear, use debrox drops to clean out  your ear. Follow up with family doctor or urgent care as needed. Return if worsening.

## 2017-05-01 NOTE — ED Triage Notes (Signed)
Patient complains of left ear pain and sore throat intermittently x 1 month, states that his tonsils feel swollen, NAD

## 2020-07-19 ENCOUNTER — Other Ambulatory Visit: Payer: Self-pay

## 2020-07-19 ENCOUNTER — Ambulatory Visit (INDEPENDENT_AMBULATORY_CARE_PROVIDER_SITE_OTHER): Payer: Self-pay

## 2020-07-19 ENCOUNTER — Ambulatory Visit
Admission: EM | Admit: 2020-07-19 | Discharge: 2020-07-19 | Disposition: A | Discharge status: Home / Self Care | Payer: Self-pay | Attending: Nurse Practitioner | Admitting: Nurse Practitioner

## 2020-07-19 ENCOUNTER — Encounter: Payer: Self-pay | Admitting: *Deleted

## 2020-07-19 DIAGNOSIS — S86002A Unspecified injury of left Achilles tendon, initial encounter: Secondary | ICD-10-CM

## 2020-07-19 DIAGNOSIS — M25572 Pain in left ankle and joints of left foot: Secondary | ICD-10-CM

## 2020-07-19 DIAGNOSIS — M25562 Pain in left knee: Secondary | ICD-10-CM

## 2020-07-19 MED ORDER — IBUPROFEN 600 MG PO TABS: 600.0000 mg | ORAL_TABLET | Freq: Four times a day (QID) | ORAL | 0 refills | Status: DC | PRN

## 2020-07-19 NOTE — Discharge Instructions (Addendum)
Wear boot at all times except for sleeping and showering. Apply ICE to area at least three times a day  Take medications as prescribed to reduce inflammation  Follow-up with orthopedics as soon as possible, call in the morning to make an appointment

## 2020-07-19 NOTE — ED Triage Notes (Signed)
Pt reports playing basketball 2 days ago; states stepped backwards and heard a "pop" to left posterior ankle.  C/O pain with any ambulation, swelling.  LLE CMS intact.
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
## 2021-11-19 ENCOUNTER — Encounter (HOSPITAL_COMMUNITY)
Admission: EM | Disposition: A | Discharge status: Rehabilitation Facility | Payer: Self-pay | Source: Home / Self Care | Attending: Neurology

## 2021-11-19 ENCOUNTER — Emergency Department (HOSPITAL_COMMUNITY): Payer: Self-pay

## 2021-11-19 ENCOUNTER — Inpatient Hospital Stay (HOSPITAL_COMMUNITY)
Admission: EM | Admit: 2021-11-19 | Discharge: 2021-12-02 | DRG: 023 | Disposition: A | Discharge status: Rehabilitation Facility | Payer: Self-pay | Attending: Neurology | Admitting: Neurology

## 2021-11-19 ENCOUNTER — Emergency Department (HOSPITAL_COMMUNITY): Payer: Self-pay | Admitting: Certified Registered Nurse Anesthetist

## 2021-11-19 ENCOUNTER — Inpatient Hospital Stay (HOSPITAL_COMMUNITY): Payer: Self-pay

## 2021-11-19 DIAGNOSIS — R29810 Facial weakness: Secondary | ICD-10-CM | POA: Diagnosis present

## 2021-11-19 DIAGNOSIS — F17210 Nicotine dependence, cigarettes, uncomplicated: Secondary | ICD-10-CM

## 2021-11-19 DIAGNOSIS — I639 Cerebral infarction, unspecified: Principal | ICD-10-CM

## 2021-11-19 DIAGNOSIS — Z597 Insufficient social insurance and welfare support: Secondary | ICD-10-CM

## 2021-11-19 DIAGNOSIS — R4701 Aphasia: Secondary | ICD-10-CM | POA: Diagnosis present

## 2021-11-19 DIAGNOSIS — F1291 Cannabis use, unspecified, in remission: Secondary | ICD-10-CM

## 2021-11-19 DIAGNOSIS — G9349 Other encephalopathy: Secondary | ICD-10-CM | POA: Diagnosis present

## 2021-11-19 DIAGNOSIS — Z87891 Personal history of nicotine dependence: Secondary | ICD-10-CM

## 2021-11-19 DIAGNOSIS — J988 Other specified respiratory disorders: Secondary | ICD-10-CM

## 2021-11-19 DIAGNOSIS — E669 Obesity, unspecified: Secondary | ICD-10-CM | POA: Diagnosis present

## 2021-11-19 DIAGNOSIS — I63511 Cerebral infarction due to unspecified occlusion or stenosis of right middle cerebral artery: Secondary | ICD-10-CM

## 2021-11-19 DIAGNOSIS — E870 Hyperosmolality and hypernatremia: Secondary | ICD-10-CM | POA: Diagnosis present

## 2021-11-19 DIAGNOSIS — Z6831 Body mass index (BMI) 31.0-31.9, adult: Secondary | ICD-10-CM

## 2021-11-19 DIAGNOSIS — Z823 Family history of stroke: Secondary | ICD-10-CM

## 2021-11-19 DIAGNOSIS — F1210 Cannabis abuse, uncomplicated: Secondary | ICD-10-CM | POA: Diagnosis present

## 2021-11-19 DIAGNOSIS — Z91148 Patient's other noncompliance with medication regimen for other reason: Secondary | ICD-10-CM

## 2021-11-19 DIAGNOSIS — J690 Pneumonitis due to inhalation of food and vomit: Secondary | ICD-10-CM | POA: Diagnosis not present

## 2021-11-19 DIAGNOSIS — I6381 Other cerebral infarction due to occlusion or stenosis of small artery: Secondary | ICD-10-CM | POA: Diagnosis not present

## 2021-11-19 DIAGNOSIS — I6990 Unspecified sequelae of unspecified cerebrovascular disease: Secondary | ICD-10-CM

## 2021-11-19 DIAGNOSIS — J9601 Acute respiratory failure with hypoxia: Secondary | ICD-10-CM

## 2021-11-19 DIAGNOSIS — E785 Hyperlipidemia, unspecified: Secondary | ICD-10-CM

## 2021-11-19 DIAGNOSIS — I63231 Cerebral infarction due to unspecified occlusion or stenosis of right carotid arteries: Secondary | ICD-10-CM

## 2021-11-19 DIAGNOSIS — R29720 NIHSS score 20: Secondary | ICD-10-CM | POA: Diagnosis present

## 2021-11-19 DIAGNOSIS — I63522 Cerebral infarction due to unspecified occlusion or stenosis of left anterior cerebral artery: Secondary | ICD-10-CM | POA: Diagnosis present

## 2021-11-19 DIAGNOSIS — G253 Myoclonus: Secondary | ICD-10-CM | POA: Diagnosis present

## 2021-11-19 DIAGNOSIS — Z720 Tobacco use: Secondary | ICD-10-CM

## 2021-11-19 DIAGNOSIS — E876 Hypokalemia: Secondary | ICD-10-CM | POA: Diagnosis not present

## 2021-11-19 DIAGNOSIS — G936 Cerebral edema: Secondary | ICD-10-CM | POA: Diagnosis present

## 2021-11-19 DIAGNOSIS — Z1152 Encounter for screening for COVID-19: Secondary | ICD-10-CM

## 2021-11-19 DIAGNOSIS — I6389 Other cerebral infarction: Secondary | ICD-10-CM

## 2021-11-19 DIAGNOSIS — G935 Compression of brain: Secondary | ICD-10-CM | POA: Diagnosis present

## 2021-11-19 DIAGNOSIS — R569 Unspecified convulsions: Secondary | ICD-10-CM

## 2021-11-19 DIAGNOSIS — G8194 Hemiplegia, unspecified affecting left nondominant side: Secondary | ICD-10-CM | POA: Diagnosis present

## 2021-11-19 DIAGNOSIS — D72829 Elevated white blood cell count, unspecified: Secondary | ICD-10-CM | POA: Diagnosis not present

## 2021-11-19 DIAGNOSIS — F1910 Other psychoactive substance abuse, uncomplicated: Secondary | ICD-10-CM

## 2021-11-19 DIAGNOSIS — I6350 Cerebral infarction due to unspecified occlusion or stenosis of unspecified cerebral artery: Secondary | ICD-10-CM

## 2021-11-19 DIAGNOSIS — I10 Essential (primary) hypertension: Secondary | ICD-10-CM | POA: Diagnosis present

## 2021-11-19 HISTORY — PX: IR US GUIDE VASC ACCESS RIGHT: IMG2390

## 2021-11-19 HISTORY — PX: RADIOLOGY WITH ANESTHESIA: SHX6223

## 2021-11-19 HISTORY — PX: IR CT HEAD LTD: IMG2386

## 2021-11-19 HISTORY — DX: Accidental discharge from unspecified firearms or gun, initial encounter: W34.00XA

## 2021-11-19 HISTORY — PX: IR PERCUTANEOUS ART THROMBECTOMY/INFUSION INTRACRANIAL INC DIAG ANGIO: IMG6087

## 2021-11-19 LAB — POCT I-STAT 7, (LYTES, BLD GAS, ICA,H+H)
Acid-Base Excess: 2 mmol/L (ref 0.0–2.0)
Bicarbonate: 25 mmol/L (ref 20.0–28.0)
Calcium, Ion: 1.28 mmol/L (ref 1.15–1.40)
HCT: 47 % (ref 39.0–52.0)
Hemoglobin: 16 g/dL (ref 13.0–17.0)
O2 Saturation: 100 %
Patient temperature: 98.3
Potassium: 4.1 mmol/L (ref 3.5–5.1)
Sodium: 136 mmol/L (ref 135–145)
TCO2: 26 mmol/L (ref 22–32)
pCO2 arterial: 34.2 mmHg (ref 32–48)
pH, Arterial: 7.471 — ABNORMAL HIGH (ref 7.35–7.45)
pO2, Arterial: 219 mmHg — ABNORMAL HIGH (ref 83–108)

## 2021-11-19 LAB — DIFFERENTIAL
Abs Immature Granulocytes: 0.04 10*3/uL (ref 0.00–0.07)
Basophils Absolute: 0 10*3/uL (ref 0.0–0.1)
Basophils Relative: 0 %
Eosinophils Absolute: 0 10*3/uL (ref 0.0–0.5)
Eosinophils Relative: 0 %
Immature Granulocytes: 1 %
Lymphocytes Relative: 7 %
Lymphs Abs: 0.6 10*3/uL — ABNORMAL LOW (ref 0.7–4.0)
Monocytes Absolute: 0.1 10*3/uL (ref 0.1–1.0)
Monocytes Relative: 2 %
Neutro Abs: 7.1 10*3/uL (ref 1.7–7.7)
Neutrophils Relative %: 90 %

## 2021-11-19 LAB — COMPREHENSIVE METABOLIC PANEL
ALT: 21 U/L (ref 0–44)
AST: 43 U/L — ABNORMAL HIGH (ref 15–41)
Albumin: 3.4 g/dL — ABNORMAL LOW (ref 3.5–5.0)
Alkaline Phosphatase: 70 U/L (ref 38–126)
Anion gap: 9 (ref 5–15)
BUN: 9 mg/dL (ref 6–20)
CO2: 24 mmol/L (ref 22–32)
Calcium: 9.2 mg/dL (ref 8.9–10.3)
Chloride: 100 mmol/L (ref 98–111)
Creatinine, Ser: 1.18 mg/dL (ref 0.61–1.24)
GFR, Estimated: >60 mL/min (ref >60)
Glucose, Bld: 114 mg/dL — ABNORMAL HIGH (ref 70–99)
Potassium: 3.9 mmol/L (ref 3.5–5.1)
Sodium: 133 mmol/L — ABNORMAL LOW (ref 135–145)
Total Bilirubin: 1 mg/dL (ref 0.3–1.2)
Total Protein: 7.4 g/dL (ref 6.5–8.1)

## 2021-11-19 LAB — CBC
HCT: 46.1 % (ref 39.0–52.0)
Hemoglobin: 15.5 g/dL (ref 13.0–17.0)
MCH: 33.8 pg (ref 26.0–34.0)
MCHC: 33.6 g/dL (ref 30.0–36.0)
MCV: 100.4 fL — ABNORMAL HIGH (ref 80.0–100.0)
Platelets: 278 10*3/uL (ref 150–400)
RBC: 4.59 MIL/uL (ref 4.22–5.81)
RDW: 15 % (ref 11.5–15.5)
WBC: 8.8 10*3/uL (ref 4.0–10.5)
nRBC: 0.6 % — ABNORMAL HIGH (ref 0.0–0.2)

## 2021-11-19 LAB — RAPID URINE DRUG SCREEN, HOSP PERFORMED
Amphetamines: NOT DETECTED
Barbiturates: NOT DETECTED
Benzodiazepines: NOT DETECTED
Cocaine: NOT DETECTED
Opiates: NOT DETECTED
Tetrahydrocannabinol: POSITIVE — AB

## 2021-11-19 LAB — I-STAT VENOUS BLOOD GAS, ED
Acid-Base Excess: 2 mmol/L (ref 0.0–2.0)
Bicarbonate: 27.4 mmol/L (ref 20.0–28.0)
Calcium, Ion: 1.2 mmol/L (ref 1.15–1.40)
HCT: 47 % (ref 39.0–52.0)
Hemoglobin: 16 g/dL (ref 13.0–17.0)
O2 Saturation: 82 %
Potassium: 3.9 mmol/L (ref 3.5–5.1)
Sodium: 137 mmol/L (ref 135–145)
TCO2: 29 mmol/L (ref 22–32)
pCO2, Ven: 42.6 mmHg — ABNORMAL LOW (ref 44–60)
pH, Ven: 7.416 (ref 7.25–7.43)
pO2, Ven: 46 mmHg — ABNORMAL HIGH (ref 32–45)

## 2021-11-19 LAB — CBG MONITORING, ED
Glucose-Capillary: 121 mg/dL — ABNORMAL HIGH (ref 70–99)
Glucose-Capillary: 93 mg/dL (ref 70–99)

## 2021-11-19 LAB — APTT: aPTT: 26 seconds (ref 24–36)

## 2021-11-19 LAB — GLUCOSE, CAPILLARY
Glucose-Capillary: 138 mg/dL — ABNORMAL HIGH (ref 70–99)
Glucose-Capillary: 140 mg/dL — ABNORMAL HIGH (ref 70–99)

## 2021-11-19 LAB — PROTIME-INR
INR: 1 (ref 0.8–1.2)
Prothrombin Time: 13.4 seconds (ref 11.4–15.2)

## 2021-11-19 LAB — ETHANOL: Alcohol, Ethyl (B): <10 mg/dL (ref <10)

## 2021-11-19 LAB — RESP PANEL BY RT-PCR (FLU A&B, COVID) ARPGX2
Influenza A by PCR: NEGATIVE
Influenza B by PCR: NEGATIVE
SARS Coronavirus 2 by RT PCR: NEGATIVE

## 2021-11-19 LAB — ECHOCARDIOGRAM COMPLETE
Area-P 1/2: 3.77 cm2
Height: 72 in
S' Lateral: 3.1 cm
Weight: 3703.73 oz

## 2021-11-19 SURGERY — RADIOLOGY WITH ANESTHESIA
Anesthesia: General

## 2021-11-19 MED ORDER — STROKE: EARLY STAGES OF RECOVERY BOOK
Freq: Once | Status: AC
  Filled 2021-11-19: qty 1

## 2021-11-19 MED ORDER — TENECTEPLASE FOR STROKE
25.0000 mg | PACK | Freq: Once | INTRAVENOUS | Status: DC
  Filled 2021-11-19: qty 10

## 2021-11-19 MED ORDER — IOHEXOL 300 MG/ML  SOLN: 100.0000 mL | Freq: Once | INTRAMUSCULAR | Status: DC | PRN

## 2021-11-19 MED ORDER — SODIUM CHLORIDE 0.9 % IV SOLN
250.0000 mL | INTRAVENOUS | Status: DC
  Administered 2021-11-25: 250 mL via INTRAVENOUS

## 2021-11-19 MED ORDER — ORAL CARE MOUTH RINSE: 15.0000 mL | OROMUCOSAL | Status: DC | PRN

## 2021-11-19 MED ORDER — SUCCINYLCHOLINE CHLORIDE 200 MG/10ML IV SOSY
PREFILLED_SYRINGE | INTRAVENOUS | Status: DC | PRN
  Administered 2021-11-19: 120 mg via INTRAVENOUS

## 2021-11-19 MED ORDER — ACETAMINOPHEN 160 MG/5ML PO SOLN: 650.0000 mg | ORAL | Status: DC | PRN

## 2021-11-19 MED ORDER — SODIUM CHLORIDE 0.9 % IV SOLN: INTRAVENOUS | Status: DC

## 2021-11-19 MED ORDER — PHENYLEPHRINE 80 MCG/ML (10ML) SYRINGE FOR IV PUSH (FOR BLOOD PRESSURE SUPPORT)
PREFILLED_SYRINGE | INTRAVENOUS | Status: DC | PRN
  Administered 2021-11-19 (×2): 80 ug via INTRAVENOUS

## 2021-11-19 MED ORDER — ROCURONIUM BROMIDE 10 MG/ML (PF) SYRINGE
PREFILLED_SYRINGE | INTRAVENOUS | Status: DC | PRN
  Administered 2021-11-19: 40 mg via INTRAVENOUS
  Administered 2021-11-19: 30 mg via INTRAVENOUS

## 2021-11-19 MED ORDER — GADOPICLENOL 0.5 MMOL/ML IV SOLN
10.0000 mL | Freq: Once | INTRAVENOUS | Status: AC | PRN
  Administered 2021-11-19: 10 mL via INTRAVENOUS

## 2021-11-19 MED ORDER — ACETAMINOPHEN 325 MG PO TABS
650.0000 mg | ORAL_TABLET | ORAL | Status: DC | PRN
  Administered 2021-11-29 – 2021-12-02 (×5): 650 mg via ORAL
  Filled 2021-11-19 (×5): qty 2

## 2021-11-19 MED ORDER — LIDOCAINE 2% (20 MG/ML) 5 ML SYRINGE
INTRAMUSCULAR | Status: DC | PRN
  Administered 2021-11-19: 100 mg via INTRAVENOUS

## 2021-11-19 MED ORDER — ACETAMINOPHEN 650 MG RE SUPP: 650.0000 mg | RECTAL | Status: DC | PRN

## 2021-11-19 MED ORDER — FENTANYL CITRATE PF 50 MCG/ML IJ SOSY
50.0000 ug | PREFILLED_SYRINGE | INTRAMUSCULAR | Status: DC | PRN
  Administered 2021-11-21 (×2): 100 ug via INTRAVENOUS
  Filled 2021-11-19 (×2): qty 2

## 2021-11-19 MED ORDER — CLEVIDIPINE BUTYRATE 0.5 MG/ML IV EMUL: 0.0000 mg/h | INTRAVENOUS | Status: DC

## 2021-11-19 MED ORDER — IOHEXOL 300 MG/ML  SOLN
100.0000 mL | Freq: Once | INTRAMUSCULAR | Status: AC | PRN
  Administered 2021-11-19: 75 mL via INTRA_ARTERIAL

## 2021-11-19 MED ORDER — PROPOFOL 10 MG/ML IV BOLUS
INTRAVENOUS | Status: DC | PRN
  Administered 2021-11-19: 100 mg via INTRAVENOUS

## 2021-11-19 MED ORDER — LORAZEPAM 2 MG/ML IJ SOLN
0.5000 mg | Freq: Once | INTRAMUSCULAR | Status: AC
Start: 2021-11-19 — End: 2021-11-19
  Administered 2021-11-19: 0.5 mg via INTRAVENOUS
  Filled 2021-11-19: qty 1

## 2021-11-19 MED ORDER — POLYETHYLENE GLYCOL 3350 17 G PO PACK
17.0000 g | PACK | Freq: Every day | ORAL | Status: DC
  Administered 2021-11-21: 17 g
  Filled 2021-11-19: qty 1

## 2021-11-19 MED ORDER — PANTOPRAZOLE SODIUM 40 MG IV SOLR
40.0000 mg | Freq: Every day | INTRAVENOUS | Status: DC
  Administered 2021-11-19 – 2021-11-21 (×3): 40 mg via INTRAVENOUS
  Filled 2021-11-19 (×3): qty 10

## 2021-11-19 MED ORDER — DEXAMETHASONE SODIUM PHOSPHATE 10 MG/ML IJ SOLN
INTRAMUSCULAR | Status: DC | PRN
  Administered 2021-11-19: 4 mg via INTRAVENOUS

## 2021-11-19 MED ORDER — ORAL CARE MOUTH RINSE
15.0000 mL | OROMUCOSAL | Status: DC
  Administered 2021-11-19 – 2021-11-24 (×48): 15 mL via OROMUCOSAL

## 2021-11-19 MED ORDER — LACTATED RINGERS IV SOLN: INTRAVENOUS | Status: DC | PRN

## 2021-11-19 MED ORDER — LEVETIRACETAM IN NACL 1000 MG/100ML IV SOLN
1000.0000 mg | Freq: Two times a day (BID) | INTRAVENOUS | Status: DC
  Administered 2021-11-20: 1000 mg via INTRAVENOUS
  Filled 2021-11-19: qty 100

## 2021-11-19 MED ORDER — PROPOFOL 1000 MG/100ML IV EMUL
5.0000 ug/kg/min | INTRAVENOUS | Status: DC
  Administered 2021-11-19: 80 ug/kg/min via INTRAVENOUS
  Administered 2021-11-19: 70 ug/kg/min via INTRAVENOUS
  Administered 2021-11-20 (×3): 80 ug/kg/min via INTRAVENOUS
  Administered 2021-11-20: 60 ug/kg/min via INTRAVENOUS
  Administered 2021-11-20 (×3): 70 ug/kg/min via INTRAVENOUS
  Administered 2021-11-20: 65 ug/kg/min via INTRAVENOUS
  Administered 2021-11-20: 60 ug/kg/min via INTRAVENOUS
  Administered 2021-11-21: 65 ug/kg/min via INTRAVENOUS
  Administered 2021-11-21: 60 ug/kg/min via INTRAVENOUS
  Administered 2021-11-21: 50 ug/kg/min via INTRAVENOUS
  Administered 2021-11-21: 60 ug/kg/min via INTRAVENOUS
  Administered 2021-11-21: 50 ug/kg/min via INTRAVENOUS
  Administered 2021-11-21: 60 ug/kg/min via INTRAVENOUS
  Administered 2021-11-21: 65 ug/kg/min via INTRAVENOUS
  Administered 2021-11-21: 80 ug/kg/min via INTRAVENOUS
  Administered 2021-11-21: 60 ug/kg/min via INTRAVENOUS
  Administered 2021-11-21: 50 ug/kg/min via INTRAVENOUS
  Administered 2021-11-22: 65 ug/kg/min via INTRAVENOUS
  Administered 2021-11-22: 70 ug/kg/min via INTRAVENOUS
  Administered 2021-11-22: 65 ug/kg/min via INTRAVENOUS
  Filled 2021-11-19 (×20): qty 100
  Filled 2021-11-19: qty 200
  Filled 2021-11-19: qty 100

## 2021-11-19 MED ORDER — ACETAMINOPHEN 325 MG PO TABS: 650.0000 mg | ORAL_TABLET | Freq: Once | ORAL | Status: DC

## 2021-11-19 MED ORDER — LABETALOL HCL 5 MG/ML IV SOLN
INTRAVENOUS | Status: DC | PRN
  Administered 2021-11-19 (×2): 10 mg via INTRAVENOUS

## 2021-11-19 MED ORDER — LORAZEPAM 2 MG/ML IJ SOLN
INTRAMUSCULAR | Status: AC
  Administered 2021-11-19: 2 mg
  Filled 2021-11-19: qty 1

## 2021-11-19 MED ORDER — ESMOLOL HCL 100 MG/10ML IV SOLN
INTRAVENOUS | Status: DC | PRN
  Administered 2021-11-19: 50 mg via INTRAVENOUS

## 2021-11-19 MED ORDER — SENNOSIDES-DOCUSATE SODIUM 8.6-50 MG PO TABS: 1.0000 | ORAL_TABLET | Freq: Every evening | ORAL | Status: DC | PRN

## 2021-11-19 MED ORDER — PHENYLEPHRINE HCL-NACL 20-0.9 MG/250ML-% IV SOLN
INTRAVENOUS | Status: DC | PRN
  Administered 2021-11-19: 25 ug/min via INTRAVENOUS

## 2021-11-19 MED ORDER — LACTATED RINGERS IV BOLUS
500.0000 mL | Freq: Once | INTRAVENOUS | Status: AC
  Administered 2021-11-19: 500 mL via INTRAVENOUS

## 2021-11-19 MED ORDER — NOREPINEPHRINE 4 MG/250ML-% IV SOLN: 0.0000 ug/min | INTRAVENOUS | Status: DC

## 2021-11-19 MED ORDER — DOCUSATE SODIUM 50 MG/5ML PO LIQD
100.0000 mg | Freq: Two times a day (BID) | ORAL | Status: DC
  Administered 2021-11-19 – 2021-11-20 (×2): 100 mg
  Filled 2021-11-19 (×2): qty 10

## 2021-11-19 MED ORDER — MIDAZOLAM HCL 2 MG/2ML IJ SOLN
2.0000 mg | INTRAMUSCULAR | Status: DC | PRN
  Administered 2021-11-19 – 2021-11-22 (×8): 2 mg via INTRAVENOUS
  Filled 2021-11-19 (×9): qty 2

## 2021-11-19 MED ORDER — SODIUM CHLORIDE 0.9 % IV SOLN
4500.0000 mg | Freq: Once | INTRAVENOUS | Status: AC
  Administered 2021-11-19: 4500 mg via INTRAVENOUS
  Filled 2021-11-19: qty 45

## 2021-11-19 MED ORDER — CLEVIDIPINE BUTYRATE 0.5 MG/ML IV EMUL
INTRAVENOUS | Status: AC
  Filled 2021-11-19: qty 50

## 2021-11-19 MED ORDER — PROPOFOL 1000 MG/100ML IV EMUL
INTRAVENOUS | Status: AC
  Administered 2021-11-19: 80 ug/kg/min via INTRAVENOUS
  Filled 2021-11-19: qty 100

## 2021-11-19 MED ORDER — NOREPINEPHRINE 4 MG/250ML-% IV SOLN
2.0000 ug/min | INTRAVENOUS | Status: DC
  Administered 2021-11-20: 2 ug/min via INTRAVENOUS
  Administered 2021-11-21: 5 ug/min via INTRAVENOUS
  Filled 2021-11-19 (×2): qty 250

## 2021-11-19 MED ORDER — ONDANSETRON HCL 4 MG/2ML IJ SOLN
INTRAMUSCULAR | Status: DC | PRN
  Administered 2021-11-19: 4 mg via INTRAVENOUS

## 2021-11-19 NOTE — ED Notes (Signed)
Patient transported to IR 

## 2021-11-19 NOTE — Progress Notes (Addendum)
eLink Physician-Brief Progress Note Patient Name: Frank Warner DOB: [birth year] MRN: [id number]   Date of Service  11/19/2021  HPI/Events of Note  Notified of BP not at goal.   41/M with Rt ICA-MCA occlusion and taken for mechanical thrombectomy. SBP goal 120-140.  Pt is intubated and sedated on propofol.    eICU Interventions  Give 500cc LR bolus now.   Add levophed through US guided peripheral IV for BP control if BP not at goal after fluid bolus.      Intervention Category Intermediate Interventions: Hypotension - evaluation and management  Elsie Lincoln 11/19/2021, 9:13 PM

## 2021-11-19 NOTE — ED Notes (Signed)
Patient transported to X-ray 

## 2021-11-19 NOTE — ED Triage Notes (Signed)
PT BIB [facility] for seizure at home with no known hx of same.  Family heard a noise and found pt on floor next to couch rigid, arms and legs shaking.  They believe he fell of the couch. Pt Is postictal but complaining of HA on arrival. Oriented to self, year, president.    138/92 99% 54 CBG 103.

## 2021-11-19 NOTE — Anesthesia Postprocedure Evaluation (Signed)
Anesthesia Post Note  Patient: Frank Warner  Procedure(s) Performed: RADIOLOGY WITH ANESTHESIA     Patient location during evaluation: PACU Anesthesia Type: General Level of consciousness: sedated Pain management: pain level controlled Vital Signs Assessment: post-procedure vital signs reviewed and stable Respiratory status: patient remains intubated per anesthesia plan Cardiovascular status: stable Postop Assessment: no apparent nausea or vomiting Anesthetic complications: no   No notable events documented.  Last Vitals:  Vitals:   11/19/21 1730 11/19/21 1810  BP: 126/89 116/83  Pulse: 74 72  Resp: 19 18  Temp:    SpO2: 100% 100%    Last Pain:  Vitals:   11/19/21 1102  TempSrc: Oral                 Santa Lighter

## 2021-11-19 NOTE — Progress Notes (Signed)
An USGPIV (ultrasound guided PIV) has been placed for short-term vasopressor infusion. A correctly placed ivWatch must be used when administering Vasopressors. Should this treatment be needed beyond 72 hours, central line access should be obtained.  It will be the responsibility of the bedside nurse to follow best practice to prevent extravasations.   ?
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
## 2021-11-19 NOTE — Progress Notes (Signed)
LTM EEG hooked up and running - no initial skin breakdown - push button tested - neuro notified. [facility] monitoring.  

## 2021-11-19 NOTE — Procedures (Signed)
INTERVENTIONAL NEURORADIOLOGY BRIEF POSTPROCEDURE NOTE  DIAGNOSTIC CEREBRAL ANGIOGRAM AND MECHANICAL THROMBECTOMY  Attending: Dr. Pedro Earls  Diagnosis: Right ICA-MCA occlusion.  Access site: RCFA  Access closure: Perclose prostyle  Anesthesia: GETA  Medication used: Refer to anesthesia documentation.  Complications: Small SAH.  Estimated blood loss: 100 mL  Specimen: None.  Findings: Occlusion of the intracranial right ICA at the terminus. Mechanical thrombectomy performed with direct contact aspiration (x2) and combined stent retriever and aspiration (x2) resulting in complete recanalization (TICI 2C). Post procedural flat panel CT showed contrast staining of the basal ganglia likely related to ongoing  ischemia and small SAH vs contrast extravazation.   PLAN: - bed rest x 6 hours - Pt remained intubated due to concern for airway protection  - SBP 120-140 mmHg
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
## 2021-11-19 NOTE — Transfer of Care (Signed)
Immediate Anesthesia Transfer of Care Note  Patient: Frank Warner  Procedure(s) Performed: RADIOLOGY WITH ANESTHESIA  Patient Location: ICU  Anesthesia Type:General  Level of Consciousness: Patient remains intubated per anesthesia plan  Airway & Oxygen Therapy: Patient remains intubated per anesthesia plan and Patient placed on Ventilator (see vital sign flow sheet for setting)  Post-op Assessment: Report given to RN and Post -op Vital signs reviewed and stable  Post vital signs: Reviewed and stable, NIBP 143/90, HR 78, Pox 100%  Last Vitals:  Vitals Value Taken Time  BP    Temp    Pulse    Resp    SpO2      Last Pain:  Vitals:   11/19/21 1102  TempSrc: Oral         Complications: No notable events documented.

## 2021-11-19 NOTE — Progress Notes (Signed)
  Echocardiogram 2D Echocardiogram has been performed.  Frank Warner 11/19/2021, 4:49 PM

## 2021-11-19 NOTE — ED Notes (Signed)
Pt has gaze to right but will track to the left

## 2021-11-19 NOTE — ED Provider Notes (Signed)
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 (has no administration in time range)  fentaNYL (SUBLIMAZE) injection 50-200 mcg (has no administration in time range)  propofol (DIPRIVAN) 1000 MG/100ML infusion (80 mcg/kg/min  105 kg Intravenous New Bag/Given 11/19/21 1801)  midazolam (VERSED) injection 2 mg (has no administration in  time range)  levETIRAcetam (KEPPRA) 4,500 mg in sodium chloride 0.9 % 250 mL IVPB (4,500 mg Intravenous New Bag/Given 11/19/21 1806)    Followed by  levETIRAcetam (KEPPRA) IVPB 1000 mg/100 mL premix (has no administration in time range)  LORazepam (ATIVAN) injection 0.5 mg (0.5 mg Intravenous Given 11/19/21 1124)  iohexol (OMNIPAQUE) 300 MG/ML solution 100 mL (75 mLs Intra-arterial Contrast Given 11/19/21 1445)  gadopiclenol (VUEWAY) 0.5 MMOL/ML solution 10 mL (10 mLs Intravenous Contrast Given 11/19/21 1513)  LORazepam (ATIVAN) 2 MG/ML injection (2 mg  Given 11/19/21 1647)    ED Course/ Medical Decision Making/ A&P                           Medical Decision Making Amount and/or Complexity of Data Reviewed Labs: ordered. Radiology: ordered.  Risk OTC drugs. Prescription drug management. Decision regarding  hospitalization.   41 year old male presents to ED for evaluation.  Please see HPI for further details.  On my examination of the patient at 11:10AM patient is lethargic however alert and oriented x4, the patient will arouse to stimulus and can tell us his name, what year it is and who the president is, how many quarters make a dollar. There is no slurring of words. The initial patient neurological examination did not show any focal neurodeficits.  The patient was able to track my finger appropriately across all visual fields.  Initially the patient had intact strength and sensation upper and lower extremities bilaterally.  Patient had no appearance of tongue laceration, urination on himself.  Patient again showed some seizure like activity with rhythmic shaking so 0.5 mg ativan was administered during initial examination. Patient was thought to have had a seizure so seizure work-up was initiated. This included CT head, chest x-ray due to cough to rule out aspiration, CBC, i-STAT venous blood gas, CMP, ethanol, rapid urine drug screen, CBG, EKG. These orders were placed at 11:23AM.  The resulting CBC of the patient was unremarkable.  The patient CMP showed a slightly decreased sodium to 133.  Patient i-STAT venous blood gas showed a decreased PCO2 42.6.  The patient chest x-ray was unremarkable.  The patient CT head did not show any intracranial abnormality or evidence of CVA, this was read and interpreted by radiology at 12:02PM.  28 minutes passed between time of CT read and my viewing the interpretation due to other departmental/patient responsibilities. Upon reading patient CT head interpretation at 12:30PM and radiology read indicating no intracranial abnormality, I went to reevaluate the patient. This was at 12:35PM  At this time, the patient had decreased strength and sensation in his upper extremity on the left, patient will withdraw his arm to pain however.  The patient had decreased left lower  extremity strength 3/5 however was able to hold his leg in the air for 5 seconds. There was consideration given to the fact that these findings could be related to Todd's paralysis. Patient continued to track finger across visual fields however patient now seemingly has gaze favoring to right. I alerted my attending Dr. Elpidio Anis about the patient's change in status and she reported to the patient bedside. We both examined the patient and determined that a code stroke should be called at this time. Code stroke was activated at 12:45PM.  After code stroke was called, Gevena Mart, NP with neurology presented to patient bedside.  NP Artis Flock along with myself and Dr. Elpidio Anis determined that this patient needed to be brought to the  MRI machine. I had already placed MR orders for brain at 12:39 as I anticipated that this patient would need further imaging due to original CT head read not showing any evidence of CVA.  At this time Dr. Roda Shutters presented to patient bedside and agreed to proceed with MR imaging. As stated above I called MRI and advised them of the situation and need for emergent imaging, they advised that this patient needed a full body xray in order to rule out any kind of implantable devices that could affect his imaging. I ordered a stat abdominal x-ray at 12:43 to rule out any kind of spinal cord stimulator and then 2 minutes later activated code stroke after conferring with attending Dr. Elpidio Anis. After no implantable device noted on wet read of film, patient was then taken to MRI with myself, Gemma Payor, NP and Dr. Roda Shutters.  Upon arriving back to the patient's room, the patient's family presented.  Patient family states that the patient returned home last night and had slight slurring of words however the patient had been drinking and so they are unsure if that was the cause. The patient family states that this morning around 9:30 AM was when they found him on the ground shaking with suspected seizure-like  activity. Patient mother states only medical history patient has is hypertension however she states he is not compliant with medication.  After talking with family, I received a call from Dr. Roda Shutters.  Dr. Roda Shutters stated that the patient's initial CT head actually did show evidence of possible hyperdense right MCA which was not appreciated on the initial CT interpretation.  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  The patient was taken for thrombectomy at this time and transferred to neurological service for further management.  Final Clinical Impression(s) / ED Diagnoses Final diagnoses:  None    Rx / DC Orders ED Discharge Orders     None         Clent Ridges 11/19/21 2205    Mardene Sayer, MD 11/20/21 [phone]

## 2021-11-19 NOTE — ED Notes (Signed)
Pt taken to MRI with RN and neurologist

## 2021-11-19 NOTE — Sedation Documentation (Signed)
Transported patient with anesthesia, nurse, and IR tech. Assessed right groin with Timmothy Sours RN no hematoma dressing intact.
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
## 2021-11-19 NOTE — Anesthesia Preprocedure Evaluation (Addendum)
Anesthesia Evaluation  Patient identified by MRN, date of birth, ID band Patient unresponsive  General Assessment Comment:CODE STROKE  Reviewed: Allergy & Precautions, NPO status , Patient's Chart, lab work & pertinent test results, Unable to perform ROS - Chart review onlyPreop documentation limited or incomplete due to emergent nature of procedure.  Airway Mallampati: III  TM Distance: >3 FB Neck ROM: Full    Dental  (+) Teeth Intact, Dental Advisory Given   Pulmonary Patient abstained from smoking.,    Pulmonary exam normal breath sounds clear to auscultation       Cardiovascular  Rhythm:Regular Rate:Tachycardia     Neuro/Psych  Headaches, CVA, Residual Symptoms    GI/Hepatic   Endo/Other    Renal/GU      Musculoskeletal   Abdominal   Peds  Hematology   Anesthesia Other Findings Day of surgery medications reviewed with the patient.  Reproductive/Obstetrics                             Anesthesia Physical Anesthesia Plan  ASA: 4 and emergent  Anesthesia Plan: General   Post-op Pain Management: Minimal or no pain anticipated   Induction: Intravenous  PONV Risk Score and Plan: 2 and Treatment may vary due to age or medical condition  Airway Management Planned: Oral ETT  Additional Equipment:   Intra-op Plan:   Post-operative Plan: Post-operative intubation/ventilation  Informed Consent:     Only emergency history available and History available from chart only  Plan Discussed with: CRNA  Anesthesia Plan Comments: (Pre-op evaluation completed after induction of anesthesia due to emergent nature of the procedure.)       Anesthesia Quick Evaluation

## 2021-11-19 NOTE — ED Notes (Signed)
Patient transported to MRI 

## 2021-11-19 NOTE — Anesthesia Procedure Notes (Signed)
Procedure Name: Intubation Date/Time: 11/19/2021 2:08 PM  Performed by: Inda Coke, CRNAPre-anesthesia Checklist: Patient identified, Emergency Drugs available, Suction available, Timeout performed and Patient being monitored Patient Re-evaluated:Patient Re-evaluated prior to induction Oxygen Delivery Method: Circle system utilized Preoxygenation: Pre-oxygenation with 100% oxygen Induction Type: IV induction and Rapid sequence Ventilation: Mask ventilation without difficulty Laryngoscope Size: Mac and 4 Grade View: Grade I Tube type: Oral Tube size: 7.5 mm Number of attempts: 1 Airway Equipment and Method: Stylet Placement Confirmation: ETT inserted through vocal cords under direct vision, positive ETCO2, CO2 detector and breath sounds checked- equal and bilateral Secured at: 23 cm Tube secured with: Tape Dental Injury: Teeth and Oropharynx as per pre-operative assessment

## 2021-11-20 ENCOUNTER — Inpatient Hospital Stay (HOSPITAL_BASED_OUTPATIENT_CLINIC_OR_DEPARTMENT_OTHER): Payer: Self-pay

## 2021-11-20 ENCOUNTER — Inpatient Hospital Stay (HOSPITAL_COMMUNITY): Payer: Self-pay

## 2021-11-20 ENCOUNTER — Other Ambulatory Visit (HOSPITAL_COMMUNITY): Payer: Self-pay

## 2021-11-20 ENCOUNTER — Encounter (HOSPITAL_COMMUNITY): Payer: Self-pay | Admitting: Radiology

## 2021-11-20 DIAGNOSIS — I639 Cerebral infarction, unspecified: Secondary | ICD-10-CM

## 2021-11-20 DIAGNOSIS — I6359 Cerebral infarction due to unspecified occlusion or stenosis of other cerebral artery: Secondary | ICD-10-CM

## 2021-11-20 DIAGNOSIS — I63512 Cerebral infarction due to unspecified occlusion or stenosis of left middle cerebral artery: Secondary | ICD-10-CM

## 2021-11-20 LAB — PHOSPHORUS
Phosphorus: 4 mg/dL (ref 2.5–4.6)
Phosphorus: 5.1 mg/dL — ABNORMAL HIGH (ref 2.5–4.6)
Phosphorus: 4.6 mg/dL (ref 2.5–4.6)

## 2021-11-20 LAB — CBC
HCT: 46.9 % (ref 39.0–52.0)
Hemoglobin: 16.2 g/dL (ref 13.0–17.0)
MCH: 34.7 pg — ABNORMAL HIGH (ref 26.0–34.0)
MCHC: 34.5 g/dL (ref 30.0–36.0)
MCV: 100.4 fL — ABNORMAL HIGH (ref 80.0–100.0)
Platelets: 223 10*3/uL (ref 150–400)
RBC: 4.67 MIL/uL (ref 4.22–5.81)
RDW: 15.5 % (ref 11.5–15.5)
WBC: 15.2 10*3/uL — ABNORMAL HIGH (ref 4.0–10.5)
nRBC: 0.3 % — ABNORMAL HIGH (ref 0.0–0.2)

## 2021-11-20 LAB — LIPID PANEL
Cholesterol: 244 mg/dL — ABNORMAL HIGH (ref 0–200)
HDL: 62 mg/dL (ref >40)
LDL Cholesterol: 162 mg/dL — ABNORMAL HIGH (ref 0–99)
Total CHOL/HDL Ratio: 3.9 RATIO
Triglycerides: 102 mg/dL (ref <150)
VLDL: 20 mg/dL (ref 0–40)

## 2021-11-20 LAB — BASIC METABOLIC PANEL
Anion gap: 13 (ref 5–15)
BUN: 8 mg/dL (ref 6–20)
CO2: 23 mmol/L (ref 22–32)
Calcium: 9.6 mg/dL (ref 8.9–10.3)
Chloride: 106 mmol/L (ref 98–111)
Creatinine, Ser: 1.21 mg/dL (ref 0.61–1.24)
GFR, Estimated: >60 mL/min (ref >60)
Glucose, Bld: 106 mg/dL — ABNORMAL HIGH (ref 70–99)
Potassium: 4.8 mmol/L (ref 3.5–5.1)
Sodium: 142 mmol/L (ref 135–145)

## 2021-11-20 LAB — SEDIMENTATION RATE: Sed Rate: 3 mm/hr (ref 0–16)

## 2021-11-20 LAB — HEMOGLOBIN A1C
Hgb A1c MFr Bld: 5.6 % (ref 4.8–5.6)
Mean Plasma Glucose: 114.02 mg/dL

## 2021-11-20 LAB — MRSA NEXT GEN BY PCR, NASAL: MRSA by PCR Next Gen: NOT DETECTED

## 2021-11-20 LAB — GLUCOSE, CAPILLARY
Glucose-Capillary: 106 mg/dL — ABNORMAL HIGH (ref 70–99)
Glucose-Capillary: 114 mg/dL — ABNORMAL HIGH (ref 70–99)
Glucose-Capillary: 100 mg/dL — ABNORMAL HIGH (ref 70–99)
Glucose-Capillary: 122 mg/dL — ABNORMAL HIGH (ref 70–99)
Glucose-Capillary: 121 mg/dL — ABNORMAL HIGH (ref 70–99)

## 2021-11-20 LAB — MAGNESIUM
Magnesium: 1.9 mg/dL (ref 1.7–2.4)
Magnesium: 1.9 mg/dL (ref 1.7–2.4)
Magnesium: 1.8 mg/dL (ref 1.7–2.4)

## 2021-11-20 LAB — ANTITHROMBIN III: AntiThromb III Func: 106 % (ref 75–120)

## 2021-11-20 LAB — HIV ANTIBODY (ROUTINE TESTING W REFLEX): HIV Screen 4th Generation wRfx: NONREACTIVE

## 2021-11-20 LAB — C-REACTIVE PROTEIN: CRP: 1 mg/dL — ABNORMAL HIGH (ref <1.0)

## 2021-11-20 MED ORDER — VITAL HIGH PROTEIN PO LIQD
1000.0000 mL | ORAL | Status: DC
  Administered 2021-11-20 – 2021-11-21 (×2): 1000 mL

## 2021-11-20 MED ORDER — SENNOSIDES-DOCUSATE SODIUM 8.6-50 MG PO TABS: 1.0000 | ORAL_TABLET | Freq: Every evening | ORAL | Status: DC | PRN

## 2021-11-20 MED ORDER — CLEVIDIPINE BUTYRATE 0.5 MG/ML IV EMUL: 0.0000 mg/h | INTRAVENOUS | Status: DC

## 2021-11-20 MED ORDER — LEVETIRACETAM IN NACL 1500 MG/100ML IV SOLN
1500.0000 mg | Freq: Two times a day (BID) | INTRAVENOUS | Status: DC
  Administered 2021-11-20 – 2021-11-27 (×14): 1500 mg via INTRAVENOUS
  Filled 2021-11-20 (×17): qty 100

## 2021-11-20 MED ORDER — PROSOURCE TF20 ENFIT COMPATIBL EN LIQD
60.0000 mL | Freq: Every day | ENTERAL | Status: DC
  Administered 2021-11-20 – 2021-11-21 (×2): 60 mL
  Filled 2021-11-20 (×2): qty 60

## 2021-11-20 MED ORDER — DOCUSATE SODIUM 50 MG/5ML PO LIQD
100.0000 mg | Freq: Two times a day (BID) | ORAL | Status: DC
  Administered 2021-11-20 – 2021-11-22 (×4): 100 mg
  Filled 2021-11-20 (×5): qty 10

## 2021-11-20 MED ORDER — ONDANSETRON HCL 4 MG/2ML IJ SOLN: 4.0000 mg | Freq: Four times a day (QID) | INTRAMUSCULAR | Status: DC | PRN

## 2021-11-20 MED ORDER — ACETAMINOPHEN 325 MG PO TABS: 650.0000 mg | ORAL_TABLET | ORAL | Status: DC | PRN

## 2021-11-20 MED ORDER — ACETAMINOPHEN 160 MG/5ML PO SOLN: 650.0000 mg | ORAL | Status: DC | PRN

## 2021-11-20 MED ORDER — DOCUSATE SODIUM 100 MG PO CAPS: 100.0000 mg | ORAL_CAPSULE | Freq: Two times a day (BID) | ORAL | Status: DC

## 2021-11-20 MED ORDER — ACETAMINOPHEN 650 MG RE SUPP: 650.0000 mg | RECTAL | Status: DC | PRN

## 2021-11-20 MED ORDER — CHLORHEXIDINE GLUCONATE CLOTH 2 % EX PADS
6.0000 | MEDICATED_PAD | Freq: Every day | CUTANEOUS | Status: DC
  Administered 2021-11-21 – 2021-12-02 (×12): 6 via TOPICAL
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
## 2021-11-20 NOTE — Progress Notes (Signed)
OT Cancellation Note  Patient Details Name: Frank Warner MRN: [id number] DOB: Jul 26, 1980   Cancelled Treatment:    Reason Eval/Treat Not Completed: Medical issues which prohibited therapy;Active bedrest order. OT will continue to follow for medical appropriateness.  [place] 11/20/2021, 9:05 AM  [place] Office: [phone]

## 2021-11-20 NOTE — Progress Notes (Addendum)
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 Head- pending  Post procedure MRI after LTM  Carotid Doppler  Bil ICA 1-39% stenosis 2D Echo EF 55-60% VAS TCD Bubble- negative for PFO Hypercoaguable workup pending LDL 162 HgbA1c 5.6 VTE prophylaxis - SCDs    Diet   Diet NPO time specified   No antithrombotic prior to admission, now on No antithrombotic. 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 Mechanical thrombectomy performed with direct contact aspiration (x2) and combined stent  retriever and aspiration (x2) resulting in complete recanalization (TICI 2C).   Acute Hypoxic respiratory failure Ventilator management per CCM Minimize sedation with increase in AEDs today   Hypertension Home meds:  None Stable Permissive hypertension (OK if < 220/120) but gradually normalize in 5-7 days Long-term BP goal normotensive  Hyperlipidemia Home meds:  None LDL 162, goal < 70 Add Crestor   High intensity statin not indicated  Continue statin at discharge  Other Stroke Risk Factors Cigarette smoker-advised to stop smoking ETOH use, alcohol level <10, advised to drink  no more than 2 drink(s) a day Substance abuse - UDS:  THC POSITIVE, Cocaine NONE DETECTED. Patient advised to stop using due to stroke risk. Obesity, Body mass index is 31.39 kg/m., BMI >/= 30 associated with increased stroke risk, recommend weight loss, diet and exercise as appropriate  Family hx stroke (possibly grandfather)  Other Active Problems Seizure activity cEEG- 11/19/2021 1727 to 11/20/2021 0730- This study is suggestive of cortical dysfunction arising from right temporo-parietal region likely secondary to underlying stroke. Additionally there is severe diffuse encephalopathy, nonspecific etiology but likely related to sedation. No seizures or epileptiform discharges were seen throughout the recording. Leukocytosis WBC 15.2 Tmax 99.0  Hospital day # 1  Patient seen and examined by NP/APP with MD. MD to update note as needed.   Elmer Picker, DNP, FNP-BC [facility] Pager: [phone]  STROKE MD NOTE : I have personally obtained history,examined this patient, reviewed notes, independently viewed imaging studies, participated in medical decision making and plan of care.ROS completed by me personally and pertinent positives fully documented  I have made any additions or clarifications directly to the above note. Agree with note above.  Patient remains sedated and intubated on high-dose propofol as he had some seizures when the dose was lowered.  Recommend increase Keppra dose to 1.5 g twice daily and continue long-term monitoring overnight.  If no further seizures are noted we will discontinue long-term EEG tomorrow.  We will check follow-up brain imaging after EEG leads are removed.  Long discussion at the bedside with the patient's girlfriend as well as sister and answered questions.  We will also check bedside TCD bubble study for PFO. This patient is critically ill and at significant risk of neurological worsening, death and care requires constant monitoring of vital  signs, hemodynamics,respiratory and cardiac monitoring, extensive review of multiple databases, frequent neurological assessment, discussion with family, other specialists and medical decision making of high complexity.I have made any additions or clarifications directly to the above note.This critical care time does not reflect procedure time, or teaching time or supervisory time of PA/NP/Med Resident etc but could involve care discussion time.  I spent 30 minutes of neurocritical care time  in the care of  this patient.     Delia Heady, MD Medical Director [facility] Pager: [phone] 11/20/2021 5:53 PM   To contact Stroke Continuity provider, please refer to [email or web address]. After hours, contact General Neurology

## 2021-11-20 NOTE — Progress Notes (Signed)
Contacted neuro in regards to ordered 0600 MRI and informed them that the patient is on continuous EEG with non-compatible leads. Was told to hold off on MRI for now. Will pass on info to day shift.

## 2021-11-20 NOTE — Progress Notes (Signed)
PT Cancellation Note  Patient Details Name: Laythan Hayter MRN: [id number] DOB: Jul 09, 1980   Cancelled Treatment:    Reason Eval/Treat Not Completed: Active bedrest order; patient on vent, continuous EEG and with current bedrest orders.  Will attempt another day.   Reginia Naas 11/20/2021, 9:01 AM Magda Kiel, PT Acute Rehabilitation Services Office:[phone] 11/20/2021

## 2021-11-20 NOTE — Progress Notes (Signed)
LTM maint complete - no skin breakdown . 

## 2021-11-20 NOTE — Progress Notes (Signed)
LTM maint complete - no skin breakdown Serviced several leads. [facility] monitored, Event button test confirmed by [facility].

## 2021-11-20 NOTE — Progress Notes (Signed)
Carotid artery duplex and bilateral lower extremity venous duplex has been completed. Preliminary results can be found in CV Proc through chart review.   11/20/21 11:33 AM Frank Warner RVT

## 2021-11-20 NOTE — Procedures (Signed)
Patient Name: Frank Warner  MRN: [id number]  Epilepsy Attending: Lora Havens  Referring Physician/Provider: Rosalin Hawking, MD  Duration: 11/19/2021 1727 to 11/20/2021 1727  Patient history: 41yo  M with 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 EEG to evaluate for seizure  Level of alertness: comatose  AEDs during EEG study: LEV, propofol  Technical aspects: This EEG study was done with scalp electrodes positioned according to the 10-20 International system of electrode placement. Electrical activity was reviewed with band pass filter of 1-70Hz , sensitivity of 7 uV/mm, display speed of 35mm/sec with a 60Hz  notched filter applied as appropriate. EEG data were recorded continuously and digitally stored.  Video monitoring was available and reviewed as appropriate.  Description: EEG showed continuous generalized 3 to 6 Hz theta-delta slowing admixed with 15-18Hz  beta activity. The slowing was lower amplitude in right temporo-parietal region. Hyperventilation and photic stimulation were not performed.     ABNORMALITY - Continuous slow, generalized and maximal right temporo-parietal region.  IMPRESSION: This study is suggestive of cortical dysfunction arising from right temporo-parietal region likely secondary to underlying stroke. Additionally there is severe diffuse encephalopathy, nonspecific etiology but likely related to sedation. No seizures or epileptiform discharges were seen throughout the recording.  Ridwan Bondy Barbra Sarks

## 2021-11-20 NOTE — Progress Notes (Signed)
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 unremarkable. IMPRESSION: Low lung volumes. No active disease. No evidence of pneumonia or pulmonary edema. Electronically Signed   By: Franki Cabot M.D.   On: 11/19/2021 11:48    Assessment/Plan: CVA S/p (R)ICA terminus thrombectomy. MRI once EEG completed. NIR following    LOS: 1 day   I spent a total of 20 minutes in face to face in clinical consultation, greater than 50% of which was counseling/coordinating care for CVA post intervention  Ascencion Dike PA-C 11/20/2021 11:33 AM

## 2021-11-20 NOTE — Progress Notes (Signed)
VASCULAR LAB    TCD bubble study has been performed.  See CV proc for preliminary results.   Lyon Dumont, RVT 11/20/2021, 3:27 PM

## 2021-11-21 ENCOUNTER — Inpatient Hospital Stay: Payer: Self-pay

## 2021-11-21 ENCOUNTER — Inpatient Hospital Stay (HOSPITAL_COMMUNITY): Payer: Self-pay

## 2021-11-21 DIAGNOSIS — I63511 Cerebral infarction due to unspecified occlusion or stenosis of right middle cerebral artery: Secondary | ICD-10-CM

## 2021-11-21 DIAGNOSIS — R4182 Altered mental status, unspecified: Secondary | ICD-10-CM

## 2021-11-21 DIAGNOSIS — J9601 Acute respiratory failure with hypoxia: Secondary | ICD-10-CM

## 2021-11-21 LAB — CBC
HCT: 42.4 % (ref 39.0–52.0)
Hemoglobin: 14.2 g/dL (ref 13.0–17.0)
MCH: 34.1 pg — ABNORMAL HIGH (ref 26.0–34.0)
MCHC: 33.5 g/dL (ref 30.0–36.0)
MCV: 101.9 fL — ABNORMAL HIGH (ref 80.0–100.0)
Platelets: 195 10*3/uL (ref 150–400)
RBC: 4.16 MIL/uL — ABNORMAL LOW (ref 4.22–5.81)
RDW: 15.9 % — ABNORMAL HIGH (ref 11.5–15.5)
WBC: 20.1 10*3/uL — ABNORMAL HIGH (ref 4.0–10.5)
nRBC: 0.4 % — ABNORMAL HIGH (ref 0.0–0.2)

## 2021-11-21 LAB — PHOSPHORUS
Phosphorus: 2.9 mg/dL (ref 2.5–4.6)
Phosphorus: 3.1 mg/dL (ref 2.5–4.6)

## 2021-11-21 LAB — GLUCOSE, CAPILLARY
Glucose-Capillary: 120 mg/dL — ABNORMAL HIGH (ref 70–99)
Glucose-Capillary: 129 mg/dL — ABNORMAL HIGH (ref 70–99)
Glucose-Capillary: 112 mg/dL — ABNORMAL HIGH (ref 70–99)
Glucose-Capillary: 103 mg/dL — ABNORMAL HIGH (ref 70–99)
Glucose-Capillary: 104 mg/dL — ABNORMAL HIGH (ref 70–99)
Glucose-Capillary: 95 mg/dL (ref 70–99)

## 2021-11-21 LAB — SODIUM
Sodium: 141 mmol/L (ref 135–145)
Sodium: 145 mmol/L (ref 135–145)

## 2021-11-21 LAB — BASIC METABOLIC PANEL
Anion gap: 7 (ref 5–15)
BUN: 12 mg/dL (ref 6–20)
CO2: 25 mmol/L (ref 22–32)
Calcium: 8.5 mg/dL — ABNORMAL LOW (ref 8.9–10.3)
Chloride: 107 mmol/L (ref 98–111)
Creatinine, Ser: 1.19 mg/dL (ref 0.61–1.24)
GFR, Estimated: >60 mL/min (ref >60)
Glucose, Bld: 124 mg/dL — ABNORMAL HIGH (ref 70–99)
Potassium: 3.5 mmol/L (ref 3.5–5.1)
Sodium: 139 mmol/L (ref 135–145)

## 2021-11-21 LAB — MAGNESIUM
Magnesium: 1.7 mg/dL (ref 1.7–2.4)
Magnesium: 1.9 mg/dL (ref 1.7–2.4)

## 2021-11-21 LAB — BETA-2-GLYCOPROTEIN I ABS, IGG/M/A
Beta-2 Glyco I IgG: <9 GPI IgG units (ref 0–20)
Beta-2-Glycoprotein I IgA: <9 GPI IgA units (ref 0–25)
Beta-2-Glycoprotein I IgM: <9 GPI IgM units (ref 0–32)

## 2021-11-21 MED ORDER — SODIUM CHLORIDE 3 % IV SOLN
INTRAVENOUS | Status: DC
  Filled 2021-11-21 (×13): qty 500

## 2021-11-21 MED ORDER — SODIUM CHLORIDE 0.9% FLUSH: 10.0000 mL | INTRAVENOUS | Status: DC | PRN

## 2021-11-21 MED ORDER — PIPERACILLIN-TAZOBACTAM 3.375 G IVPB
3.3750 g | Freq: Three times a day (TID) | INTRAVENOUS | Status: DC
  Administered 2021-11-21 – 2021-11-23 (×6): 3.375 g via INTRAVENOUS
  Filled 2021-11-21 (×6): qty 50

## 2021-11-21 MED ORDER — SODIUM CHLORIDE 0.9% FLUSH
10.0000 mL | Freq: Two times a day (BID) | INTRAVENOUS | Status: DC
  Administered 2021-11-21 – 2021-11-24 (×5): 10 mL
  Administered 2021-11-25: 20 mL
  Administered 2021-11-25 – 2021-12-02 (×13): 10 mL

## 2021-11-21 MED ORDER — PIPERACILLIN-TAZOBACTAM 3.375 G IVPB 30 MIN
3.3750 g | Freq: Four times a day (QID) | INTRAVENOUS | Status: DC
  Filled 2021-11-21: qty 50

## 2021-11-21 NOTE — Progress Notes (Signed)
PT Cancellation Note  Patient Details Name: Zuhair Lariccia MRN: [id number] DOB: 1980/07/11   Cancelled Treatment:    Reason Eval/Treat Not Completed: Patient not medically ready (on vent, active bedrest order)  Wyona Almas, PT, DPT [facility] Office [place] 11/21/2021, 8:11 AM

## 2021-11-21 NOTE — Progress Notes (Signed)
SLP Cancellation Note  Patient Details Name: March Steyer MRN: [id number] DOB: Dec 11, 1980   Cancelled treatment:       Reason Eval/Treat Not Completed: Patient not medically ready (Pt currently on the vent. SLP will follow up on subsequent date.)  Keoshia Steinmetz I. Hardin Negus, [place], [place] Office number [phone]  Horton Marshall 11/21/2021, 8:17 AM

## 2021-11-21 NOTE — Procedures (Addendum)
Patient Name: Frank Warner  MRN: [id number]  Epilepsy Attending: Lora Havens  Referring Physician/Provider: Rosalin Hawking, MD  Duration: 11/20/2021 1727 to 11/21/2021 1043   Patient history: 41yo  M with 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 EEG to evaluate for seizure   Level of alertness: comatose   AEDs during EEG study: LEV, propofol   Technical aspects: This EEG study was done with scalp electrodes positioned according to the 10-20 International system of electrode placement. Electrical activity was reviewed with band pass filter of 1-70Hz , sensitivity of 7 uV/mm, display speed of 6mm/sec with a 60Hz  notched filter applied as appropriate. EEG data were recorded continuously and digitally stored.  Video monitoring was available and reviewed as appropriate.   Description: EEG showed continuous generalized 3 to 6 Hz theta-delta slowing admixed with 15-18Hz  beta activity. The slowing was lower amplitude in right temporo-parietal region. Hyperventilation and photic stimulation were not performed.      ABNORMALITY - Continuous slow, generalized and maximal right temporo-parietal region.   IMPRESSION: This study is suggestive of cortical dysfunction arising from right temporo-parietal region likely secondary to underlying stroke. Additionally there is severe diffuse encephalopathy, nonspecific etiology but likely related to sedation. No seizures or epileptiform discharges were seen throughout the recording.   Rockey Guarino Barbra Sarks
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
## 2021-11-21 NOTE — Progress Notes (Signed)
Pt transported on vent from 36m08 to CT and back without any complications. RN at bedside, RT will monitor.

## 2021-11-21 NOTE — Progress Notes (Signed)
LTM EEG discontinued - no skin breakdown at unhook.   

## 2021-11-21 NOTE — Progress Notes (Signed)
Initial Nutrition Assessment RD working remotely.  DOCUMENTATION CODES:   Not applicable  INTERVENTION:  - continue Vital High Protein @ 40 ml/hr with 60 ml Prosource TF20 once/day.  - monitor for need to adjust TF regimen with changes in propofol provision.  - complete NFPE when feasible.    NUTRITION DIAGNOSIS:   Inadequate oral intake related to inability to eat as evidenced by NPO status.  GOAL:   Provide needs based on ASPEN/SCCM guidelines  MONITOR:   Vent status, TF tolerance, Labs, Weight trends  REASON FOR ASSESSMENT:   Ventilator, Consult Enteral/tube feeding initiation and management  ASSESSMENT:   41 year-old male with medical history of cluster headaches and tobacco use. His family thought they heard a noise and when they went to investigate, they found him on rigid on the floor with L-sided weakness, slurred speech, and gaze preference. He was admitted with dx of R-sided ICA-MCA occlusion and was taken for mechanical thrombectomy.  Patient was intubated on 9/22 afternoon. Patient has OGT in place since that time (looped in gastric fundus per CXR on 9/22; no other imaging including tube since that time).   Able to communicate with RN via secure chat. She shares that patient is tolerating Vital High Protein @ 40 ml/hr with 60 ml Prosource TF20 once/day without issue. This regimen is providing 1040 kcal, 104 grams protein, and 802 ml free water.   Weight today is 233 lb, weight on 9/22 was 231 lb, and PTA the most recently documented weight was 238 lb on 09/07/16. Generalized edema noted in the edema section of flow sheet.   Patient is currently intubated on ventilator support MV: 9.4 L/min Temp (24hrs), Avg:99.6 F (37.6 C), Min:99 F (37.2 C), Max:101.3 F (38.5 C) Propofol: 31.5 ml/hr (832 kcal/24 hrs)    Labs reviewed; CBGs: 120, 129, 112 mg/dl, Ca: 8.5 mg/dl. Medications reviewed; 100 mg colace BID, 40 mg IV protonix/day, 17 g miralax/day. IVF; NS  @ 50 ml/hr.    NUTRITION - FOCUSED PHYSICAL EXAM:  RD working remotely.  Diet Order:   Diet Order             Diet NPO time specified  Diet effective now                   EDUCATION NEEDS:   No education needs have been identified at this time  Skin:  Skin Assessment: Reviewed RN Assessment  Last BM:  PTA/unknown  Height:   Ht Readings from Last 1 Encounters:  11/19/21 6' (1.829 m)    Weight:   Wt Readings from Last 1 Encounters:  11/21/21 105.6 kg     BMI:  Body mass index is 31.57 kg/m.  Estimated Nutritional Needs:  Kcal:  1830 kcal Protein:  106-127 grams Fluid:  >/= 2 L/day      Jarome Matin, MS, RD, LDN, CNSC Clinical Dietitian PRN/Relief staff On-call/weekend pager # available in [facility]

## 2021-11-21 NOTE — Progress Notes (Addendum)
LTM maint complete - no skin breakdown [facility] monitored, Event button test confirmed by [facility]. ? ?

## 2021-11-21 NOTE — Progress Notes (Signed)
Peripherally Inserted Central Catheter Placement  The IV Nurse has discussed with the patient and/or persons authorized to consent for the patient, the purpose of this procedure and the potential benefits and risks involved with this procedure.  The benefits include less needle sticks, lab draws from the catheter, and the patient may be discharged home with the catheter. Risks include, but not limited to, infection, bleeding, blood clot (thrombus formation), and puncture of an artery; nerve damage and irregular heartbeat and possibility to perform a PICC exchange if needed/ordered by physician.  Alternatives to this procedure were also discussed.  Bard Power PICC patient education guide, fact sheet on infection prevention and patient information card has been provided to patient /or left at bedside.  Telephone consent obtained from mother, Lavella Lemons.  PICC Placement Documentation  PICC Triple Lumen 70/78/67 Right Basilic 44 cm 1 cm (Active)  Indication for Insertion or Continuance of Line Vasoactive infusions 11/21/21 1552  Exposed Catheter (cm) 1 cm 11/21/21 1552  Site Assessment Clean, Dry, Intact 11/21/21 1552  Lumen #1 Status Flushed;Saline locked;Blood return noted 11/21/21 1552  Lumen #2 Status Flushed;Saline locked;Blood return noted 11/21/21 1552  Lumen #3 Status Flushed;Saline locked;Blood return noted 11/21/21 1552  Dressing Type Transparent;Securing device 11/21/21 1552  Dressing Status Antimicrobial disc in place;Clean, Dry, Intact 11/21/21 1552  Safety Lock Not Applicable 54/49/20 1007  Line Care Connections checked and tightened 11/21/21 1552  Line Adjustment (NICU/IV Team Only) No 11/21/21 1552  Dressing Intervention New dressing 11/21/21 1552  Dressing Change Due 11/28/21 11/21/21 1552       Rolena Infante 11/21/2021, 3:53 PM

## 2021-11-22 ENCOUNTER — Inpatient Hospital Stay (HOSPITAL_COMMUNITY): Payer: Self-pay

## 2021-11-22 DIAGNOSIS — I63311 Cerebral infarction due to thrombosis of right middle cerebral artery: Secondary | ICD-10-CM

## 2021-11-22 DIAGNOSIS — I639 Cerebral infarction, unspecified: Secondary | ICD-10-CM

## 2021-11-22 DIAGNOSIS — J9601 Acute respiratory failure with hypoxia: Secondary | ICD-10-CM

## 2021-11-22 LAB — CBC WITH DIFFERENTIAL/PLATELET
Abs Immature Granulocytes: 0.13 10*3/uL — ABNORMAL HIGH (ref 0.00–0.07)
Basophils Absolute: 0 10*3/uL (ref 0.0–0.1)
Basophils Relative: 0 %
Eosinophils Absolute: 0.2 10*3/uL (ref 0.0–0.5)
Eosinophils Relative: 1 %
HCT: 39.4 % (ref 39.0–52.0)
Hemoglobin: 12.9 g/dL — ABNORMAL LOW (ref 13.0–17.0)
Immature Granulocytes: 1 %
Lymphocytes Relative: 17 %
Lymphs Abs: 2.9 10*3/uL (ref 0.7–4.0)
MCH: 34.3 pg — ABNORMAL HIGH (ref 26.0–34.0)
MCHC: 32.7 g/dL (ref 30.0–36.0)
MCV: 104.8 fL — ABNORMAL HIGH (ref 80.0–100.0)
Monocytes Absolute: 1.1 10*3/uL — ABNORMAL HIGH (ref 0.1–1.0)
Monocytes Relative: 6 %
Neutro Abs: 13 10*3/uL — ABNORMAL HIGH (ref 1.7–7.7)
Neutrophils Relative %: 75 %
Platelets: 154 10*3/uL (ref 150–400)
RBC: 3.76 MIL/uL — ABNORMAL LOW (ref 4.22–5.81)
RDW: 16 % — ABNORMAL HIGH (ref 11.5–15.5)
WBC: 17.3 10*3/uL — ABNORMAL HIGH (ref 4.0–10.5)
nRBC: 0.7 % — ABNORMAL HIGH (ref 0.0–0.2)

## 2021-11-22 LAB — BASIC METABOLIC PANEL
Anion gap: 4 — ABNORMAL LOW (ref 5–15)
BUN: 10 mg/dL (ref 6–20)
CO2: 24 mmol/L (ref 22–32)
Calcium: 8.3 mg/dL — ABNORMAL LOW (ref 8.9–10.3)
Chloride: 116 mmol/L — ABNORMAL HIGH (ref 98–111)
Creatinine, Ser: 0.96 mg/dL (ref 0.61–1.24)
GFR, Estimated: >60 mL/min (ref >60)
Glucose, Bld: 130 mg/dL — ABNORMAL HIGH (ref 70–99)
Potassium: 3.2 mmol/L — ABNORMAL LOW (ref 3.5–5.1)
Sodium: 144 mmol/L (ref 135–145)

## 2021-11-22 LAB — GLUCOSE, CAPILLARY
Glucose-Capillary: 137 mg/dL — ABNORMAL HIGH (ref 70–99)
Glucose-Capillary: 117 mg/dL — ABNORMAL HIGH (ref 70–99)
Glucose-Capillary: 100 mg/dL — ABNORMAL HIGH (ref 70–99)
Glucose-Capillary: 100 mg/dL — ABNORMAL HIGH (ref 70–99)
Glucose-Capillary: 96 mg/dL (ref 70–99)
Glucose-Capillary: 98 mg/dL (ref 70–99)

## 2021-11-22 LAB — SODIUM
Sodium: 147 mmol/L — ABNORMAL HIGH (ref 135–145)
Sodium: 152 mmol/L — ABNORMAL HIGH (ref 135–145)

## 2021-11-22 LAB — PROTEIN S, TOTAL: Protein S Ag, Total: 101 % (ref 60–150)

## 2021-11-22 LAB — LUPUS ANTICOAGULANT PANEL
DRVVT: 42.7 s (ref 0.0–47.0)
PTT Lupus Anticoagulant: 30.8 s (ref 0.0–43.5)

## 2021-11-22 LAB — PROTEIN S ACTIVITY: Protein S Activity: 129 % (ref 63–140)

## 2021-11-22 LAB — HOMOCYSTEINE: Homocysteine: 93.5 umol/L — ABNORMAL HIGH (ref 0.0–14.5)

## 2021-11-22 LAB — PROTEIN C ACTIVITY: Protein C Activity: 131 % (ref 73–180)

## 2021-11-22 MED ORDER — MIDAZOLAM HCL 2 MG/2ML IJ SOLN
2.0000 mg | Freq: Once | INTRAMUSCULAR | Status: AC | PRN
  Administered 2021-11-22: 2 mg via INTRAVENOUS
  Filled 2021-11-22: qty 2

## 2021-11-22 MED ORDER — FUROSEMIDE 10 MG/ML IJ SOLN
40.0000 mg | Freq: Once | INTRAMUSCULAR | Status: AC
  Administered 2021-11-22: 40 mg via INTRAVENOUS
  Filled 2021-11-22: qty 4

## 2021-11-22 MED ORDER — POLYETHYLENE GLYCOL 3350 17 G PO PACK
17.0000 g | PACK | Freq: Two times a day (BID) | ORAL | Status: DC
  Administered 2021-11-22: 17 g
  Filled 2021-11-22 (×2): qty 1

## 2021-11-22 MED ORDER — PANTOPRAZOLE 2 MG/ML SUSPENSION
40.0000 mg | Freq: Every day | ORAL | Status: DC
  Filled 2021-11-22: qty 20

## 2021-11-22 MED ORDER — POTASSIUM CHLORIDE 20 MEQ PO PACK
20.0000 meq | PACK | ORAL | Status: AC
  Administered 2021-11-22 (×2): 20 meq
  Filled 2021-11-22 (×2): qty 1

## 2021-11-22 MED ORDER — POTASSIUM CHLORIDE 10 MEQ/50ML IV SOLN
10.0000 meq | INTRAVENOUS | Status: AC
  Administered 2021-11-22 (×4): 10 meq via INTRAVENOUS
  Filled 2021-11-22 (×4): qty 50

## 2021-11-22 MED ORDER — PANTOPRAZOLE SODIUM 40 MG IV SOLR
40.0000 mg | Freq: Every day | INTRAVENOUS | Status: DC
  Administered 2021-11-22 – 2021-11-26 (×5): 40 mg via INTRAVENOUS
  Filled 2021-11-22 (×5): qty 10

## 2021-11-22 NOTE — Progress Notes (Signed)
Pt transported from 3M08 to MRI with RN and rapid response. No complications during transport. RT will continue to monitor.

## 2021-11-22 NOTE — TOC Progression Note (Signed)
Transition of Care [facility]) - Initial/Assessment Note    Patient Details  Name: Frank Warner MRN: [id number] Date of Birth: September 18, 1980  Transition of Care [facility]) CM/SW Contact:    Milinda Antis, [place] Phone Number: 11/22/2021, 11:03 AM  Clinical Narrative:                  Transition of Care Department [facility]) has reviewed patient.  Patient is from home and is currently intubated.  We will continue to monitor patient advancement through interdisciplinary progression rounds.   If new patient transition needs arise, please place a TOC consult.          Patient Goals and CMS Choice        Expected Discharge Plan and Services                                                Prior Living Arrangements/Services                       Activities of Daily Living      Permission Sought/Granted                  Emotional Assessment              Admission diagnosis:  Stroke (cerebrum) [facility]) [I63.9] Patient Active Problem List   Diagnosis Date Noted   Ulnar artery injury, right, initial encounter 08/23/2016   Cellulitis and abscess of face 07/30/2014   Cellulitis of face 07/29/2014   Odontogenic infection of jaw 07/29/2014   Dental caries 07/29/2014   PCP:  Patient, No Pcp Per Pharmacy:   [facility], [address] [place] [address] Phone: [phone] Fax: [phone]  [facility] [address] [place], [address] [place] [address] Phone: [phone] Fax: [phone]     Social Determinants of Health (SDOH) Interventions    Readmission Risk Interventions     No data to display

## 2021-11-22 NOTE — Progress Notes (Addendum)
eLink Physician-Brief Progress Note Patient Name: Frank Warner DOB: June 04, 1980 MRN: [id number]   Date of Service  11/22/2021  HPI/Events of Note  Pt is going to have MRI done.  He had some jerking movements about 1 hour prior and was asynchronous with the vent and was given versed 2mg  IV with good response.   41/M with right ICA occlusion s/p mechanical thrombectomy with new onset seizures.  He remains intubated.   eICU Interventions  Ok to have versed 2mg  IV once PRN.      Intervention Category Intermediate Interventions: Other:  Elsie Lincoln 11/22/2021, 4:18 AM
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
## 2021-11-22 NOTE — Progress Notes (Signed)
[facility] ADULT ICU REPLACEMENT PROTOCOL   The patient does apply for the [facility] Adult ICU Electrolyte Replacment Protocol based on the criteria listed below:   1.Exclusion criteria: TCTS patients, ECMO patients, and Dialysis patients 2. Is GFR >/= 30 ml/min? Yes.    Patient's GFR today is >60 3. Is SCr </= 2? Yes.   Patient's SCr is 0.96 mg/dL 4. Did SCr increase >/= 0.5 in 24 hours? No. 5.Pt's weight >40kg  Yes.   6. Abnormal electrolyte(s): K  7. Electrolytes replaced per protocol 8.  Call MD STAT for K+ </= 2.5, Phos </= 1, or Mag </= 1 Physician:  Sherlene Shams [facility] 11/22/2021 5:03 AM
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
## 2021-11-22 NOTE — Procedures (Signed)
Extubation Procedure Note  Patient Details:   Name: Frank Warner DOB: [birth year] MRN: [id number]   Airway Documentation:    Vent end date: 11/22/21 Vent end time: 1200   Evaluation  O2 sats: stable throughout Complications: No apparent complications Patient did tolerate procedure well. Bilateral Breath Sounds: Rhonchi, Diminished   Yes muttering  Positive cuff leak noted. Pt placed on [place] 4L with humidity, no stridor noted.  Patient slow to follow commands. IS not done at this time.    Mingo Amber Marthella Osorno 11/22/2021, 12:09 PM

## 2021-11-22 NOTE — Progress Notes (Signed)
PT Cancellation Note  Patient Details Name: Frank Warner MRN: [id number] DOB: [birth year]   Cancelled Treatment:    Reason Eval/Treat Not Completed: Active bedrest order (Pt intubated and sedated with active bedrest order.  Will evaluate when status improves and activity level is updated.)   Frank Warner 11/22/2021, 8:46 AM Frank Warner,PT Acute Rehab Services [phone]

## 2021-11-22 NOTE — Progress Notes (Signed)
OT Cancellation Note  Patient Details Name: Frank Warner MRN: [id number] DOB: April 12, 1980   Cancelled Treatment:    Reason Eval/Treat Not Completed: Patient not medically ready (Pt sedated/intubated with active bedrest orders. Will assess when medically appropriate. thanks)  [facility] 11/22/2021, 8:04 AM Maurie Boettcher, OT/L   Acute OT Clinical Specialist Acute Rehabilitation Services Pager [phone] Office [phone]

## 2021-11-22 NOTE — Progress Notes (Signed)
Pharmacy ICU Bowel Regimen Consult Note   Current Inpatient Medications for Bowel Management:  Docusate 100mg  BID  Miralax 17g once daily   Assessment: Frank Warner is a 41 y.o. year old male admitted on 11/19/2021. Bowel regimen assessment completed by Louis on 9/25. LBM on PTA on 9/22.  Plan: Increase Miralax to twice daily.   Thank you for allowing pharmacy to participate in this patient's care.  Ventura Sellers 11/22/2021,9:14 AM

## 2021-11-22 NOTE — TOC Progression Note (Addendum)
Transition of Care [facility]) - Progression Note    Patient Details  Name: Frank Warner MRN: [id number] Date of Birth: [birth year]  [facility]) CM/SW Contact  Tom-Johnson, Renea Ee, RN Phone Number: 11/22/2021, 12:43 PM  Clinical Narrative:     Patient extubated today. On 4L O2.  CM spoke with patient's significant, Robbyn other at bedside.  Patient lives with his mother, has four children and four siblings. Currently employed at [employer].  Does not have a PCP and Medical Insurance. Uses [facility] on [address]. Hosp f/u will be scheduled when patient is medically ready as he progresses towards discharge.  CM will continue to follow with care        Expected Discharge Plan and Services                                                 Social Determinants of Health (SDOH) Interventions    Readmission Risk Interventions     No data to display

## 2021-11-23 ENCOUNTER — Inpatient Hospital Stay (HOSPITAL_COMMUNITY): Payer: Self-pay

## 2021-11-23 DIAGNOSIS — E870 Hyperosmolality and hypernatremia: Secondary | ICD-10-CM

## 2021-11-23 DIAGNOSIS — E876 Hypokalemia: Secondary | ICD-10-CM

## 2021-11-23 LAB — BASIC METABOLIC PANEL
Anion gap: 5 (ref 5–15)
BUN: 11 mg/dL (ref 6–20)
CO2: 26 mmol/L (ref 22–32)
Calcium: 8.4 mg/dL — ABNORMAL LOW (ref 8.9–10.3)
Chloride: 122 mmol/L — ABNORMAL HIGH (ref 98–111)
Creatinine, Ser: 0.99 mg/dL (ref 0.61–1.24)
GFR, Estimated: >60 mL/min (ref >60)
Glucose, Bld: 104 mg/dL — ABNORMAL HIGH (ref 70–99)
Potassium: 3.4 mmol/L — ABNORMAL LOW (ref 3.5–5.1)
Sodium: 153 mmol/L — ABNORMAL HIGH (ref 135–145)

## 2021-11-23 LAB — GLUCOSE, CAPILLARY
Glucose-Capillary: 95 mg/dL (ref 70–99)
Glucose-Capillary: 92 mg/dL (ref 70–99)
Glucose-Capillary: 88 mg/dL (ref 70–99)
Glucose-Capillary: 81 mg/dL (ref 70–99)
Glucose-Capillary: 77 mg/dL (ref 70–99)
Glucose-Capillary: 87 mg/dL (ref 70–99)

## 2021-11-23 LAB — CARDIOLIPIN ANTIBODIES, IGG, IGM, IGA
Anticardiolipin IgA: 15 APL U/mL — ABNORMAL HIGH (ref 0–11)
Anticardiolipin IgG: 9 GPL U/mL (ref 0–14)
Anticardiolipin IgM: <9 MPL U/mL (ref 0–12)

## 2021-11-23 LAB — CBC
HCT: 40.7 % (ref 39.0–52.0)
Hemoglobin: 13.4 g/dL (ref 13.0–17.0)
MCH: 34 pg (ref 26.0–34.0)
MCHC: 32.9 g/dL (ref 30.0–36.0)
MCV: 103.3 fL — ABNORMAL HIGH (ref 80.0–100.0)
Platelets: 161 10*3/uL (ref 150–400)
RBC: 3.94 MIL/uL — ABNORMAL LOW (ref 4.22–5.81)
RDW: 16 % — ABNORMAL HIGH (ref 11.5–15.5)
WBC: 18.1 10*3/uL — ABNORMAL HIGH (ref 4.0–10.5)
nRBC: 0.7 % — ABNORMAL HIGH (ref 0.0–0.2)

## 2021-11-23 LAB — CULTURE, RESPIRATORY W GRAM STAIN: Culture: NORMAL

## 2021-11-23 LAB — MAGNESIUM: Magnesium: 2 mg/dL (ref 1.7–2.4)

## 2021-11-23 LAB — PHOSPHORUS: Phosphorus: 2.8 mg/dL (ref 2.5–4.6)

## 2021-11-23 LAB — SODIUM
Sodium: 154 mmol/L — ABNORMAL HIGH (ref 135–145)
Sodium: 154 mmol/L — ABNORMAL HIGH (ref 135–145)
Sodium: 155 mmol/L — ABNORMAL HIGH (ref 135–145)

## 2021-11-23 MED ORDER — SODIUM CHLORIDE 0.9 % IV SOLN
3.0000 g | Freq: Four times a day (QID) | INTRAVENOUS | Status: AC
  Administered 2021-11-23 – 2021-11-25 (×9): 3 g via INTRAVENOUS
  Filled 2021-11-23 (×9): qty 8

## 2021-11-23 MED ORDER — LORAZEPAM 2 MG/ML IJ SOLN
INTRAMUSCULAR | Status: AC
  Filled 2021-11-23: qty 1

## 2021-11-23 MED ORDER — BISACODYL 10 MG RE SUPP
10.0000 mg | Freq: Once | RECTAL | Status: AC
  Administered 2021-11-23: 10 mg via RECTAL
  Filled 2021-11-23: qty 1

## 2021-11-23 MED ORDER — LORAZEPAM 2 MG/ML IJ SOLN
1.0000 mg | INTRAMUSCULAR | Status: AC
  Administered 2021-11-23: 1 mg via INTRAVENOUS

## 2021-11-23 MED ORDER — POTASSIUM CHLORIDE 10 MEQ/100ML IV SOLN
10.0000 meq | INTRAVENOUS | Status: AC
  Administered 2021-11-23 (×4): 10 meq via INTRAVENOUS
  Filled 2021-11-23: qty 100
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
## 2021-11-23 NOTE — Progress Notes (Signed)
Pharmacy Electrolyte Replacement  Recent Labs:  Recent Labs    11/23/21 0510  K 3.4*  MG 2.0  PHOS 2.8  CREATININE 0.99    Low Critical Values (K </= 2.5, Phos </= 1, Mg </= 1) Present: None  Plan: Replace with 75mEq IV Kcl, patient has no enternal access and currently NPO.
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
## 2021-11-23 NOTE — Progress Notes (Signed)
Pharmacy ICU Bowel Regimen Consult Note   Current Inpatient Medications for Bowel Management:  Docusate 100mg  BID  Miralax 17g once daily   Assessment: Frank Warner is a 41 y.o. year old male admitted on 11/19/2021. Bowel regimen assessment completed by [facility] on 9/26. LBM on PTA on 9/22. Per nursing patient is unable to reliably take PO meds. Speech saw patient and recommended NPO including medications.   Plan: Will give 1x bisacodyl suppository.   Thank you for allowing pharmacy to participate in this patient's care.  Ventura Sellers 11/23/2021,11:37 AM

## 2021-11-23 NOTE — Progress Notes (Signed)
Called regarding worsening agitation.   On exam, he has significantly more hemiparesis than was previously documented, 2-3/5 in the LUE and 4-/5 in the LLE. He has significant neglect and right gaze preference but is able to cross midline.   He does drink regularly, stating he drinks "most days" and will not quantify, but states it is "a lot." I wonder if some etoh withdrawal could be playing some role in agitation and will give 1mg  IV ativan as well as repeating stat head CT.   Roland Rack, MD [facility] [phone]  If 7pm- 7am, please page neurology on call as listed in [place].

## 2021-11-23 NOTE — Progress Notes (Signed)
Inpatient Rehab Admissions Coordinator:  ? ?Per therapy recommendations,  patient was screened for CIR candidacy by Edon Hoadley, MS, CCC-SLP. At this time, Pt. Appears to be a a potential candidate for CIR. I will place   order for rehab consult per protocol for full assessment. Please contact me any with questions. ? ?Zakkery Dorian, MS, CCC-SLP ?Rehab Admissions Coordinator  ?[phone] (celll) ?[phone] (office) ? ?

## 2021-11-23 NOTE — Progress Notes (Signed)
  Inpatient Rehabilitation Admissions Coordinator   Met with patient  at bedside  and spoke with his Mom and girlfriend by phone for rehab assessment. We discussed goals and expectations of a possible CIR admit. They prefer CIR for rehab.  Patient lived with his Mom prior to admit and her plans are for him to d/c home with her. Mom will provide me with his Bosses's number so that I can call to inquire into if he has medical insurance.I feel he is a great candidate for CIR admit, but need clarification of payor. We do admit patients who are uninsured, but patient will get a bill while pursuing disability and Medicaid. Please call me with any questions.   Danne Baxter, RN, MSN Rehab Admissions Coordinator [phone]
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
## 2021-11-24 ENCOUNTER — Other Ambulatory Visit: Payer: Self-pay

## 2021-11-24 LAB — BASIC METABOLIC PANEL
Anion gap: 8 (ref 5–15)
BUN: 12 mg/dL (ref 6–20)
CO2: 22 mmol/L (ref 22–32)
Calcium: 9 mg/dL (ref 8.9–10.3)
Chloride: 124 mmol/L — ABNORMAL HIGH (ref 98–111)
Creatinine, Ser: 0.9 mg/dL (ref 0.61–1.24)
GFR, Estimated: >60 mL/min (ref >60)
Glucose, Bld: 97 mg/dL (ref 70–99)
Potassium: 3 mmol/L — ABNORMAL LOW (ref 3.5–5.1)
Sodium: 154 mmol/L — ABNORMAL HIGH (ref 135–145)

## 2021-11-24 LAB — GLUCOSE, CAPILLARY
Glucose-Capillary: 103 mg/dL — ABNORMAL HIGH (ref 70–99)
Glucose-Capillary: 117 mg/dL — ABNORMAL HIGH (ref 70–99)
Glucose-Capillary: 62 mg/dL — ABNORMAL LOW (ref 70–99)
Glucose-Capillary: 65 mg/dL — ABNORMAL LOW (ref 70–99)
Glucose-Capillary: 71 mg/dL (ref 70–99)
Glucose-Capillary: 73 mg/dL (ref 70–99)
Glucose-Capillary: 89 mg/dL (ref 70–99)

## 2021-11-24 LAB — CBC
HCT: 40.4 % (ref 39.0–52.0)
Hemoglobin: 13.5 g/dL (ref 13.0–17.0)
MCH: 34.1 pg — ABNORMAL HIGH (ref 26.0–34.0)
MCHC: 33.4 g/dL (ref 30.0–36.0)
MCV: 102 fL — ABNORMAL HIGH (ref 80.0–100.0)
Platelets: 193 10*3/uL (ref 150–400)
RBC: 3.96 MIL/uL — ABNORMAL LOW (ref 4.22–5.81)
RDW: 15.7 % — ABNORMAL HIGH (ref 11.5–15.5)
WBC: 13.8 10*3/uL — ABNORMAL HIGH (ref 4.0–10.5)
nRBC: 0.9 % — ABNORMAL HIGH (ref 0.0–0.2)

## 2021-11-24 LAB — SODIUM
Sodium: 151 mmol/L — ABNORMAL HIGH (ref 135–145)
Sodium: 157 mmol/L — ABNORMAL HIGH (ref 135–145)
Sodium: 152 mmol/L — ABNORMAL HIGH (ref 135–145)

## 2021-11-24 LAB — PROTEIN C, TOTAL: Protein C, Total: 126 % (ref 60–150)

## 2021-11-24 MED ORDER — THIAMINE HCL 100 MG/ML IJ SOLN
500.0000 mg | Freq: Three times a day (TID) | INTRAVENOUS | Status: AC
  Administered 2021-11-24 – 2021-11-25 (×6): 500 mg via INTRAVENOUS
  Filled 2021-11-24 (×6): qty 5

## 2021-11-24 MED ORDER — ADULT MULTIVITAMIN W/MINERALS CH
1.0000 | ORAL_TABLET | Freq: Every day | ORAL | Status: DC
  Administered 2021-11-24 – 2021-12-02 (×7): 1 via ORAL
  Filled 2021-11-24 (×7): qty 1

## 2021-11-24 MED ORDER — ENSURE ENLIVE PO LIQD
237.0000 mL | Freq: Two times a day (BID) | ORAL | Status: DC
  Administered 2021-11-24: 237 mL via ORAL

## 2021-11-24 MED ORDER — DEXTROSE 50 % IV SOLN
25.0000 mL | Freq: Once | INTRAVENOUS | Status: AC
  Administered 2021-11-24: 25 mL via INTRAVENOUS

## 2021-11-24 MED ORDER — DEXTROSE 50 % IV SOLN
INTRAVENOUS | Status: AC
  Filled 2021-11-24: qty 50

## 2021-11-24 MED ORDER — POTASSIUM CHLORIDE 10 MEQ/50ML IV SOLN
10.0000 meq | INTRAVENOUS | Status: AC
  Administered 2021-11-24 (×6): 10 meq via INTRAVENOUS
  Filled 2021-11-24 (×6): qty 50

## 2021-11-24 MED ORDER — THIAMINE HCL 100 MG/ML IJ SOLN
100.0000 mg | INTRAMUSCULAR | Status: AC
  Administered 2021-11-26 – 2021-11-27 (×2): 100 mg via INTRAVENOUS
  Filled 2021-11-24 (×2): qty 2

## 2021-11-24 MED ORDER — STROKE: EARLY STAGES OF RECOVERY BOOK
Freq: Once | Status: AC
  Filled 2021-11-24: qty 1

## 2021-11-24 NOTE — Progress Notes (Signed)
Nutrition Follow-up  DOCUMENTATION CODES:   Not applicable  INTERVENTION:   - Ensure Enlive po BID, each supplement provides 350 kcal and 20 grams of protein  - MVI with minerals daily  - Encourage PO intake  NUTRITION DIAGNOSIS:   Inadequate oral intake related to inability to eat as evidenced by NPO status.  Progressing, pt now on regular diet with thin liquids  GOAL:   Patient will meet greater than or equal to 90% of their needs  Progressing  MONITOR:   PO intake, Supplement acceptance, Labs, Weight trends  REASON FOR ASSESSMENT:   Ventilator, Consult Enteral/tube feeding initiation and management  ASSESSMENT:   41 year-old male with medical history of cluster headaches and tobacco use. His family thought they heard a noise and when they went to investigate, they found him on rigid on the floor with L-sided weakness, slurred speech, and gaze preference. He was admitted with dx of R-sided ICA-MCA occlusion and was taken for mechanical thrombectomy.  09/25 - extubated 09/27 - s/p FEES, diet advanced to regular with thin liquids  Discussed pt with RN and during ICU rounds. Diet advanced to regular with thin liquids today after FEES. Spoke with pt and visitor at bedside. Pt drinking water from cup with straw without difficulty.  Pt reports good appetite and visitor states that he is requesting a smoothie. Pt amenable to consuming Ensure oral nutrition supplements. RD to order.  Pt reports eating 1-2 meals daily PTA. Pt's visitor states that pt does not eat enough. Pt thinks he may have lost a little weight but is unsure how much. He reports his UBW as 202 lbs. Current weight is above pt's reported UBW.  Per notes, pt drinks EtOH regularly ("most days") but will not quantify.  Admit weight: 105 kg Current weight: 98.1 kg  Medications reviewed and include: colace, IV protonix, miralax, thiamine, IV abx, IV KCl 10 mEq x 6 runs  Labs reviewed: sodium 151, potassium  3.0, WBC 13.8 CBG's: 71-89 x 24 hours  UOP: 1100 ml x 24 hours I/O's: -1.4 L since admit  NUTRITION - FOCUSED PHYSICAL EXAM:  Flowsheet Row Most Recent Value  Orbital Region No depletion  Upper Arm Region Mild depletion  Thoracic and Lumbar Region No depletion  Buccal Region No depletion  Temple Region No depletion  Clavicle Bone Region Mild depletion  Clavicle and Acromion Bone Region Mild depletion  Scapular Bone Region No depletion  Dorsal Hand No depletion  Patellar Region No depletion  Anterior Thigh Region No depletion  Posterior Calf Region No depletion  Edema (RD Assessment) None  Hair Reviewed  Eyes Reviewed  Mouth Reviewed  Skin Reviewed  Nails Reviewed       Diet Order:   Diet Order             Diet regular Room service appropriate? Yes; Fluid consistency: Thin  Diet effective now                   EDUCATION NEEDS:   Education needs have been addressed  Skin:  Skin Assessment: Reviewed RN Assessment  Last BM:  11/23/21  Height:   Ht Readings from Last 1 Encounters:  11/19/21 6' (1.829 m)    Weight:   Wt Readings from Last 1 Encounters:  11/24/21 98.1 kg    Ideal Body Weight:  80.9 kg  BMI:  Body mass index is 29.33 kg/m.  Estimated Nutritional Needs:   Kcal:  2400-2600  Protein:  120-135 grams  Fluid:  >/= 2.2 L    Gustavus Bryant, MS, RD, LDN Inpatient Clinical Dietitian Please see AMiON for contact information.

## 2021-11-24 NOTE — Progress Notes (Signed)
Inpatient Rehabilitation Admissions Coordinator   Pt's Mom provided me with [employer] manager's phon number to clarify payor. I spoke with her and she will check when she goes in to the office and let me know. If uninsured I will have to seek approval for [facility] admit. Otherwise we can plan to admit as uninsured as Mom would then pursue disability and medicaid.  Danne Baxter, RN, MSN Rehab Admissions Coordinator [phone] 11/24/2021 3:38 PM

## 2021-11-24 NOTE — Progress Notes (Addendum)
Patient had vomiting episode while eating and drinking, coughing with trouble clearing secretions,  mouth suctioned, desat to low 80, self recovering, provider made aware. Orders- NPO awaiting cortrack placement.

## 2021-11-24 NOTE — Progress Notes (Signed)
[facility] ADULT ICU REPLACEMENT PROTOCOL   The patient does apply for the [facility] Adult ICU Electrolyte Replacment Protocol based on the criteria listed below:   1.Exclusion criteria: TCTS patients, ECMO patients, and Dialysis patients 2. Is GFR >/= 30 ml/min? Yes.    Patient's GFR today is >60 3. Is SCr </= 2? Yes.   Patient's SCr is 0.9 mg/dL 4. Did SCr increase >/= 0.5 in 24 hours? No. 5.Pt's weight >40kg  Yes.   6. Abnormal electrolyte(s): K+3.0R. nall  7. Electrolytes replaced per protocol 8.  Call MD STAT for K+ </= 2.5, Phos </= 1, or Mag </= 1 Physician:  R. Nallamothu  Frank Warner 11/24/2021 5:45 AM
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
## 2021-11-24 NOTE — TOC Progression Note (Signed)
Transition of Care [facility]) - Progression Note    Patient Details  Name: Emerick Weatherly MRN: [id number] Date of Birth: 08-10-1980  Transition of Care [facility] CM/SW Contact  Tom-Johnson, Renea Ee, RN Phone Number: 11/24/2021, 1:55 PM  Clinical Narrative:     CIR recommended, needs clarification of payor as patient does not have insurance at this time.  CM will continue to follow with needs.   Expected Discharge Plan: IP Rehab Facility Barriers to Discharge: Continued Medical Work up  Expected Discharge Plan and Services Expected Discharge Plan: [place]   Discharge Planning Services: CM Consult Post Acute Care Choice: IP Rehab Living arrangements for the past 2 months: Single Family Home                                       Social Determinants of Health (SDOH) Interventions    Readmission Risk Interventions     No data to display
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
## 2021-11-25 LAB — CBC
HCT: 40.5 % (ref 39.0–52.0)
Hemoglobin: 13.4 g/dL (ref 13.0–17.0)
MCH: 33.8 pg (ref 26.0–34.0)
MCHC: 33.1 g/dL (ref 30.0–36.0)
MCV: 102 fL — ABNORMAL HIGH (ref 80.0–100.0)
Platelets: 189 10*3/uL (ref 150–400)
RBC: 3.97 MIL/uL — ABNORMAL LOW (ref 4.22–5.81)
RDW: 15.2 % (ref 11.5–15.5)
WBC: 11.7 10*3/uL — ABNORMAL HIGH (ref 4.0–10.5)
nRBC: 1.8 % — ABNORMAL HIGH (ref 0.0–0.2)

## 2021-11-25 LAB — PROTHROMBIN GENE MUTATION

## 2021-11-25 LAB — GLUCOSE, CAPILLARY
Glucose-Capillary: 103 mg/dL — ABNORMAL HIGH (ref 70–99)
Glucose-Capillary: 109 mg/dL — ABNORMAL HIGH (ref 70–99)
Glucose-Capillary: 118 mg/dL — ABNORMAL HIGH (ref 70–99)
Glucose-Capillary: 125 mg/dL — ABNORMAL HIGH (ref 70–99)
Glucose-Capillary: 76 mg/dL (ref 70–99)
Glucose-Capillary: 85 mg/dL (ref 70–99)
Glucose-Capillary: 86 mg/dL (ref 70–99)

## 2021-11-25 LAB — BASIC METABOLIC PANEL
Anion gap: 14 (ref 5–15)
BUN: 14 mg/dL (ref 6–20)
CO2: 24 mmol/L (ref 22–32)
Calcium: 9.2 mg/dL (ref 8.9–10.3)
Chloride: 111 mmol/L (ref 98–111)
Creatinine, Ser: 1 mg/dL (ref 0.61–1.24)
GFR, Estimated: >60 mL/min (ref >60)
Glucose, Bld: 100 mg/dL — ABNORMAL HIGH (ref 70–99)
Potassium: 3.1 mmol/L — ABNORMAL LOW (ref 3.5–5.1)
Sodium: 149 mmol/L — ABNORMAL HIGH (ref 135–145)

## 2021-11-25 LAB — PHOSPHORUS: Phosphorus: 3.9 mg/dL (ref 2.5–4.6)

## 2021-11-25 LAB — MAGNESIUM: Magnesium: 2.4 mg/dL (ref 1.7–2.4)

## 2021-11-25 LAB — SODIUM: Sodium: 149 mmol/L — ABNORMAL HIGH (ref 135–145)

## 2021-11-25 MED ORDER — ENSURE ENLIVE PO LIQD
237.0000 mL | Freq: Two times a day (BID) | ORAL | Status: DC
  Administered 2021-11-25 – 2021-12-02 (×10): 237 mL via ORAL

## 2021-11-25 MED ORDER — HEPARIN SODIUM (PORCINE) 5000 UNIT/ML IJ SOLN
5000.0000 [IU] | Freq: Three times a day (TID) | INTRAMUSCULAR | Status: DC
  Administered 2021-11-25 – 2021-12-02 (×21): 5000 [IU] via SUBCUTANEOUS
  Filled 2021-11-25 (×21): qty 1

## 2021-11-25 MED ORDER — POTASSIUM CHLORIDE 10 MEQ/50ML IV SOLN
10.0000 meq | INTRAVENOUS | Status: AC
  Administered 2021-11-25 (×6): 10 meq via INTRAVENOUS
  Filled 2021-11-25 (×6): qty 50

## 2021-11-25 MED ORDER — DEXTROSE 5 % IV SOLN: INTRAVENOUS | Status: DC

## 2021-11-25 MED ORDER — SENNOSIDES-DOCUSATE SODIUM 8.6-50 MG PO TABS: 1.0000 | ORAL_TABLET | Freq: Every evening | ORAL | Status: DC | PRN

## 2021-11-25 MED ORDER — DEXTROSE 50 % IV SOLN
25.0000 mL | Freq: Once | INTRAVENOUS | Status: AC
  Administered 2021-11-25: 25 mL via INTRAVENOUS

## 2021-11-25 MED ORDER — SODIUM CHLORIDE 0.9 % IV SOLN: INTRAVENOUS | Status: DC

## 2021-11-25 MED ORDER — DOCUSATE SODIUM 100 MG PO CAPS
100.0000 mg | ORAL_CAPSULE | Freq: Two times a day (BID) | ORAL | Status: DC
  Administered 2021-11-29 – 2021-12-02 (×7): 100 mg via ORAL
  Filled 2021-11-25 (×12): qty 1

## 2021-11-25 MED ORDER — POLYETHYLENE GLYCOL 3350 17 G PO PACK
17.0000 g | PACK | Freq: Two times a day (BID) | ORAL | Status: DC
  Administered 2021-11-29 – 2021-12-02 (×3): 17 g via ORAL
  Filled 2021-11-25 (×10): qty 1

## 2021-11-25 NOTE — Progress Notes (Signed)
Nutrition Follow-up  DOCUMENTATION CODES:   Not applicable  INTERVENTION:   - Ensure Enlive po BID, each supplement provides 350 kcal and 20 grams of protein   - MVI with minerals daily   - Encourage PO intake  NUTRITION DIAGNOSIS:   Inadequate oral intake related to inability to eat as evidenced by NPO status.  Progressing, pt now on dysphagia 2 diet with thin liquids  GOAL:   Patient will meet greater than or equal to 90% of their needs  Progressing  MONITOR:   PO intake, Supplement acceptance, Labs, Weight trends  REASON FOR ASSESSMENT:   Consult Enteral/tube feeding initiation and management  ASSESSMENT:   41 year-old male with medical history of cluster headaches and tobacco use. His family thought they heard a noise and when they went to investigate, they found him on rigid on the floor with L-sided weakness, slurred speech, and gaze preference. He was admitted with dx of R-sided ICA-MCA occlusion and was taken for mechanical thrombectomy.  09/25 - extubated 09/27 - s/p FEES, diet advanced to regular with thin liquids, later had emesis with PO intake and difficulty clearing secretions, NPO  Discussed pt with RN and during ICU rounds. Consult received for enteral nutrition initiation and management and Cortrak order in place. After SLP evaluation, pt was able to have diet advanced to dysphagia 2 with thin liquids. Plan is to hold off on Cortrak at this time and encourage PO intake.  Spoke with pt and significant other at bedside. Pt has consumed a lot of applesauce since diet was advanced earlier. He did not like anything on his lunch meal tray. Significant other reports pt has consumed some of her food. Pt willing to drink Ensure supplements.  Admit weight: 105 kg Current weight: 99.9 kg  Medications reviewed and include: colace, MVI with minerals, IV protonix, miralax, thiamine, IV abx, IV KCl 10 mEq x 6 runs IVF: D5 @ 50 ml/hr  Labs reviewed: sodium 149,  potassium 3.1, WBC 11.7 CBG's: 62-118 x  24 hours  UOP: 1050 ml x 24 hours I/O's: -1.8 L since admit  Diet Order:   Diet Order             DIET DYS 2 Room service appropriate? Yes; Fluid consistency: Thin  Diet effective now                   EDUCATION NEEDS:   Education needs have been addressed  Skin:  Skin Assessment: Reviewed RN Assessment  Last BM:  11/23/21  Height:   Ht Readings from Last 1 Encounters:  11/19/21 6' (1.829 m)    Weight:   Wt Readings from Last 1 Encounters:  11/25/21 99.9 kg    Ideal Body Weight:  80.9 kg  BMI:  Body mass index is 29.87 kg/m.  Estimated Nutritional Needs:   Kcal:  2400-2600  Protein:  120-135 grams  Fluid:  >/= 2.2 L    Gustavus Bryant, MS, RD, LDN Inpatient Clinical Dietitian Please see AMiON for contact information.

## 2021-11-25 NOTE — Progress Notes (Addendum)
Inpatient Rehabilitation Admissions Coordinator   I spoke again to [place], [place] to clarify medical insurance. She states she has contacted his Mom to assist with her with finding the information in his phone APP.  Danne Baxter, RN, MSN Rehab Admissions Coordinator [phone] 11/25/2021 5:10 PM
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
## 2021-11-25 NOTE — Progress Notes (Signed)
[facility] ADULT ICU REPLACEMENT PROTOCOL   The patient does apply for the [facility] Adult ICU Electrolyte Replacment Protocol based on the criteria listed below:   1.Exclusion criteria: TCTS patients, ECMO patients, and Dialysis patients 2. Is GFR >/= 30 ml/min? Yes.    Patient's GFR today is >60 3. Is SCr </= 2? Yes.   Patient's SCr is 1.0 mg/dL 4. Did SCr increase >/= 0.5 in 24 hours? No. 5.Pt's weight >40kg  Yes.   6. Abnormal electrolyte(s): K 3.1  7. Electrolytes replaced per protocol 8.  Call MD STAT for K+ </= 2.5, Phos </= 1, or Mag </= 1 Physician:    Ronda Fairly A 11/25/2021 5:45 AM
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
## 2021-11-25 NOTE — Progress Notes (Signed)
eLink Physician-Brief Progress Note Patient Name: Frank Warner DOB: May 10, 1980 MRN: [id number]   Date of Service  11/25/2021  HPI/Events of Note  77M s/p right MCA stroke s/p revascularization. Passed swallow however bedside RN reports episode of emesis and desaturations during day shift.  Overnight, CBG in 60s. No feeds and currently NPO  eICU Interventions  D5 gtt @50cc  Primary team to re-evaluate in AM      Intervention Category Minor Interventions: Electrolytes abnormality - evaluation and management  Cendy Oconnor Rodman Pickle 11/25/2021, 12:11 AM

## 2021-11-25 NOTE — Progress Notes (Signed)
   Frank Warner has been requested to perform a transesophageal echocardiogram on Frank Warner for stroke.  After careful review of history and examination, the risks and benefits of transesophageal echocardiogram have been explained including risks of esophageal damage, perforation (1:10,000 risk), bleeding, pharyngeal hematoma as well as other potential complications associated with conscious sedation including aspiration, arrhythmia, respiratory failure and death. Alternatives to treatment were discussed, questions were answered. Patient is willing to proceed.  Pt was sleeping, but could be aroused and able to repeat back to me the procedure.   Pt is scheduled for TEE tomorrow at 2pm with Dr. Harrell Gave. NPO at MN please.   [name], [place]  11/25/2021 3:10 PM
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
## 2021-11-26 ENCOUNTER — Encounter (HOSPITAL_COMMUNITY)
Admission: EM | Disposition: A | Discharge status: Rehabilitation Facility | Payer: Self-pay | Source: Home / Self Care | Attending: Neurology

## 2021-11-26 ENCOUNTER — Inpatient Hospital Stay (HOSPITAL_COMMUNITY): Payer: Self-pay | Admitting: Certified Registered Nurse Anesthetist

## 2021-11-26 ENCOUNTER — Encounter (HOSPITAL_COMMUNITY): Payer: Self-pay | Admitting: Neurology

## 2021-11-26 ENCOUNTER — Inpatient Hospital Stay (HOSPITAL_COMMUNITY): Payer: Self-pay

## 2021-11-26 DIAGNOSIS — I071 Rheumatic tricuspid insufficiency: Secondary | ICD-10-CM

## 2021-11-26 DIAGNOSIS — F17210 Nicotine dependence, cigarettes, uncomplicated: Secondary | ICD-10-CM

## 2021-11-26 DIAGNOSIS — I639 Cerebral infarction, unspecified: Secondary | ICD-10-CM

## 2021-11-26 DIAGNOSIS — J9601 Acute respiratory failure with hypoxia: Secondary | ICD-10-CM

## 2021-11-26 HISTORY — PX: BUBBLE STUDY: SHX6837

## 2021-11-26 HISTORY — PX: TEE WITHOUT CARDIOVERSION: SHX5443

## 2021-11-26 LAB — GLUCOSE, CAPILLARY
Glucose-Capillary: 79 mg/dL (ref 70–99)
Glucose-Capillary: 83 mg/dL (ref 70–99)
Glucose-Capillary: 84 mg/dL (ref 70–99)
Glucose-Capillary: 91 mg/dL (ref 70–99)
Glucose-Capillary: 93 mg/dL (ref 70–99)

## 2021-11-26 LAB — BASIC METABOLIC PANEL
Anion gap: 6 (ref 5–15)
BUN: 10 mg/dL (ref 6–20)
CO2: 23 mmol/L (ref 22–32)
Calcium: 8.1 mg/dL — ABNORMAL LOW (ref 8.9–10.3)
Chloride: 104 mmol/L (ref 98–111)
Creatinine, Ser: 0.89 mg/dL (ref 0.61–1.24)
GFR, Estimated: >60 mL/min (ref >60)
Glucose, Bld: 300 mg/dL — ABNORMAL HIGH (ref 70–99)
Potassium: 2.9 mmol/L — ABNORMAL LOW (ref 3.5–5.1)
Sodium: 133 mmol/L — ABNORMAL LOW (ref 135–145)

## 2021-11-26 LAB — CBC
HCT: 38.2 % — ABNORMAL LOW (ref 39.0–52.0)
Hemoglobin: 13.3 g/dL (ref 13.0–17.0)
MCH: 34.5 pg — ABNORMAL HIGH (ref 26.0–34.0)
MCHC: 34.8 g/dL (ref 30.0–36.0)
MCV: 99 fL (ref 80.0–100.0)
Platelets: 207 10*3/uL (ref 150–400)
RBC: 3.86 MIL/uL — ABNORMAL LOW (ref 4.22–5.81)
RDW: 14.6 % (ref 11.5–15.5)
WBC: 11.9 10*3/uL — ABNORMAL HIGH (ref 4.0–10.5)
nRBC: 1.3 % — ABNORMAL HIGH (ref 0.0–0.2)

## 2021-11-26 LAB — FACTOR 5 LEIDEN

## 2021-11-26 SURGERY — ECHOCARDIOGRAM, TRANSESOPHAGEAL
Anesthesia: Monitor Anesthesia Care

## 2021-11-26 MED ORDER — POTASSIUM CHLORIDE 10 MEQ/50ML IV SOLN
10.0000 meq | INTRAVENOUS | Status: DC
  Administered 2021-11-26 (×2): 10 meq via INTRAVENOUS
  Filled 2021-11-26 (×5): qty 50

## 2021-11-26 MED ORDER — GLYCOPYRROLATE PF 0.2 MG/ML IJ SOSY
PREFILLED_SYRINGE | INTRAMUSCULAR | Status: DC | PRN
  Administered 2021-11-26: .1 mg via INTRAVENOUS

## 2021-11-26 MED ORDER — BUTAMBEN-TETRACAINE-BENZOCAINE 2-2-14 % EX AERO
INHALATION_SPRAY | CUTANEOUS | Status: DC | PRN
  Administered 2021-11-26: 1 via TOPICAL

## 2021-11-26 MED ORDER — POTASSIUM CHLORIDE 10 MEQ/50ML IV SOLN
10.0000 meq | INTRAVENOUS | Status: AC
  Administered 2021-11-26 (×4): 10 meq via INTRAVENOUS
  Filled 2021-11-26 (×4): qty 50

## 2021-11-26 MED ORDER — PANTOPRAZOLE SODIUM 40 MG PO TBEC
40.0000 mg | DELAYED_RELEASE_TABLET | Freq: Every day | ORAL | Status: DC
  Administered 2021-11-27 – 2021-12-02 (×6): 40 mg via ORAL
  Filled 2021-11-26 (×6): qty 1

## 2021-11-26 MED ORDER — DEXTROSE-NACL 5-0.9 % IV SOLN: INTRAVENOUS | Status: DC

## 2021-11-26 MED ORDER — DEXMEDETOMIDINE HCL IN NACL 80 MCG/20ML IV SOLN
INTRAVENOUS | Status: DC | PRN
  Administered 2021-11-26: 12 ug via BUCCAL
  Administered 2021-11-26: 8 ug via BUCCAL

## 2021-11-26 MED ORDER — PROPOFOL 10 MG/ML IV BOLUS
INTRAVENOUS | Status: DC | PRN
  Administered 2021-11-26 (×2): 30 mg via INTRAVENOUS
  Administered 2021-11-26: 20 mg via INTRAVENOUS

## 2021-11-26 MED ORDER — PROPOFOL 500 MG/50ML IV EMUL
INTRAVENOUS | Status: DC | PRN
  Administered 2021-11-26 (×2): 160 ug/kg/min via INTRAVENOUS

## 2021-11-26 NOTE — Interval H&P Note (Signed)
History and Physical Interval Note:  11/26/2021 1:50 PM  Frank Warner  has presented today for surgery, with the diagnosis of STROKE.  The various methods of treatment have been discussed with the patient and family. After consideration of risks, benefits and other options for treatment, the patient has consented to  Procedure(s): TRANSESOPHAGEAL ECHOCARDIOGRAM (TEE) (N/A) as a surgical intervention.  The patient's history has been reviewed, patient examined, no change in status, stable for surgery.  I have reviewed the patient's chart and labs.  Questions were answered to the patient's satisfaction.     Iqra Rotundo Harrell Gave

## 2021-11-26 NOTE — Anesthesia Procedure Notes (Signed)
Procedure Name: MAC Date/Time: 11/26/2021 2:16 PM  Performed by: Janace Litten, CRNAPre-anesthesia Checklist: Patient identified, Emergency Drugs available, Suction available and Patient being monitored Patient Re-evaluated:Patient Re-evaluated prior to induction Oxygen Delivery Method: Nasal cannula
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
## 2021-11-26 NOTE — Anesthesia Postprocedure Evaluation (Signed)
Anesthesia Post Note  Patient: Frank Warner  Procedure(s) Performed: TRANSESOPHAGEAL ECHOCARDIOGRAM (TEE) BUBBLE STUDY     Patient location during evaluation: Endoscopy Anesthesia Type: MAC Level of consciousness: awake Pain management: pain level controlled Respiratory status: spontaneous breathing Cardiovascular status: stable Postop Assessment: no apparent nausea or vomiting Anesthetic complications: no   No notable events documented.  Last Vitals:  Vitals:   11/26/21 1450 11/26/21 1500  BP: (!) 95/58 100/63  Pulse: 84 73  Resp: (!) 32 (!) 28  Temp:    SpO2: 99% 99%    Last Pain:  Vitals:   11/26/21 1440  TempSrc: Temporal  PainSc:                  Frank Warner

## 2021-11-26 NOTE — Transfer of Care (Signed)
Immediate Anesthesia Transfer of Care Note  Patient: Frank Warner  Procedure(s) Performed: TRANSESOPHAGEAL ECHOCARDIOGRAM (TEE) BUBBLE STUDY  Patient Location: PACU  Anesthesia Type:MAC  Level of Consciousness: drowsy and responds to stimulation  Airway & Oxygen Therapy: Patient Spontanous Breathing  Post-op Assessment: Report given to RN and Post -op Vital signs reviewed and stable  Post vital signs: Reviewed and stable  Last Vitals:  Vitals Value Taken Time  BP    Temp    Pulse    Resp    SpO2      Last Pain:  Vitals:   11/26/21 1324  TempSrc: Temporal  PainSc: 0-No pain         Complications: No notable events documented.

## 2021-11-26 NOTE — Progress Notes (Signed)
PT Cancellation Note  Patient Details Name: Jamail Cullers MRN: [id number] DOB: 12-Jan-1981   Cancelled Treatment:    Reason Eval/Treat Not Completed: Patient at procedure or test/unavailable.  Pt gone a good while to TEE, now unable to get back today. 11/26/2021  [name]., PT Acute Rehabilitation Services [phone]  (office)   [name] Caroline Longie 11/26/2021, 4:39 PM

## 2021-11-26 NOTE — Anesthesia Preprocedure Evaluation (Signed)
Anesthesia Evaluation  Patient identified by MRN, date of birth, ID band Patient awake    Reviewed: Allergy & Precautions, NPO status , Patient's Chart, lab work & pertinent test results  Airway Mallampati: II       Dental no notable dental hx.    Pulmonary neg pulmonary ROS, Current Smoker and Patient abstained from smoking.,    Pulmonary exam normal        Cardiovascular negative cardio ROS   Rhythm:Regular Rate:Normal     Neuro/Psych  Headaches, CVA negative psych ROS   GI/Hepatic negative GI ROS, Neg liver ROS,   Endo/Other  negative endocrine ROS  Renal/GU negative Renal ROS  negative genitourinary   Musculoskeletal negative musculoskeletal ROS (+)   Abdominal Normal abdominal exam  (+)   Peds  Hematology negative hematology ROS (+)   Anesthesia Other Findings   Reproductive/Obstetrics                            Anesthesia Physical Anesthesia Plan  ASA: 3  Anesthesia Plan: MAC   Post-op Pain Management:    Induction: Intravenous  PONV Risk Score and Plan: Propofol infusion and Treatment may vary due to age or medical condition  Airway Management Planned: Natural Airway, Simple Face Mask and Nasal Cannula  Additional Equipment: None  Intra-op Plan:   Post-operative Plan:   Informed Consent: I have reviewed the patients History and Physical, chart, labs and discussed the procedure including the risks, benefits and alternatives for the proposed anesthesia with the patient or authorized representative who has indicated his/her understanding and acceptance.     Dental advisory given  Plan Discussed with: CRNA  Anesthesia Plan Comments:         Anesthesia Quick Evaluation

## 2021-11-26 NOTE — CV Procedure (Signed)
    TRANSESOPHAGEAL ECHOCARDIOGRAM   NAME:  Frank Warner   MRN: [id number] DOB:  09/29/1980   ADMIT DATE: 11/19/2021  INDICATIONS: CVA  PROCEDURE:   Informed consent was obtained prior to the procedure. The risks, benefits and alternatives for the procedure were discussed and the patient comprehended these risks.  Risks include, but are not limited to, cough, sore throat, vomiting, nausea, somnolence, esophageal and stomach trauma or perforation, bleeding, low blood pressure, aspiration, pneumonia, infection, trauma to the teeth and death.    Procedural time out performed. The oropharynx was anesthetized with topical 1% cetacaine.    Patient received monitored anesthesia care under the supervision of Dr. Nyoka Cowden. Patient received a total  propofol (DIPRIVAN) BOLUS only 80 mg  propofol (DIPRIVAN) infusion 10mg /mL 283.72 mg  dexmedetomidine (PRECEDEX) 80 mcg/39mL 20 mcg  glycopyrrolate (ROBINUL) 0.2mg /ml SYRINGE 0.1 mg   during the procedure.  The transesophageal probe was inserted in the esophagus and stomach without difficulty and multiple views were obtained.    COMPLICATIONS:    There were no immediate complications.  FINDINGS:  LEFT VENTRICLE: EF = 55-60%. No regional wall motion abnormalities.  RIGHT VENTRICLE: Normal size and function.   LEFT ATRIUM: No thrombus/mass.  LEFT ATRIAL APPENDAGE: No thrombus/mass.   RIGHT ATRIUM: No thrombus/mass.  AORTIC VALVE:  Trileaflet. No regurgitation. No vegetation.  MITRAL VALVE:    Normal structure. No regurgitation. No vegetation.  TRICUSPID VALVE: Normal structure. Trivial regurgitation. No vegetation.  PULMONIC VALVE: Grossly normal structure. No significant regurgitation. No apparent vegetation.  INTERATRIAL SEPTUM: No PFO or ASD seen by color Doppler. Agitated saline contrast showed no intra-atrial shunt. Late positive bubble study suggests non-cardiac shunt.  PERICARDIUM: No effusion noted.  DESCENDING AORTA: No  significant plaque seen   CONCLUSION: No intracardiac source of embolism. No evidence of intra-atrial shunt. Late positive bubble suggests non-cardiac shunt.   Buford Dresser, MD, PhD [place]  [facility]   2:36 PM

## 2021-11-26 NOTE — Progress Notes (Signed)
Report called to 3W. Receiving RN with no questions at this time. Endo called and patient will go straight to bed 3W18 from endo. Patient's mother, [name], notified of patient transferring rooms. Will take patient belongings to new room.

## 2021-11-26 NOTE — Progress Notes (Signed)
SLP Cancellation Note  Patient Details Name: Frank Warner MRN: [id number] DOB: 1980/12/12   Cancelled treatment:       Reason Eval/Treat Not Completed: Medical issues which prohibited therapy  Unable to assess diet tolerance at this time, as pt is currently NPO for TEE this afternoon. RN reports pt is impulsive with self feeding, intermittent cough noted. SLP will continue efforts to assess diet tolerance.  Kember Boch B. Quentin Ore, [facility], [place] Speech Language Pathologist Office: [phone]  Shonna Chock 11/26/2021, 10:38 AM

## 2021-11-26 NOTE — Progress Notes (Signed)
  Echocardiogram Echocardiogram Transesophageal has been performed.  Frank Warner 11/26/2021, 3:04 PM

## 2021-11-27 ENCOUNTER — Inpatient Hospital Stay (HOSPITAL_COMMUNITY): Payer: Self-pay

## 2021-11-27 DIAGNOSIS — F17200 Nicotine dependence, unspecified, uncomplicated: Secondary | ICD-10-CM

## 2021-11-27 DIAGNOSIS — E782 Mixed hyperlipidemia: Secondary | ICD-10-CM

## 2021-11-27 LAB — BASIC METABOLIC PANEL
Anion gap: 6 (ref 5–15)
BUN: 10 mg/dL (ref 6–20)
CO2: 20 mmol/L — ABNORMAL LOW (ref 22–32)
Calcium: 8 mg/dL — ABNORMAL LOW (ref 8.9–10.3)
Chloride: 114 mmol/L — ABNORMAL HIGH (ref 98–111)
Creatinine, Ser: 0.95 mg/dL (ref 0.61–1.24)
GFR, Estimated: >60 mL/min (ref >60)
Glucose, Bld: 344 mg/dL — ABNORMAL HIGH (ref 70–99)
Potassium: 3.1 mmol/L — ABNORMAL LOW (ref 3.5–5.1)
Sodium: 140 mmol/L (ref 135–145)

## 2021-11-27 LAB — CBC
HCT: 37.6 % — ABNORMAL LOW (ref 39.0–52.0)
Hemoglobin: 13 g/dL (ref 13.0–17.0)
MCH: 34.4 pg — ABNORMAL HIGH (ref 26.0–34.0)
MCHC: 34.6 g/dL (ref 30.0–36.0)
MCV: 99.5 fL (ref 80.0–100.0)
Platelets: 239 10*3/uL (ref 150–400)
RBC: 3.78 MIL/uL — ABNORMAL LOW (ref 4.22–5.81)
RDW: 14.5 % (ref 11.5–15.5)
WBC: 12.3 10*3/uL — ABNORMAL HIGH (ref 4.0–10.5)
nRBC: 0.2 % (ref 0.0–0.2)

## 2021-11-27 LAB — GLUCOSE, CAPILLARY
Glucose-Capillary: 103 mg/dL — ABNORMAL HIGH (ref 70–99)
Glucose-Capillary: 105 mg/dL — ABNORMAL HIGH (ref 70–99)
Glucose-Capillary: 105 mg/dL — ABNORMAL HIGH (ref 70–99)
Glucose-Capillary: 106 mg/dL — ABNORMAL HIGH (ref 70–99)
Glucose-Capillary: 132 mg/dL — ABNORMAL HIGH (ref 70–99)
Glucose-Capillary: 93 mg/dL (ref 70–99)

## 2021-11-27 MED ORDER — LEVETIRACETAM 750 MG PO TABS: 1500.0000 mg | ORAL_TABLET | Freq: Two times a day (BID) | ORAL | Status: DC

## 2021-11-27 MED ORDER — LEVETIRACETAM 750 MG PO TABS
1500.0000 mg | ORAL_TABLET | Freq: Two times a day (BID) | ORAL | Status: DC
  Administered 2021-11-27 – 2021-12-02 (×10): 1500 mg via ORAL
  Filled 2021-11-27 (×10): qty 2

## 2021-11-27 MED ORDER — ASPIRIN 81 MG PO TBEC
81.0000 mg | DELAYED_RELEASE_TABLET | Freq: Every day | ORAL | Status: DC
  Administered 2021-11-27 – 2021-12-02 (×6): 81 mg via ORAL
  Filled 2021-11-27 (×6): qty 1

## 2021-11-27 MED ORDER — POTASSIUM CHLORIDE CRYS ER 20 MEQ PO TBCR
60.0000 meq | EXTENDED_RELEASE_TABLET | Freq: Once | ORAL | Status: AC
  Administered 2021-11-27: 60 meq via ORAL
  Filled 2021-11-27: qty 3

## 2021-11-27 MED ORDER — ROSUVASTATIN CALCIUM 20 MG PO TABS
40.0000 mg | ORAL_TABLET | Freq: Every day | ORAL | Status: DC
  Administered 2021-11-27 – 2021-12-02 (×6): 40 mg via ORAL
  Filled 2021-11-27 (×6): qty 2

## 2021-11-27 NOTE — Plan of Care (Signed)
  Problem: Education: Goal: Knowledge of General Education information will improve Description: Including pain rating scale, medication(s)/side effects and non-pharmacologic comfort measures Outcome: Progressing   Problem: Health Behavior/Discharge Planning: Goal: Ability to manage health-related needs will improve Outcome: Progressing   Problem: Clinical Measurements: Goal: Ability to maintain clinical measurements within normal limits will improve Outcome: Progressing Goal: Will remain free from infection Outcome: Progressing Goal: Diagnostic test results will improve Outcome: Progressing Goal: Respiratory complications will improve Outcome: Progressing Goal: Cardiovascular complication will be avoided Outcome: Progressing   Problem: Activity: Goal: Risk for activity intolerance will decrease Outcome: Progressing   Problem: Nutrition: Goal: Adequate nutrition will be maintained Outcome: Progressing   Problem: Coping: Goal: Level of anxiety will decrease Outcome: Progressing   Problem: Elimination: Goal: Will not experience complications related to bowel motility Outcome: Progressing Goal: Will not experience complications related to urinary retention Outcome: Progressing   Problem: Pain Managment: Goal: General experience of comfort will improve Outcome: Progressing   Problem: Safety: Goal: Ability to remain free from injury will improve Outcome: Progressing   Problem: Skin Integrity: Goal: Risk for impaired skin integrity will decrease Outcome: Progressing   Problem: Education: Goal: Understanding of CV disease, CV risk reduction, and recovery process will improve Outcome: Progressing Goal: Individualized Educational Video(s) Outcome: Progressing   Problem: Activity: Goal: Ability to return to baseline activity level will improve Outcome: Progressing   Problem: Cardiovascular: Goal: Ability to achieve and maintain adequate cardiovascular perfusion  will improve Outcome: Progressing Goal: Vascular access site(s) Level 0-1 will be maintained Outcome: Progressing   Problem: Health Behavior/Discharge Planning: Goal: Ability to safely manage health-related needs after discharge will improve Outcome: Progressing   Problem: Activity: Goal: Ability to tolerate increased activity will improve Outcome: Progressing   Problem: Respiratory: Goal: Ability to maintain a clear airway and adequate ventilation will improve Outcome: Progressing   Problem: Role Relationship: Goal: Method of communication will improve Outcome: Progressing   Problem: Safety: Goal: Non-violent Restraint(s) Outcome: Progressing   
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
## 2021-11-28 ENCOUNTER — Encounter (HOSPITAL_COMMUNITY): Payer: Self-pay

## 2021-11-28 ENCOUNTER — Encounter (HOSPITAL_COMMUNITY): Payer: Self-pay | Admitting: Cardiology

## 2021-11-28 LAB — URINALYSIS, ROUTINE W REFLEX MICROSCOPIC
Bilirubin Urine: NEGATIVE
Glucose, UA: NEGATIVE mg/dL
Hgb urine dipstick: NEGATIVE
Ketones, ur: NEGATIVE mg/dL
Leukocytes,Ua: NEGATIVE
Nitrite: NEGATIVE
Protein, ur: NEGATIVE mg/dL
Specific Gravity, Urine: 1.015 (ref 1.005–1.030)
pH: 6 (ref 5.0–8.0)

## 2021-11-28 LAB — CBC
HCT: 40.6 % (ref 39.0–52.0)
Hemoglobin: 14.2 g/dL (ref 13.0–17.0)
MCH: 34.5 pg — ABNORMAL HIGH (ref 26.0–34.0)
MCHC: 35 g/dL (ref 30.0–36.0)
MCV: 98.5 fL (ref 80.0–100.0)
Platelets: 282 10*3/uL (ref 150–400)
RBC: 4.12 MIL/uL — ABNORMAL LOW (ref 4.22–5.81)
RDW: 14.4 % (ref 11.5–15.5)
WBC: 16.7 10*3/uL — ABNORMAL HIGH (ref 4.0–10.5)
nRBC: 0.1 % (ref 0.0–0.2)

## 2021-11-28 LAB — GLUCOSE, CAPILLARY
Glucose-Capillary: 108 mg/dL — ABNORMAL HIGH (ref 70–99)
Glucose-Capillary: 83 mg/dL (ref 70–99)
Glucose-Capillary: 107 mg/dL — ABNORMAL HIGH (ref 70–99)
Glucose-Capillary: 110 mg/dL — ABNORMAL HIGH (ref 70–99)
Glucose-Capillary: 107 mg/dL — ABNORMAL HIGH (ref 70–99)

## 2021-11-28 LAB — BASIC METABOLIC PANEL
Anion gap: 4 — ABNORMAL LOW (ref 5–15)
BUN: 11 mg/dL (ref 6–20)
CO2: 23 mmol/L (ref 22–32)
Calcium: 9 mg/dL (ref 8.9–10.3)
Chloride: 109 mmol/L (ref 98–111)
Creatinine, Ser: 0.87 mg/dL (ref 0.61–1.24)
GFR, Estimated: >60 mL/min (ref >60)
Glucose, Bld: 105 mg/dL — ABNORMAL HIGH (ref 70–99)
Potassium: 3.6 mmol/L (ref 3.5–5.1)
Sodium: 136 mmol/L (ref 135–145)
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
## 2021-11-28 NOTE — Plan of Care (Signed)
  Problem: Education: Goal: Knowledge of General Education information will improve Description: Including pain rating scale, medication(s)/side effects and non-pharmacologic comfort measures Outcome: Progressing   Problem: Health Behavior/Discharge Planning: Goal: Ability to manage health-related needs will improve Outcome: Progressing   Problem: Clinical Measurements: Goal: Ability to maintain clinical measurements within normal limits will improve Outcome: Progressing Goal: Will remain free from infection Outcome: Progressing Goal: Diagnostic test results will improve Outcome: Progressing Goal: Respiratory complications will improve Outcome: Progressing Goal: Cardiovascular complication will be avoided Outcome: Progressing   Problem: Activity: Goal: Risk for activity intolerance will decrease Outcome: Progressing   Problem: Nutrition: Goal: Adequate nutrition will be maintained Outcome: Progressing   Problem: Coping: Goal: Level of anxiety will decrease Outcome: Progressing   Problem: Elimination: Goal: Will not experience complications related to bowel motility Outcome: Progressing Goal: Will not experience complications related to urinary retention Outcome: Progressing   Problem: Pain Managment: Goal: General experience of comfort will improve Outcome: Progressing   Problem: Safety: Goal: Ability to remain free from injury will improve Outcome: Progressing   Problem: Skin Integrity: Goal: Risk for impaired skin integrity will decrease Outcome: Progressing   Problem: Education: Goal: Understanding of CV disease, CV risk reduction, and recovery process will improve Outcome: Progressing Goal: Individualized Educational Video(s) Outcome: Progressing   Problem: Activity: Goal: Ability to return to baseline activity level will improve Outcome: Progressing   Problem: Cardiovascular: Goal: Ability to achieve and maintain adequate cardiovascular perfusion  will improve Outcome: Progressing Goal: Vascular access site(s) Level 0-1 will be maintained Outcome: Progressing   Problem: Health Behavior/Discharge Planning: Goal: Ability to safely manage health-related needs after discharge will improve Outcome: Progressing   Problem: Activity: Goal: Ability to tolerate increased activity will improve Outcome: Progressing   Problem: Respiratory: Goal: Ability to maintain a clear airway and adequate ventilation will improve Outcome: Progressing   Problem: Role Relationship: Goal: Method of communication will improve Outcome: Progressing   Problem: Safety: Goal: Non-violent Restraint(s) Outcome: Progressing

## 2021-11-29 ENCOUNTER — Inpatient Hospital Stay (HOSPITAL_COMMUNITY): Payer: Self-pay

## 2021-11-29 DIAGNOSIS — R569 Unspecified convulsions: Secondary | ICD-10-CM

## 2021-11-29 DIAGNOSIS — D72829 Elevated white blood cell count, unspecified: Secondary | ICD-10-CM

## 2021-11-29 DIAGNOSIS — Z720 Tobacco use: Secondary | ICD-10-CM

## 2021-11-29 DIAGNOSIS — F1291 Cannabis use, unspecified, in remission: Secondary | ICD-10-CM

## 2021-11-29 DIAGNOSIS — I10 Essential (primary) hypertension: Secondary | ICD-10-CM | POA: Diagnosis present

## 2021-11-29 DIAGNOSIS — E785 Hyperlipidemia, unspecified: Secondary | ICD-10-CM

## 2021-11-29 DIAGNOSIS — Z87891 Personal history of nicotine dependence: Secondary | ICD-10-CM

## 2021-11-29 DIAGNOSIS — F1910 Other psychoactive substance abuse, uncomplicated: Secondary | ICD-10-CM

## 2021-11-29 LAB — CBC
HCT: 39.6 % (ref 39.0–52.0)
Hemoglobin: 14.4 g/dL (ref 13.0–17.0)
MCH: 35.5 pg — ABNORMAL HIGH (ref 26.0–34.0)
MCHC: 36.4 g/dL — ABNORMAL HIGH (ref 30.0–36.0)
MCV: 97.5 fL (ref 80.0–100.0)
Platelets: 367 10*3/uL (ref 150–400)
RBC: 4.06 MIL/uL — ABNORMAL LOW (ref 4.22–5.81)
RDW: 14.4 % (ref 11.5–15.5)
WBC: 17 10*3/uL — ABNORMAL HIGH (ref 4.0–10.5)
nRBC: 0 % (ref 0.0–0.2)

## 2021-11-29 LAB — GLUCOSE, CAPILLARY
Glucose-Capillary: 79 mg/dL (ref 70–99)
Glucose-Capillary: 82 mg/dL (ref 70–99)
Glucose-Capillary: 84 mg/dL (ref 70–99)
Glucose-Capillary: 86 mg/dL (ref 70–99)
Glucose-Capillary: 88 mg/dL (ref 70–99)
Glucose-Capillary: 89 mg/dL (ref 70–99)

## 2021-11-29 LAB — BASIC METABOLIC PANEL
Anion gap: 7 (ref 5–15)
BUN: 14 mg/dL (ref 6–20)
CO2: 20 mmol/L — ABNORMAL LOW (ref 22–32)
Calcium: 8.4 mg/dL — ABNORMAL LOW (ref 8.9–10.3)
Chloride: 109 mmol/L (ref 98–111)
Creatinine, Ser: 1.11 mg/dL (ref 0.61–1.24)
GFR, Estimated: >60 mL/min (ref >60)
Glucose, Bld: 88 mg/dL (ref 70–99)
Potassium: 3.5 mmol/L (ref 3.5–5.1)
Sodium: 136 mmol/L (ref 135–145)
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
## 2021-11-29 NOTE — Plan of Care (Signed)
  Problem: Education: Goal: Knowledge of General Education information will improve Description: Including pain rating scale, medication(s)/side effects and non-pharmacologic comfort measures Outcome: Progressing   Problem: Health Behavior/Discharge Planning: Goal: Ability to manage health-related needs will improve Outcome: Progressing   Problem: Clinical Measurements: Goal: Ability to maintain clinical measurements within normal limits will improve Outcome: Progressing Goal: Will remain free from infection Outcome: Progressing Goal: Diagnostic test results will improve Outcome: Progressing Goal: Respiratory complications will improve Outcome: Progressing Goal: Cardiovascular complication will be avoided Outcome: Progressing   Problem: Activity: Goal: Risk for activity intolerance will decrease Outcome: Progressing   Problem: Nutrition: Goal: Adequate nutrition will be maintained Outcome: Progressing   Problem: Coping: Goal: Level of anxiety will decrease Outcome: Progressing   Problem: Elimination: Goal: Will not experience complications related to bowel motility Outcome: Progressing Goal: Will not experience complications related to urinary retention Outcome: Progressing   Problem: Pain Managment: Goal: General experience of comfort will improve Outcome: Progressing   Problem: Safety: Goal: Ability to remain free from injury will improve Outcome: Progressing   Problem: Skin Integrity: Goal: Risk for impaired skin integrity will decrease Outcome: Progressing   Problem: Education: Goal: Understanding of CV disease, CV risk reduction, and recovery process will improve Outcome: Progressing Goal: Individualized Educational Video(s) Outcome: Progressing   Problem: Activity: Goal: Ability to return to baseline activity level will improve Outcome: Progressing   Problem: Cardiovascular: Goal: Ability to achieve and maintain adequate cardiovascular perfusion  will improve Outcome: Progressing Goal: Vascular access site(s) Level 0-1 will be maintained Outcome: Progressing   Problem: Health Behavior/Discharge Planning: Goal: Ability to safely manage health-related needs after discharge will improve Outcome: Progressing   Problem: Activity: Goal: Ability to tolerate increased activity will improve Outcome: Progressing   Problem: Respiratory: Goal: Ability to maintain a clear airway and adequate ventilation will improve Outcome: Progressing   Problem: Role Relationship: Goal: Method of communication will improve Outcome: Progressing   Problem: Safety: Goal: Non-violent Restraint(s) Outcome: Progressing   

## 2021-11-29 NOTE — TOC CAGE-AID Note (Signed)
Transition of Care [facility]) - CAGE-AID Screening   Patient Details  Name: Frank Warner MRN: [id number] Date of Birth: 27-Aug-1980  Transition of Care [facility]) CM/SW Contact:    Pollie Friar, RN Phone Number: 11/29/2021, 2:40 PM   Clinical Narrative: Pt refused inpatient/ outpatient alcohol counseling resources   CAGE-AID Screening:    Have You Ever Felt You Ought to Cut Down on Your Drinking or Drug Use?: No Have People Annoyed You By Critizing Your Drinking Or Drug Use?: No Have You Felt Bad Or Guilty About Your Drinking Or Drug Use?: No Have You Ever Had a Drink or Used Drugs First Thing In The Morning to Steady Your Nerves or to Get Rid of a Hangover?: No CAGE-AID Score: 0  Substance Abuse Education Offered: Yes (refused)
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
## 2021-11-29 NOTE — Progress Notes (Signed)
Inpatient Rehabilitation Admissions Coordinator   I spoke with his Mom by phone and she confirms he is uninsured. I have contacted Shanon Rosser with [employer] to assist Mom with disablity and Medicaid applications. I await bed availability to admit to CIR once Mom confirms she can provide 24/7 care after discharge from [facility]. Mom to discuss with pt's ex girlfriend and let me know.  Danne Baxter, RN, MSN Rehab Admissions Coordinator [phone] 11/29/2021 12:27 PM

## 2021-11-29 NOTE — Discharge Summary (Shared)
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 Swallow whole. Start taking on: December 03, 2021   clopidogrel 75 MG tablet Commonly known as: PLAVIX Take 1 tablet (75 mg total) by mouth daily. Start taking on: December 03, 2021   docusate sodium 100 MG capsule Commonly known as: COLACE Take 1 capsule (100 mg total) by mouth 2 (two) times daily.   levETIRAcetam 750 MG tablet Commonly known as: KEPPRA Take 2 tablets (1,500 mg total) by mouth 2 (two) times daily.   ondansetron 4 MG/2ML Soln injection Commonly known as: ZOFRAN Inject 2 mLs (4 mg total) into the vein every 6 (six) hours as needed for nausea or vomiting.   pantoprazole 40 MG tablet Commonly known as: PROTONIX Take 1 tablet (40 mg total) by mouth daily. Start taking on: December 03, 2021   polyethylene glycol 17 g packet Commonly known as: MIRALAX / GLYCOLAX Take 17 g by mouth 2 (two) times daily.   rosuvastatin 40 MG tablet Commonly known as: CRESTOR Take 1 tablet (40 mg total) by mouth daily. Start taking on: December 03, 2021   senna-docusate 8.6-50 MG tablet Commonly known as: Senokot-S Take 1 tablet by mouth at bedtime as needed for mild constipation.        LABORATORY  STUDIES CBC    Component Value Date/Time   WBC 15.0 (H) 12/01/2021 0520   RBC 4.15 (L) 12/01/2021 0520   HGB 14.6 12/01/2021 0520   HCT 40.3 12/01/2021 0520   PLT 385 12/01/2021 0520   MCV 97.1 12/01/2021 0520   MCH 35.2 (H) 12/01/2021 0520   MCHC 36.2 (H) 12/01/2021 0520   RDW 14.4 12/01/2021 0520   LYMPHSABS 2.9 11/22/2021 0313   MONOABS 1.1 (H) 11/22/2021 0313   EOSABS 0.2 11/22/2021 0313   BASOSABS 0.0 11/22/2021 0313   CMP    Component Value Date/Time   NA 136 11/30/2021 0430   K 3.5 11/30/2021 0430   CL 105 11/30/2021 0430   CO2 22 11/30/2021 0430   GLUCOSE 95 11/30/2021 0430   BUN 16 11/30/2021 0430   CREATININE 1.06 11/30/2021 0430   CALCIUM 9.3 11/30/2021 0430   PROT 7.4 11/19/2021 1122   ALBUMIN 3.4 (L) 11/19/2021 1122   AST 43 (H) 11/19/2021 1122   ALT 21 11/19/2021 1122   ALKPHOS 70 11/19/2021 1122   BILITOT 1.0 11/19/2021 1122   GFRNONAA >60 11/30/2021 0430   GFRAA >60 08/23/2016 1110   COAGS Lab Results  Component Value Date   INR 1.0 11/19/2021   Lipid Panel    Component Value Date/Time   CHOL 244 (H) 11/20/2021 0547   TRIG 102 11/20/2021 0547  HDL 62 11/20/2021 0547   CHOLHDL 3.9 11/20/2021 0547   VLDL 20 11/20/2021 0547   LDLCALC 162 (H) 11/20/2021 0547   HgbA1C  Lab Results  Component Value Date   HGBA1C 5.6 11/20/2021   Urinalysis    Component Value Date/Time   COLORURINE YELLOW 11/28/2021 0801   APPEARANCEUR CLEAR 11/28/2021 0801   LABSPEC 1.015 11/28/2021 0801   PHURINE 6.0 11/28/2021 0801   GLUCOSEU NEGATIVE 11/28/2021 0801   HGBUR NEGATIVE 11/28/2021 0801   BILIRUBINUR NEGATIVE 11/28/2021 0801   KETONESUR NEGATIVE 11/28/2021 0801   PROTEINUR NEGATIVE 11/28/2021 0801   NITRITE NEGATIVE 11/28/2021 0801   LEUKOCYTESUR NEGATIVE 11/28/2021 0801   Urine Drug Screen     Component Value Date/Time   LABOPIA NONE DETECTED 11/19/2021 1655   COCAINSCRNUR NONE DETECTED 11/19/2021 1655   LABBENZ NONE DETECTED 11/19/2021 1655    AMPHETMU NONE DETECTED 11/19/2021 1655   THCU POSITIVE (A) 11/19/2021 1655   LABBARB NONE DETECTED 11/19/2021 1655    Alcohol Level    Component Value Date/Time   ETH <10 11/19/2021 1122     SIGNIFICANT DIAGNOSTIC STUDIES CT HEAD WO CONTRAST (5MM)  Result Date: 12/01/2021 CLINICAL DATA:  Stroke follow-up EXAM: CT HEAD WITHOUT CONTRAST TECHNIQUE: Contiguous axial images were obtained from the base of the skull through the vertex without intravenous contrast. RADIATION DOSE REDUCTION: This exam was performed according to the departmental dose-optimization program which includes automated exposure control, adjustment of the mA and/or kV according to Frank Warner size and/or use of iterative reconstruction technique. COMPARISON:  11/27/2021 FINDINGS: Brain: Right MCA distribution infarct affecting the insula and frontal cortex and the striatum with intervening white matter. Petechial intraventricular hemorrhage has subsided. Local swelling appears unchanged. Frank new abnormality. Frank hydrocephalus. Vascular: Prominent density of right MCA vessels could be from ictus or related to adjacent edematous brain. Skull: Normal. Negative for fracture or focal lesion. Sinuses/Orbits: Negative IMPRESSION: Recent right MCA distribution infarct with regressing hemorrhage. Frank evidence of progression. Electronically Signed   By: Jorje Guild M.D.   On: 12/01/2021 11:25   DG CHEST PORT 1 VIEW  Result Date: 11/30/2021 CLINICAL DATA:  Cough. EXAM: PORTABLE CHEST 1 VIEW COMPARISON:  Chest x-ray 11/27/2021 FINDINGS: Right upper extremity PICC terminates over the distal SVC. Lung volumes are low likely accentuating central pulmonary vascularity. The heart size and mediastinal contours are within normal limits. Both lungs are clear. The visualized skeletal structures are unremarkable. IMPRESSION: Frank active disease. Electronically Signed   By: Ronney Asters M.D.   On: 11/30/2021 19:20   VAS [place] LOWER EXTREMITY VENOUS  (DVT)  Result Date: 11/29/2021  Lower Venous DVT Study Frank Warner Name:  Frank Warner  Date of Exam:   11/29/2021 Medical Rec #: [id number]     Accession #:    [id number] Date of Birth: 1981/01/12      Frank Warner Gender: M Frank Warner Age:   41 years Exam Location:  [facility] Procedure:      VAS [place] LOWER EXTREMITY VENOUS (DVT) Referring Phys: Elwin Sleight DE [place] T[place] --------------------------------------------------------------------------------  Indications: Ongoing leukocytosis per MD.  Risk Factors: Recent CVA. Comparison Study: Prior negative LEV done 11/20/21 Performing Technologist: Sharion Dove RVS  Examination Guidelines: A complete evaluation includes B-mode imaging, spectral Doppler, color Doppler, and power Doppler as needed of all accessible portions of each vessel. Bilateral testing is considered an integral part of a complete examination. Limited examinations for reoccurring indications may be performed as noted. The reflux portion of the exam is performed with  the Frank Warner in reverse Trendelenburg.  +---------+---------------+---------+-----------+----------+--------------+ RIGHT    CompressibilityPhasicitySpontaneityPropertiesThrombus Aging +---------+---------------+---------+-----------+----------+--------------+ CFV      Full           Yes      Yes                                 +---------+---------------+---------+-----------+----------+--------------+ SFJ      Full                                                        +---------+---------------+---------+-----------+----------+--------------+ FV Prox  Full                                                        +---------+---------------+---------+-----------+----------+--------------+ FV Mid   Full                                                        +---------+---------------+---------+-----------+----------+--------------+ FV DistalFull                                                         +---------+---------------+---------+-----------+----------+--------------+ PFV      Full                                                        +---------+---------------+---------+-----------+----------+--------------+ POP      Full           Yes      Yes                                 +---------+---------------+---------+-----------+----------+--------------+ PTV      Full                                                        +---------+---------------+---------+-----------+----------+--------------+ PERO     Full                                                        +---------+---------------+---------+-----------+----------+--------------+ Gastroc  Full                                                        +---------+---------------+---------+-----------+----------+--------------+   +---------+---------------+---------+-----------+----------+--------------+  LEFT     CompressibilityPhasicitySpontaneityPropertiesThrombus Aging +---------+---------------+---------+-----------+----------+--------------+ CFV      Full           Yes      Yes                                 +---------+---------------+---------+-----------+----------+--------------+ SFJ      Full                                                        +---------+---------------+---------+-----------+----------+--------------+ FV Prox  Full                                                        +---------+---------------+---------+-----------+----------+--------------+ FV Mid   Full                                                        +---------+---------------+---------+-----------+----------+--------------+ FV DistalFull                                                        +---------+---------------+---------+-----------+----------+--------------+ PFV      Full                                                         +---------+---------------+---------+-----------+----------+--------------+ POP      Full           Yes      Yes                                 +---------+---------------+---------+-----------+----------+--------------+ PTV      Full                                                        +---------+---------------+---------+-----------+----------+--------------+ PERO     Full                                                        +---------+---------------+---------+-----------+----------+--------------+     Summary: BILATERAL: - Frank evidence of deep vein thrombosis seen in the lower extremities, bilaterally. -Frank evidence of popliteal cyst, bilaterally. RIGHT: - Findings appear essentially unchanged compared to previous examination.  LEFT: - Findings appear essentially unchanged compared to previous examination.  *  See table(s) above for measurements and observations. Electronically signed by Servando Snare MD on 11/29/2021 at 7:50:20 PM.    Final    ECHO TEE  Result Date: 11/27/2021    TRANSESOPHOGEAL ECHO REPORT   Frank Warner Name:   Frank Warner Date of Exam: 11/26/2021 Medical Rec #:  [id number]    Height:       72.0 in Accession #:    [id number]   Weight:       220.2 lb Date of Birth:  04-19-1980     BSA:          2.220 m Frank Warner Age:    83 years     BP:           130/73 mmHg Frank Warner Gender: M            HR:           102 bpm. Exam Location:  Inpatient Procedure: 2D Echo, Cardiac Doppler, Color Doppler and Saline Contrast Bubble            Study Indications:     Stroke  History:         Frank Warner has prior history of Echocardiogram examinations. Risk                  Factors:Current Smoker. CVA. Acute respiratory failure with                  hypoxia.  Sonographer:     Clayton Lefort RDCS (AE) Referring Phys:  [id number] Tami Lin [facility] Diagnosing Phys: Buford Dresser MD PROCEDURE: After discussion of the risks and benefits of a TEE, an informed consent was obtained from a family member. The  transesophogeal probe was passed without difficulty through the esophogus of the Frank Warner. Local oropharyngeal anesthetic was provided with Cetacaine. Sedation performed by different physician. The Frank Warner was monitored while under deep sedation. Anesthestetic sedation was provided intravenously by Anesthesiology: 263.54m of Propofol. Image quality was good. The Frank Warner developed Frank complications during the procedure. IMPRESSIONS  1. Left ventricular ejection fraction, by estimation, is 55 to 60%. The left ventricle has normal function.  2. Right ventricular systolic function is normal. The right ventricular size is normal.  3. Frank left atrial/left atrial appendage thrombus was detected.  4. The mitral valve is normal in structure. Frank evidence of mitral valve regurgitation. Frank evidence of mitral stenosis.  5. The aortic valve is tricuspid. Aortic valve regurgitation is not visualized. Frank aortic stenosis is present.  6. Agitated saline contrast bubble study was positive with shunting observed after >6 cardiac cycles suggestive of intrapulmonary shunting. Conclusion(s)/Recommendation(s): Frank intracardiac source of embolism. Frank evidence of intra-atrial shunt. Late positive bubble suggests non-cardiac shunt. FINDINGS  Left Ventricle: Left ventricular ejection fraction, by estimation, is 55 to 60%. The left ventricle has normal function. The left ventricular internal cavity size was normal in size. Right Ventricle: The right ventricular size is normal. Frank increase in right ventricular wall thickness. Right ventricular systolic function is normal. Left Atrium: Left atrial size was normal in size. Frank left atrial/left atrial appendage thrombus was detected. Right Atrium: Right atrial size was normal in size. Pericardium: There is Frank evidence of pericardial effusion. Mitral Valve: The mitral valve is normal in structure. Frank evidence of mitral valve regurgitation. Frank evidence of mitral valve stenosis. There is Frank evidence of  mitral valve vegetation. Tricuspid Valve: The tricuspid valve is normal in structure. Tricuspid valve regurgitation is trivial. Frank evidence of tricuspid stenosis. There is  Frank evidence of tricuspid valve vegetation. Aortic Valve: The aortic valve is tricuspid. Aortic valve regurgitation is not visualized. Frank aortic stenosis is present. There is Frank evidence of aortic valve vegetation. Pulmonic Valve: The pulmonic valve was grossly normal. Pulmonic valve regurgitation is not visualized. Frank evidence of pulmonic stenosis. There is Frank evidence of pulmonic valve vegetation. Aorta: The aortic root and ascending aorta are structurally normal, with Frank evidence of dilitation. IAS/Shunts: Frank atrial level shunt detected by color flow Doppler. Agitated saline contrast was given intravenously to evaluate for intracardiac shunting. Agitated saline contrast bubble study was positive with shunting observed after >6 cardiac cycles suggestive of intrapulmonary shunting. Buford Dresser MD Electronically signed by Buford Dresser MD Signature Date/Time: 11/27/2021/1:46:49 PM    Final    DG CHEST PORT 1 VIEW  Result Date: 11/27/2021 CLINICAL DATA:  Leukocytosis EXAM: PORTABLE CHEST 1 VIEW COMPARISON:  11/22/2021 FINDINGS: There is interval removal of endotracheal tube and enteric tube. Tip of PICC line is seen at the junction of superior vena cava and right atrium. There is poor inspiration. Transverse diameter of heart is increased. There are Frank signs of alveolar pulmonary edema or focal pulmonary consolidation. There is improvement in the aeration of left lower lung field. IMPRESSION: Poor inspiration. There are Frank signs of pulmonary edema or focal pulmonary consolidation. Electronically Signed   By: Elmer Picker M.D.   On: 11/27/2021 13:22   CT HEAD WO CONTRAST (5MM)  Result Date: 11/27/2021 CLINICAL DATA:  Seizure and stroke EXAM: CT HEAD WITHOUT CONTRAST TECHNIQUE: Contiguous axial images were obtained from  the base of the skull through the vertex without intravenous contrast. RADIATION DOSE REDUCTION: This exam was performed according to the departmental dose-optimization program which includes automated exposure control, adjustment of the mA and/or kV according to Frank Warner size and/or use of iterative reconstruction technique. COMPARISON:  11/23/2021 FINDINGS: Brain: Right MCA distribution infarct with hemorrhage at the basal ganglia and frontal horn of the right lateral ventricle, non progressed. Local swelling appears similar to prior. Frank hydrocephalus. Vascular: High-density vessels along the surface of the infarcted right frontal lobe. Frank interval change Skull: Unremarkable Sinuses/Orbits: Negative IMPRESSION: Recent right MCA territory infarct without progression of hemorrhage. Frank evidence of interval infarction. Local intraventricular clot without hydrocephalus. Electronically Signed   By: Jorje Guild M.D.   On: 11/27/2021 06:33   IR PERCUTANEOUS ART THROMBECTOMY/INFUSION INTRACRANIAL INC DIAG ANGIO  Result Date: 11/24/2021 INDICATION: 41 year old male presenting to with seizures inflated noted to have right gaze preference and left-sided weakness; NIHSS 20. Head CT showed Frank acute process. 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 He was transferred to our service for emergency mechanical thrombectomy. EXAM: ULTRASOUND-GUIDED VASCULAR ACCESS DIAGNOSTIC CEREBRAL ANGIOGRAM MECHANICAL THROMBECTOMY FLAT PANEL HEAD CT COMPARISON:  MRI/MRA November 19, 2021; CT November 19, 2021. MEDICATIONS: Frank antibiotic administered. ANESTHESIA/SEDATION: The procedure was performed under general anesthesia. CONTRAST:  75 mL of Omnipaque 300 milligram/mL FLUOROSCOPY: Radiation Exposure Index (as provided by the fluoroscopic device): 222.9 mGy Kerma  COMPLICATIONS: None immediate. TECHNIQUE: Informed written consent was obtained from the Frank Warner's family after a thorough discussion of the procedural risks, benefits and alternatives. All questions were addressed. Maximal Sterile Barrier Technique was utilized including caps, mask, sterile gowns, sterile gloves, sterile drape, hand hygiene and skin antiseptic. A timeout was performed prior to the initiation  of the procedure. The right groin was prepped and draped in the usual sterile fashion. Using a micropuncture kit and the modified Seldinger technique, access was gained to the right common femoral artery and an 8 French sheath was placed. Real-time ultrasound guidance was utilized for vascular access including the acquisition of a permanent ultrasound image documenting patency of the accessed vessel. Under fluoroscopy, a Zoom 88 guide catheter was navigated over a 6 [place] Berenstein 2 catheter and a 0.035" Terumo Glidewire into the aortic arch. The catheter was placed into the right common carotid artery and then advanced into the right internal carotid artery. The diagnostic catheter was removed. Frontal and lateral angiograms of the head were obtained. FINDINGS: 1. Normal caliber of the right common femoral artery, adequate for vascular access. 2. Increased tortuosity of the upper cervical segment of the right ICA without significant stenosis. 3. Occlusion of the supraclinoid right ICA just distal to the anterior choroidal artery. Nonocclusive clot extends to the level of the posterior communicating artery. PROCEDURE: Using biplane roadmap, a zoom 71 aspiration catheter was navigated over Colossus 35 microguidewire into the cavernous segment of the right ICA. The aspiration catheter was then advanced to the level of occlusion and connected to an aspiration pump. Continuous aspiration was performed for 2 minutes. The guide catheter was connected to a VacLok syringe. The aspiration catheter was subsequently  removed under constant aspiration. The guide catheter was aspirated for debris. Right internal carotid artery angiogram showed recanalization of the intracranial right ICA and right M1 segment and proximal M2 posterior division branch with persistent occlusion of the proximal M2-anterior division and mid M2-posterior division segments. Using biplane roadmap guidance, a zoom 71 aspiration catheter was navigated over a Prowler EX microcatheter and a synchro select microguidewire into the cavernous segment of the right ICA. The microcatheter was then navigated over the wire into the right M2/MCA posterior division branch. Then, a 4 x 40 mm solitaire stent retriever was deployed spanning the M2 segment. The device was allowed to intercalated with the clot for 4 minutes. The microcatheter was removed. The aspiration catheter was advanced to the level of occlusion and connected to an aspiration pump. The thrombectomy device and aspiration catheter were removed under constant aspiration. Right internal carotid artery angiogram showed recanalization of a posterior M2 branch. Persistent occlusion of the anterior division and a second posterior division branch were noted. Using biplane roadmap guidance, a zoom 71 aspiration catheter was navigated over a Prowler EX microcatheter and a synchro select microguidewire into the cavernous segment of the right ICA. The microcatheter was then navigated over the wire into the right M2/MCA posterior division branch. Then, a 4 x 40 mm solitaire stent retriever was deployed spanning the M2 segment. The device was allowed to intercalated with the clot for 4 minutes. The microcatheter was removed. The aspiration catheter was advanced to the level of occlusion and connected to an aspiration pump. The thrombectomy device and aspiration catheter were removed under constant aspiration. Right internal carotid artery angiogram showed recanalization of all posterior division branches. Persistent  occlusion of the anterior division noted. Using biplane roadmap guidance, a zoom 71 aspiration catheter was navigated over a Prowler EX microcatheter and a synchro select microguidewire into the cavernous segment of the right ICA. The microcatheter was then navigated over the wire into the right M2/MCA anterior division branch. Then, a 4 x 40 mm solitaire stent retriever was deployed spanning the M2 segment. The device was allowed to intercalated with the clot  for 4 minutes. The microcatheter was removed. The aspiration catheter was advanced to the level of occlusion and connected to an aspiration pump. The thrombectomy device and aspiration catheter were removed under constant aspiration. Right internal carotid artery angiograms with frontal and lateral views of the head showed recanalization of the proximal right M2/MCA anterior division branch with distal occlusion. Using biplane roadmap guidance, a zoom 71 aspiration catheter was navigated over a Trak 21 microcatheter and a synchro select microguidewire into the cavernous segment of the right ICA. The microcatheter was then navigated over the wire into the right M2/MCA anterior division branch. Then, a 3 mm trevo stent retriever was deployed spanning the M2 segment. The device was allowed to intercalated with the clot for 4 minutes. The microcatheter was removed. The aspiration catheter was advanced to the level of occlusion and connected to an aspiration pump. The thrombectomy device and aspiration catheter were removed under constant aspiration. Right internal carotid artery angiograms showed recanalization of the right MCA vascular tree with slow flow in a posterior division branch to the posterior parietal area (TICI 2C). Flat panel CT of the head was obtained and post processed in a separate workstation with concurrent attending physician supervision. Selected images were sent to PACS. Contrast staining of the right basal ganglia is noted. Frank procedure  related complication. The catheter was subsequently withdrawn. Right common femoral artery angiogram was obtained in right anterior oblique view. The puncture is at the level of the common femoral artery. The artery has normal caliber, adequate for closure device. The sheath was exchanged over the wire for a Perclose prostyle which was utilized for access closure. Immediate hemostasis was achieved. IMPRESSION: 1. Successful mechanical thrombectomy for treatment of a right ICA terminus occlusion extending into the right MCA. Complete recanalization achieved after multiple passes. 2. Frank procedure related complication. However, prominent contrast staining is seen in the basal ganglia, suggesting ongoing ischemia. PLAN: Frank Warner transferred to ICU for continued care. Electronically Signed   By: Pedro Earls M.D.   On: 11/24/2021 12:19   IR CT Head Ltd  Result Date: 11/24/2021 INDICATION: 41 year old male presenting to with seizures inflated noted to have right gaze preference and left-sided weakness; NIHSS 20. Head CT showed Frank acute process. 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 He was transferred to our service for emergency mechanical thrombectomy. EXAM: ULTRASOUND-GUIDED VASCULAR ACCESS DIAGNOSTIC CEREBRAL ANGIOGRAM MECHANICAL THROMBECTOMY FLAT PANEL HEAD CT COMPARISON:  MRI/MRA November 19, 2021; CT November 19, 2021. MEDICATIONS: Frank antibiotic administered. ANESTHESIA/SEDATION: The procedure was performed under general anesthesia. CONTRAST:  75 mL of Omnipaque 300 milligram/mL FLUOROSCOPY: Radiation Exposure Index (as provided by the fluoroscopic device): 545.6 mGy Kerma COMPLICATIONS: None immediate. TECHNIQUE: Informed written consent was obtained from the Frank Warner's family after a thorough discussion of the procedural  risks, benefits and alternatives. All questions were addressed. Maximal Sterile Barrier Technique was utilized including caps, mask, sterile gowns, sterile gloves, sterile drape, hand hygiene and skin antiseptic. A timeout was performed prior to the initiation of the procedure. The right groin was prepped and draped in the usual sterile fashion. Using a micropuncture kit and the modified Seldinger technique, access was gained to the right common femoral artery and an 8 French sheath was placed. Real-time ultrasound guidance was utilized for vascular access including the acquisition of a permanent ultrasound  image documenting patency of the accessed vessel. Under fluoroscopy, a Zoom 88 guide catheter was navigated over a 6 [place] Berenstein 2 catheter and a 0.035" Terumo Glidewire into the aortic arch. The catheter was placed into the right common carotid artery and then advanced into the right internal carotid artery. The diagnostic catheter was removed. Frontal and lateral angiograms of the head were obtained. FINDINGS: 1. Normal caliber of the right common femoral artery, adequate for vascular access. 2. Increased tortuosity of the upper cervical segment of the right ICA without significant stenosis. 3. Occlusion of the supraclinoid right ICA just distal to the anterior choroidal artery. Nonocclusive clot extends to the level of the posterior communicating artery. PROCEDURE: Using biplane roadmap, a zoom 71 aspiration catheter was navigated over Colossus 35 microguidewire into the cavernous segment of the right ICA. The aspiration catheter was then advanced to the level of occlusion and connected to an aspiration pump. Continuous aspiration was performed for 2 minutes. The guide catheter was connected to a VacLok syringe. The aspiration catheter was subsequently removed under constant aspiration. The guide catheter was aspirated for debris. Right internal carotid artery angiogram showed recanalization of the  intracranial right ICA and right M1 segment and proximal M2 posterior division branch with persistent occlusion of the proximal M2-anterior division and mid M2-posterior division segments. Using biplane roadmap guidance, a zoom 71 aspiration catheter was navigated over a Prowler EX microcatheter and a synchro select microguidewire into the cavernous segment of the right ICA. The microcatheter was then navigated over the wire into the right M2/MCA posterior division branch. Then, a 4 x 40 mm solitaire stent retriever was deployed spanning the M2 segment. The device was allowed to intercalated with the clot for 4 minutes. The microcatheter was removed. The aspiration catheter was advanced to the level of occlusion and connected to an aspiration pump. The thrombectomy device and aspiration catheter were removed under constant aspiration. Right internal carotid artery angiogram showed recanalization of a posterior M2 branch. Persistent occlusion of the anterior division and a second posterior division branch were noted. Using biplane roadmap guidance, a zoom 71 aspiration catheter was navigated over a Prowler EX microcatheter and a synchro select microguidewire into the cavernous segment of the right ICA. The microcatheter was then navigated over the wire into the right M2/MCA posterior division branch. Then, a 4 x 40 mm solitaire stent retriever was deployed spanning the M2 segment. The device was allowed to intercalated with the clot for 4 minutes. The microcatheter was removed. The aspiration catheter was advanced to the level of occlusion and connected to an aspiration pump. The thrombectomy device and aspiration catheter were removed under constant aspiration. Right internal carotid artery angiogram showed recanalization of all posterior division branches. Persistent occlusion of the anterior division noted. Using biplane roadmap guidance, a zoom 71 aspiration catheter was navigated over a Prowler EX microcatheter  and a synchro select microguidewire into the cavernous segment of the right ICA. The microcatheter was then navigated over the wire into the right M2/MCA anterior division branch. Then, a 4 x 40 mm solitaire stent retriever was deployed spanning the M2 segment. The device was allowed to intercalated with the clot for 4 minutes. The microcatheter was removed. The aspiration catheter was advanced to the level of occlusion and connected to an aspiration pump. The thrombectomy device and aspiration catheter were removed under constant aspiration. Right internal carotid artery angiograms with frontal and lateral views of the head showed recanalization of the proximal right M2/MCA anterior  division branch with distal occlusion. Using biplane roadmap guidance, a zoom 71 aspiration catheter was navigated over a Trak 21 microcatheter and a synchro select microguidewire into the cavernous segment of the right ICA. The microcatheter was then navigated over the wire into the right M2/MCA anterior division branch. Then, a 3 mm trevo stent retriever was deployed spanning the M2 segment. The device was allowed to intercalated with the clot for 4 minutes. The microcatheter was removed. The aspiration catheter was advanced to the level of occlusion and connected to an aspiration pump. The thrombectomy device and aspiration catheter were removed under constant aspiration. Right internal carotid artery angiograms showed recanalization of the right MCA vascular tree with slow flow in a posterior division branch to the posterior parietal area (TICI 2C). Flat panel CT of the head was obtained and post processed in a separate workstation with concurrent attending physician supervision. Selected images were sent to PACS. Contrast staining of the right basal ganglia is noted. Frank procedure related complication. The catheter was subsequently withdrawn. Right common femoral artery angiogram was obtained in right anterior oblique view. The  puncture is at the level of the common femoral artery. The artery has normal caliber, adequate for closure device. The sheath was exchanged over the wire for a Perclose prostyle which was utilized for access closure. Immediate hemostasis was achieved. IMPRESSION: 1. Successful mechanical thrombectomy for treatment of a right ICA terminus occlusion extending into the right MCA. Complete recanalization achieved after multiple passes. 2. Frank procedure related complication. However, prominent contrast staining is seen in the basal ganglia, suggesting ongoing ischemia. PLAN: Frank Warner transferred to ICU for continued care. Electronically Signed   By: Pedro Earls M.D.   On: 11/24/2021 12:19   IR US Guide Vasc Access Right  Result Date: 11/24/2021 INDICATION: 41 year old male presenting to with seizures inflated noted to have right gaze preference and left-sided weakness; NIHSS 20. Head CT showed Frank acute process. 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 He was transferred to our service for emergency mechanical thrombectomy. EXAM: ULTRASOUND-GUIDED VASCULAR ACCESS DIAGNOSTIC CEREBRAL ANGIOGRAM MECHANICAL THROMBECTOMY FLAT PANEL HEAD CT COMPARISON:  MRI/MRA November 19, 2021; CT November 19, 2021. MEDICATIONS: Frank antibiotic administered. ANESTHESIA/SEDATION: The procedure was performed under general anesthesia. CONTRAST:  75 mL of Omnipaque 300 milligram/mL FLUOROSCOPY: Radiation Exposure Index (as provided by the fluoroscopic device): 810.1 mGy Kerma COMPLICATIONS: None immediate. TECHNIQUE: Informed written consent was obtained from the Frank Warner's family after a thorough discussion of the procedural risks, benefits and alternatives. All questions were addressed. Maximal Sterile Barrier Technique was utilized including caps, mask,  sterile gowns, sterile gloves, sterile drape, hand hygiene and skin antiseptic. A timeout was performed prior to the initiation of the procedure. The right groin was prepped and draped in the usual sterile fashion. Using a micropuncture kit and the modified Seldinger technique, access was gained to the right common femoral artery and an 8 French sheath was placed. Real-time ultrasound guidance was utilized for vascular access including the acquisition of a permanent ultrasound image documenting patency of the accessed vessel. Under fluoroscopy, a Zoom 88 guide catheter was navigated over a 6 [place] Berenstein 2 catheter and a 0.035" Terumo Glidewire into the aortic arch. The catheter was placed into the right common carotid artery and then advanced into the right internal carotid artery. The diagnostic  catheter was removed. Frontal and lateral angiograms of the head were obtained. FINDINGS: 1. Normal caliber of the right common femoral artery, adequate for vascular access. 2. Increased tortuosity of the upper cervical segment of the right ICA without significant stenosis. 3. Occlusion of the supraclinoid right ICA just distal to the anterior choroidal artery. Nonocclusive clot extends to the level of the posterior communicating artery. PROCEDURE: Using biplane roadmap, a zoom 71 aspiration catheter was navigated over Colossus 35 microguidewire into the cavernous segment of the right ICA. The aspiration catheter was then advanced to the level of occlusion and connected to an aspiration pump. Continuous aspiration was performed for 2 minutes. The guide catheter was connected to a VacLok syringe. The aspiration catheter was subsequently removed under constant aspiration. The guide catheter was aspirated for debris. Right internal carotid artery angiogram showed recanalization of the intracranial right ICA and right M1 segment and proximal M2 posterior division branch with persistent occlusion of the proximal  M2-anterior division and mid M2-posterior division segments. Using biplane roadmap guidance, a zoom 71 aspiration catheter was navigated over a Prowler EX microcatheter and a synchro select microguidewire into the cavernous segment of the right ICA. The microcatheter was then navigated over the wire into the right M2/MCA posterior division branch. Then, a 4 x 40 mm solitaire stent retriever was deployed spanning the M2 segment. The device was allowed to intercalated with the clot for 4 minutes. The microcatheter was removed. The aspiration catheter was advanced to the level of occlusion and connected to an aspiration pump. The thrombectomy device and aspiration catheter were removed under constant aspiration. Right internal carotid artery angiogram showed recanalization of a posterior M2 branch. Persistent occlusion of the anterior division and a second posterior division branch were noted. Using biplane roadmap guidance, a zoom 71 aspiration catheter was navigated over a Prowler EX microcatheter and a synchro select microguidewire into the cavernous segment of the right ICA. The microcatheter was then navigated over the wire into the right M2/MCA posterior division branch. Then, a 4 x 40 mm solitaire stent retriever was deployed spanning the M2 segment. The device was allowed to intercalated with the clot for 4 minutes. The microcatheter was removed. The aspiration catheter was advanced to the level of occlusion and connected to an aspiration pump. The thrombectomy device and aspiration catheter were removed under constant aspiration. Right internal carotid artery angiogram showed recanalization of all posterior division branches. Persistent occlusion of the anterior division noted. Using biplane roadmap guidance, a zoom 71 aspiration catheter was navigated over a Prowler EX microcatheter and a synchro select microguidewire into the cavernous segment of the right ICA. The microcatheter was then navigated over the  wire into the right M2/MCA anterior division branch. Then, a 4 x 40 mm solitaire stent retriever was deployed spanning the M2 segment. The device was allowed to intercalated with the clot for 4 minutes. The microcatheter was removed. The aspiration catheter was advanced to the level of occlusion and connected to an aspiration pump. The thrombectomy device and aspiration catheter were removed under constant aspiration. Right internal carotid artery angiograms with frontal and lateral views of the head showed recanalization of the proximal right M2/MCA anterior division branch with distal occlusion. Using biplane roadmap guidance, a zoom 71 aspiration catheter was navigated over a Trak 21 microcatheter and a synchro select microguidewire into the cavernous segment of the right ICA. The microcatheter was then navigated over the wire into the right M2/MCA anterior division branch. Then, a 3 mm  trevo stent retriever was deployed spanning the M2 segment. The device was allowed to intercalated with the clot for 4 minutes. The microcatheter was removed. The aspiration catheter was advanced to the level of occlusion and connected to an aspiration pump. The thrombectomy device and aspiration catheter were removed under constant aspiration. Right internal carotid artery angiograms showed recanalization of the right MCA vascular tree with slow flow in a posterior division branch to the posterior parietal area (TICI 2C). Flat panel CT of the head was obtained and post processed in a separate workstation with concurrent attending physician supervision. Selected images were sent to PACS. Contrast staining of the right basal ganglia is noted. Frank procedure related complication. The catheter was subsequently withdrawn. Right common femoral artery angiogram was obtained in right anterior oblique view. The puncture is at the level of the common femoral artery. The artery has normal caliber, adequate for closure device. The sheath was  exchanged over the wire for a Perclose prostyle which was utilized for access closure. Immediate hemostasis was achieved. IMPRESSION: 1. Successful mechanical thrombectomy for treatment of a right ICA terminus occlusion extending into the right MCA. Complete recanalization achieved after multiple passes. 2. Frank procedure related complication. However, prominent contrast staining is seen in the basal ganglia, suggesting ongoing ischemia. PLAN: Frank Warner transferred to ICU for continued care. Electronically Signed   By: Pedro Earls M.D.   On: 11/24/2021 12:19   CT HEAD WO CONTRAST (5MM)  Result Date: 11/23/2021 CLINICAL DATA:  Stroke follow-up EXAM: CT HEAD WITHOUT CONTRAST TECHNIQUE: Contiguous axial images were obtained from the base of the skull through the vertex without intravenous contrast. RADIATION DOSE REDUCTION: This exam was performed according to the departmental dose-optimization program which includes automated exposure control, adjustment of the mA and/or kV according to Frank Warner size and/or use of iterative reconstruction technique. COMPARISON:  11/21/2021 CT head FINDINGS: Brain: Redemonstrated hyperdense blood products in the right MCA territory, primarily in the right basal ganglia and anterior insula, similar to the prior exam (Heidelberg classification 1C). Redemonstrated surrounding hypodensity, likely edema, which appears similar to the prior exam. Overall unchanged small volume of intraventricular hemorrhage in the right frontal horn. Approximately 5 mm of right-to-left midline shift, unchanged. Unchanged narrowing of the right lateral ventricle. Frank evidence of ventriculomegaly. The basilar cisterns are patent. Frank new area of hemorrhage or acute infarct. Vascular: Frank definite hyperdense vessel. Skull: Normal. Negative for fracture or focal lesion. Sinuses/Orbits: Mucous retention cysts in the left-greater-than-right maxillary sinuses. Mild mucosal thickening in the ethmoid air  cells. The orbits are unremarkable. Other: The mastoids are well aerated. IMPRESSION: Redemonstrated right MCA territory infarct with Heidelberg classification 1C hemorrhagic transformation and overall unchanged small amount of intraventricular extension, surrounding edema, and 5 mm of right-to-left midline shift. Frank hydrocephalus. Patent basilar cisterns. Electronically Signed   By: Merilyn Baba M.D.   On: 11/23/2021 21:41   DG Chest Port 1 View  Result Date: 11/22/2021 CLINICAL DATA:  [id number] with ventilator dependent respiratory failure. EXAM: PORTABLE CHEST 1 VIEW COMPARISON:  11/19/2021 FINDINGS: 4:16 a.m. ETT interval pullback to 5.3 cm from carina. NGT is looped around the gastric fundus with stable positioning. Right PICC interval insertion with the tip about the superior cavoatrial junction. Frank pneumothorax. Patchy left lower lobe consolidation continues to be seen with a small left pleural effusion. The remaining lungs are clear. There is mild cardiomegaly and mild central vascular prominence without visible edema. Stable mediastinum. IMPRESSION: Patchy consolidation left lower lobe and small  left pleural effusion with Frank interval improvement. Likely due to pneumonia or aspiration. Cardiomegaly with central vascular prominence without overt edema. Right PICC tip about the superior cavoatrial junction. Electronically Signed   By: Telford Nab M.D.   On: 11/22/2021 07:09   MR ANGIO HEAD WO CONTRAST  Result Date: 11/22/2021 CLINICAL DATA:  41 year old male presenting with seizure and fall, hyperdense right MCA. Distal right ICA occlusion, right MCA infarct. Status post endovascular revascularization on 11/19/2021. Subsequent encounter.  Brain MRI EXAM: MRA HEAD WITHOUT CONTRAST TECHNIQUE: Angiographic images of the Circle of Willis were acquired using MRA technique without intravenous contrast. COMPARISON:  Today reported separately. Pre treatment MRA 11/19/2021. FINDINGS: Anterior circulation:  Antegrade flow now in both distal ICAs and both ICA siphons. Some tortuosity of the right ICA below the skull base. Antegrade flow appears fairly symmetric. Frank siphon stenosis identified. Patent carotid termini, MCA and ACA origins without stenosis. Anterior communicating artery and visible ACA branches are within normal limits. Left MCA M1 segment and trifurcation appear patent without stenosis. Visible left MCA branches are within normal limits. Right MCA M1 segment and bifurcation our patent. Frank significant irregularity or stenosis. Visible right MCA branches are within normal limits. Posterior circulation: Stable antegrade flow in the posterior circulation. Fairly codominant distal vertebral arteries. Normal left PICA origin. Frank vertebrobasilar junction or basilar artery stenosis. SCA and PCA origins are patent. Right AICA appears dominant. Posterior communicating arteries are diminutive or absent. Bilateral PCA branches are within normal limits. Anatomic variants: None. Other: Right basal ganglia hemorrhage, intraventricular blood, and other findings detail on MRI today separately. IMPRESSION: 1. Restored patency of the distal Right ICA and Right MCA with Frank adverse features identified. 2. Otherwise negative intracranial MRA. 3. See MRI Brain today reported separately. Electronically Signed   By: Genevie Ann M.D.   On: 11/22/2021 06:38   MR BRAIN WO CONTRAST  Result Date: 11/22/2021 CLINICAL DATA:  41 year old male presenting with seizure and fall, hyperdense right MCA. Distal right ICA occlusion, right MCA infarct. Status post endovascular revascularization on 11/19/2021. Subsequent encounter. EXAM: MRI HEAD WITHOUT CONTRAST TECHNIQUE: Multiplanar, multiecho pulse sequences of the brain and surrounding structures were obtained without intravenous contrast. COMPARISON:  Pretreatment MRI and MRA 11/19/2021. Post treatment head CT 11/21/2021. FINDINGS: Brain: Confluent abnormal diffusion in the right MCA  territory, primarily the anterior and middle division areas with confluent petechial hemorrhage throughout the right basal ganglia, Heidelberg Classification 1C. And there are a few other scattered punctate foci of hemosiderin in the right hemisphere such as series 14, image 33. Cytotoxic edema. Subsequent mass effect with leftward midline shift of 5 mm is stable from the head CT yesterday. Stable intraventricular extension, small to moderate volume most pronounced in the right occipital horn (series 11, image 13). Mass effect on the right lateral ventricle. Frank ventriculomegaly. Basilar cisterns remain patent. Several other small foci of restricted diffusion in the right hemisphere including at the posterior right MCA/PCA watershed area lateral occipital lobe series 5, image 77. There is also a solitary small left anterior frontal lobe cortical infarct on series 5, image 85. Frank posterior fossa restricted diffusion. Outside of those acute findings gray and white matter signal appears normal. Negative cervicomedullary junction and pituitary. Vascular: Major intracranial vascular flow voids are preserved. Skull and upper cervical spine: Negative. Sinuses/Orbits: Negative orbits. Mild paranasal sinus mucosal thickening. Other: Intubated, fluid in the pharynx.  Trace mastoid fluid. IMPRESSION: 1. Stable Right MCA infarct with Heidelberg Classification 1C hemorrhage in the  right basal ganglia since the CT yesterday. Stable intracranial mass effect with 5 mm of leftward midline shift. Basilar cisterns remain patent. 2. Stable small volume IVH. Frank ventriculomegaly. 3. Occasional other small foci of infarction in the left ACA and right MCA/PCA watershed areas. Electronically Signed   By: Genevie Ann M.D.   On: 11/22/2021 06:31   VAS [place] TRANSCRANIAL DOPPLER W BUBBLES  Result Date: 11/21/2021  Transcranial Doppler with Bubble Frank Warner Name:  Frank Warner  Date of Exam:   11/20/2021 Medical Rec #: [id number]     Accession #:     [id number] Date of Birth: 10-20-1980      Frank Warner Gender: M Frank Warner Age:   72 years Exam Location:  [facility] [place] Procedure:      VAS [place] TRANSCRANIAL [place] Referring Phys: Janine Ores --------------------------------------------------------------------------------  Indications: Stroke. History: Acute infarct in the right MCA territory, particularly the right basal ganglia, insula, anterior temporal lobe, and anterior inferior frontal lobe, secondary to right ICA occlusion. Comparison Study: Frank prior study Performing Technologist: Sharion Dove RVS  Examination Guidelines: A complete evaluation includes B-mode imaging, spectral Doppler, color Doppler, and power Doppler as needed of all accessible portions of each vessel. Bilateral testing is considered an integral part of a complete examination. Limited examinations for reoccurring indications may be performed as noted.  Summary: Frank HITS at rest or during Valsalva. Negative transcranial Doppler Bubble study with Frank evidence of right to left intracardiac communication.  A vascular evaluation was performed. The left middle cerebral artery was studied. An IV was inserted into the Frank Warner's right forearm. Verbal informed consent was obtained.  Negative TCD Bubble study without evidence of right to left shunt noted. *See table(s) above for TCD measurements and observations.  Diagnosing physician: Antony Contras MD Electronically signed by Antony Contras MD on 11/21/2021 at 3:02:47 PM.    Final    [place] EKG SITE RITE  Result Date: 11/21/2021 If Site Rite image not attached, placement could not be confirmed due to current cardiac rhythm.  VAS [place] LOWER EXTREMITY VENOUS (DVT)  Result Date: 11/21/2021  Lower Venous DVT Study Frank Warner Name:  Frank Warner  Date of Exam:   11/20/2021 Medical Rec #: [id number]     Accession #:    [id number] Date of Birth: 1980/07/20      Frank Warner Gender: M Frank Warner Age:   39 years Exam Location:  [facility] Procedure:      VAS  [place] LOWER EXTREMITY VENOUS (DVT) Referring Phys: Janine Ores --------------------------------------------------------------------------------  Indications: Stroke.  Risk Factors: None identified. Limitations: Poor ultrasound/tissue interface and bandages. Comparison Study: Frank prior studies. Performing Technologist: Oliver Hum RVT  Examination Guidelines: A complete evaluation includes B-mode imaging, spectral Doppler, color Doppler, and power Doppler as needed of all accessible portions of each vessel. Bilateral testing is considered an integral part of a complete examination. Limited examinations for reoccurring indications may be performed as noted. The reflux portion of the exam is performed with the Frank Warner in reverse Trendelenburg.  +---------+---------------+---------+-----------+----------+--------------+ RIGHT    CompressibilityPhasicitySpontaneityPropertiesThrombus Aging +---------+---------------+---------+-----------+----------+--------------+ CFV      Full           Yes      Yes                                 +---------+---------------+---------+-----------+----------+--------------+ SFJ      Full                                                        +---------+---------------+---------+-----------+----------+--------------+  FV Prox  Full                                                        +---------+---------------+---------+-----------+----------+--------------+ FV Mid   Full                                                        +---------+---------------+---------+-----------+----------+--------------+ FV DistalFull                                                        +---------+---------------+---------+-----------+----------+--------------+ PFV      Full                                                        +---------+---------------+---------+-----------+----------+--------------+ POP      Full           Yes      Yes                                  +---------+---------------+---------+-----------+----------+--------------+ PTV      Full                                                        +---------+---------------+---------+-----------+----------+--------------+ PERO     Full                                                        +---------+---------------+---------+-----------+----------+--------------+   +---------+---------------+---------+-----------+----------+--------------+ LEFT     CompressibilityPhasicitySpontaneityPropertiesThrombus Aging +---------+---------------+---------+-----------+----------+--------------+ CFV      Full           Yes      Yes                                 +---------+---------------+---------+-----------+----------+--------------+ SFJ      Full                                                        +---------+---------------+---------+-----------+----------+--------------+ FV Prox  Full                                                        +---------+---------------+---------+-----------+----------+--------------+  FV Mid   Full                                                        +---------+---------------+---------+-----------+----------+--------------+ FV DistalFull           Yes      Yes                                 +---------+---------------+---------+-----------+----------+--------------+ PFV      Full                                                        +---------+---------------+---------+-----------+----------+--------------+ POP      Full           Yes      Yes                                 +---------+---------------+---------+-----------+----------+--------------+ PTV      Full                                                        +---------+---------------+---------+-----------+----------+--------------+ PERO     Full                                                         +---------+---------------+---------+-----------+----------+--------------+     Summary: RIGHT: - There is Frank evidence of deep vein thrombosis in the lower extremity. However, portions of this examination were limited- see technologist comments above.  - Frank cystic structure found in the popliteal fossa.  LEFT: - There is Frank evidence of deep vein thrombosis in the lower extremity. However, portions of this examination were limited- see technologist comments above.  - Frank cystic structure found in the popliteal fossa.  *See table(s) above for measurements and observations. Electronically signed by Jamelle Haring on 11/21/2021 at 33:50:56 AM.    Final    CT HEAD WO CONTRAST (5MM)  Result Date: 11/21/2021 CLINICAL DATA:  41 year old male with right MCA infarct, right ICA ELVO. Status post endovascular revascularization on 11/19/2021. Subsequent encounter. EXAM: CT HEAD WITHOUT CONTRAST TECHNIQUE: Contiguous axial images were obtained from the base of the skull through the vertex without intravenous contrast. RADIATION DOSE REDUCTION: This exam was performed according to the departmental dose-optimization program which includes automated exposure control, adjustment of the mA and/or kV according to Frank Warner size and/or use of iterative reconstruction technique. COMPARISON:  Presentation head CT, MRI/MRA. FINDINGS: Brain: Heterogeneous cytotoxic edema with some hyperdense blood products in the right MCA territory, primarily the basal ganglia and anterior insula/operculum. Heidelberg Classification 1C blood. Small volume of intraventricular extension of hemorrhage. Mass effect with leftward midline shift of 5 mm. Partial effacement of the right lateral  ventricle. Frank ventriculomegaly. Basilar cisterns remain patent. Frank contralateral left hemisphere or posterior fossa cytotoxic edema identified. Frank other intracranial hemorrhage. Vascular: Regressed hyperdensity of the right MCA. Skull: Frank acute osseous abnormality identified.  Sinuses/Orbits: Visualized paranasal sinuses and mastoids are stable and well aerated. Other: Scalp EEG electrodes. Frank acute orbit or scalp soft tissue finding. IMPRESSION: 1. 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 Small volume of intraventricular extension. 2. Mass effect with 5 mm leftward midline shift. Frank ventriculomegaly. Basilar cisterns remain normal. Electronically Signed   By: Genevie Ann M.D.   On: 11/21/2021 09:48   VAS US CAROTID  Result Date: 11/20/2021 Carotid Arterial Duplex Study Frank Warner Name:  Frank Warner  Date of Exam:   11/20/2021 Medical Rec #: [id number]     Accession #:    [id number] Date of Birth: Nov 27, 1980      Frank Warner Gender: M Frank Warner Age:   32 years Exam Location:  [facility] Procedure:      VAS US CAROTID Referring Phys: Rosalin Hawking --------------------------------------------------------------------------------  Indications:       CVA. Risk Factors:      None. Limitations        Today's exam was limited due to Frank Warner on a ventilator, the                    Frank Warner's respiratory variation and Frank Warner constant                    movement. Comparison Study:  Frank prior studies. Performing Technologist: Oliver Hum RVT  Examination Guidelines: A complete evaluation includes B-mode imaging, spectral Doppler, color Doppler, and power Doppler as needed of all accessible portions of each vessel. Bilateral testing is considered an integral part of a complete examination. Limited examinations for reoccurring indications may be performed as noted.  Right Carotid Findings: +----------+--------+--------+--------+-----------------------+--------+           PSV cm/sEDV cm/sStenosisPlaque Description     Comments +----------+--------+--------+--------+-----------------------+--------+ CCA Prox  66      21              smooth and heterogenous         +----------+--------+--------+--------+-----------------------+--------+ CCA  Distal73      32              smooth and heterogenous         +----------+--------+--------+--------+-----------------------+--------+ ICA Prox  48      23              smooth and heterogenous         +----------+--------+--------+--------+-----------------------+--------+ ICA Distal88      41                                              +----------+--------+--------+--------+-----------------------+--------+ ECA       97      32                                              +----------+--------+--------+--------+-----------------------+--------+ +----------+--------+-------+--------+-------------------+           PSV cm/sEDV cmsDescribeArm Pressure (mmHG) +----------+--------+-------+--------+-------------------+ [id number]                                        +----------+--------+-------+--------+-------------------+ +---------+--------+--+--------+--+---------+  VertebralPSV cm/s47EDV cm/s15Antegrade +---------+--------+--+--------+--+---------+  Left Carotid Findings: +----------+--------+--------+--------+-----------------------+--------+           PSV cm/sEDV cm/sStenosisPlaque Description     Comments +----------+--------+--------+--------+-----------------------+--------+ CCA Prox  158     31              smooth and heterogenous         +----------+--------+--------+--------+-----------------------+--------+ CCA Distal84      27              smooth and heterogenous         +----------+--------+--------+--------+-----------------------+--------+ ICA Prox  59      28              smooth and heterogenous         +----------+--------+--------+--------+-----------------------+--------+ ICA Distal71      28                                              +----------+--------+--------+--------+-----------------------+--------+ ECA       98      31                                               +----------+--------+--------+--------+-----------------------+--------+ +----------+--------+--------+--------+-------------------+           PSV cm/sEDV cm/sDescribeArm Pressure (mmHG) +----------+--------+--------+--------+-------------------+ Subclavian113                                         +----------+--------+--------+--------+-------------------+ +---------+--------+--+--------+--+---------+ VertebralPSV cm/s68EDV cm/s24Antegrade +---------+--------+--+--------+--+---------+   Summary: Right Carotid: Velocities in the right ICA are consistent with a 1-39% stenosis. Left Carotid: Velocities in the left ICA are consistent with a 1-39% stenosis. Vertebrals: Bilateral vertebral arteries demonstrate antegrade flow. *See table(s) above for measurements and observations.  Electronically signed by Antony Contras MD on 11/20/2021 at 12:48:33 PM.    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 EEG to evaluate for seizure Level of alertness: comatose AEDs during EEG study: LEV, propofol Technical aspects: This EEG study was done with scalp electrodes positioned according to the 10-20 International system of electrode placement. Electrical activity was reviewed with band pass filter of 1-_0 , sensitivity of 7 uV/mm, display speed of 35m/sec with a _1  notched filter applied as appropriate. EEG data were recorded continuously and digitally stored.  Video monitoring was available and reviewed as appropriate. Description: EEG showed continuous generalized 3 to 6 Hz theta-delta slowing admixed with 15-_2  beta activity. The slowing was lower amplitude in  right temporo-parietal region. Hyperventilation and photic stimulation were not performed.   ABNORMALITY - Continuous slow, generalized and maximal right temporo-parietal region. IMPRESSION: This study is suggestive of cortical dysfunction arising from right temporo-parietal region likely secondary to underlying stroke. Additionally there is severe diffuse encephalopathy, nonspecific etiology but likely related to sedation. Frank seizures or epileptiform discharges were seen throughout the recording. Priyanka OBarbra Sarks  DG CHEST PORT 1 VIEW  Result Date: 11/19/2021 CLINICAL DATA:  Respiratory failure EXAM: PORTABLE CHEST 1 VIEW COMPARISON:  11/19/2021 FINDINGS: Endotracheal tube seen 4.3 cm above the carina. Nasogastric tube is looped within the gastric fundus. Lung volumes are small. Mild left basilar atelectasis or infiltrate has developed. Frank pneumothorax or pleural effusion. Cardiac size within normal limits. Pulmonary vascularity is normal. IMPRESSION: 1. Support tubes in appropriate position. 2. Pulmonary hypoinflation.  Probable left basilar atelectasis. Electronically Signed   By: Fidela Salisbury M.D.   On: 11/19/2021 19:24   ECHOCARDIOGRAM COMPLETE  Result Date: 11/19/2021    ECHOCARDIOGRAM REPORT   Frank Warner Name:   Frank Warner Date of Exam: 11/19/2021 Medical Rec #:  [id number]    Height:       72.0 in Accession #:    [id number]   Weight:       231.5 lb Date of Birth:  10/18/1980     BSA:          2.267 m Frank Warner Age:    43 years     BP:           130/83 mmHg Frank Warner Gender: M            HR:           61 bpm. Exam Location:  Inpatient Procedure: 2D Echo Indications:    stroke  History:        Frank Warner has Frank prior history of Echocardiogram examinations.  Sonographer:    Johny Chess RDCS Referring Phys: [phone] Felise Georgia A Raelene Trew  Sonographer Comments: Echo performed with Frank Warner supine and on artificial respirator. IMPRESSIONS  1. Left ventricular ejection fraction, by estimation, is 55 to 60%. The left  ventricle has normal function. The left ventricle has Frank regional wall motion abnormalities. Left ventricular diastolic parameters were normal.  2. Right ventricular systolic function is normal. The right ventricular size is normal.  3. The mitral valve is normal in structure. Frank evidence of mitral valve regurgitation. Frank evidence of mitral stenosis.  4. The aortic valve is normal in structure. Aortic valve regurgitation is not visualized. Frank aortic stenosis is present.  5. The inferior vena cava is normal in size with greater than 50% respiratory variability, suggesting right atrial pressure of 3 mmHg. FINDINGS  Left Ventricle: Left ventricular ejection fraction, by estimation, is 55 to 60%. The left ventricle has normal function. The left ventricle has Frank regional wall motion abnormalities. The left ventricular internal cavity size was normal in size. There is  Frank left ventricular hypertrophy. Left ventricular diastolic parameters were normal. Right Ventricle: The right ventricular size is normal. Frank increase in right ventricular wall thickness. Right ventricular systolic function is normal. Left Atrium: Left atrial size was normal in size. Right Atrium: Right atrial size was normal in size. Pericardium: There is Frank evidence of pericardial effusion. Mitral Valve: The mitral valve is normal in structure. Frank evidence of mitral valve regurgitation. Frank evidence of mitral valve stenosis. Tricuspid Valve: The tricuspid valve is normal in structure. Tricuspid valve regurgitation is trivial. Frank evidence of tricuspid stenosis. Aortic Valve: The aortic valve is normal in structure. Aortic valve regurgitation is not visualized. Frank aortic stenosis is present. Pulmonic Valve: The pulmonic valve was normal in structure. Pulmonic valve regurgitation is not visualized. Frank evidence of pulmonic stenosis. Aorta: The aortic root is normal in size and structure. Venous: The inferior vena cava is normal in size with greater than 50%  respiratory variability, suggesting right atrial  pressure of 3 mmHg. IAS/Shunts: Frank atrial level shunt detected by color flow Doppler.  LEFT VENTRICLE PLAX 2D LVIDd:         4.50 cm   Diastology LVIDs:         3.10 cm   LV e' medial:    10.70 cm/s LV PW:         1.00 cm   LV E/e' medial:  6.8 LV IVS:        1.20 cm   LV e' lateral:   13.70 cm/s LVOT diam:     2.30 cm   LV E/e' lateral: 5.3 LV SV:         80 LV SV Index:   35 LVOT Area:     4.15 cm  RIGHT VENTRICLE             IVC RV S prime:     10.70 cm/s  IVC diam: 1.70 cm TAPSE (M-mode): 2.2 cm LEFT ATRIUM             Index        RIGHT ATRIUM           Index LA diam:        3.30 cm 1.46 cm/m   RA Area:     16.90 cm LA Vol (A2C):   41.5 ml 18.31 ml/m  RA Volume:   44.30 ml  19.54 ml/m LA Vol (A4C):   40.5 ml 17.86 ml/m LA Biplane Vol: 41.6 ml 18.35 ml/m  AORTIC VALVE LVOT Vmax:   106.00 cm/s LVOT Vmean:  63.500 cm/s LVOT VTI:    0.192 m  AORTA Ao Root diam: 3.40 cm Ao Asc diam:  3.40 cm MITRAL VALVE MV Area (PHT): 3.77 cm    SHUNTS MV Decel Time: 201 msec    Systemic VTI:  0.19 m MV E velocity: 72.60 cm/s  Systemic Diam: 2.30 cm MV A velocity: 73.90 cm/s MV E/A ratio:  0.98 Glori Bickers MD Electronically signed by Glori Bickers MD Signature Date/Time: 11/19/2021/5:52:08 PM    Final    MR ANGIO HEAD WO CONTRAST  Result Date: 11/19/2021 CLINICAL DATA:  Stroke suspected EXAM: MRI HEAD WITHOUT CONTRAST MRA HEAD WITHOUT CONTRAST MRA NECK WITHOUT AND WITH CONTRAST TECHNIQUE: Limited MRI brain, consisting primarily of axial and coronal diffusion-weighted imaging, without intravenous contrast. Angiographic images of the Circle of Willis were acquired using MRA technique without intravenous contrast. Angiographic images of the neck were acquired using MRA technique without and with intravenous contrast. Carotid stenosis measurements (when applicable) are obtained utilizing NASCET criteria, using the distal internal carotid diameter as the denominator.  CONTRAST:  10 mL Vueway COMPARISON:  CT head 11/19/2021 FINDINGS: MRI HEAD FINDINGS Restricted diffusion with ADC correlate involving the right MCA territory, particularly the right basal ganglia, insula, anterior temporal lobe and anterior inferior frontal lobe (series 2, images 20-37). Frank additional acute process is seen on the limited sequences obtained. MRA HEAD FINDINGS Anterior circulation: Frank definite signal in the right intracranial ICA. The left internal carotid artery is patent to the terminus without significant stenosis. Patent left A1. Minimal signal seen in the right distal A1. Normal anterior communicating artery. Anterior cerebral arteries are patent to their distal aspects. Patent left A1 and MCA branches.  Frank signal in the right MCA. Posterior circulation: Vertebral arteries patent to the vertebrobasilar junction without stenosis. Basilar patent to its distal aspect. Superior cerebellar arteries patent proximally. Patent P1 segments. PCAs perfused to their distal aspects without stenosis.  The bilateral posterior communicating arteries are not visualized. Anatomic variants: None significant MRA NECK FINDINGS Aortic arch: Two-vessel arch with a common origin of the brachiocephalic and left common carotid arteries. Imaged portion shows Frank evidence of aneurysm or dissection. Frank significant stenosis of the major arch vessel origins. Right carotid system: Poor signal in the right common carotid artery on time-of-flight imaging, with signal seen in the right ECA and minimal signal in the proximal right ICA. On postcontrast imaging, there is contrast in the right carotid system, which diminishes in the mid ICA on the first pass (series 801, image 99). Better opacification is seen on the second pass (series 802, image 99), with abrupt cutoff of contrast in the right supraclinoid ICA (series 802, image 109). Limited imaging of the brain on postcontrast sequences shows some flow in distal right MCA branches  (series 802, image 40). Left carotid system: Frank evidence of stenosis, dissection, or occlusion. Vertebral arteries: Frank evidence of stenosis, dissection, or occlusion. Other: None IMPRESSION: 1. 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 2. On postcontrast MRA, there is abrupt cutoff of contrast in the right supraclinoid ICA. 3. On time-of-flight MRA of the brain, Frank flow signal is seen in the right intracranial ICA and MCA; however, contrast is seen in the more distal right MCA branches on the limited brain imaging on the contrasted neck MRA, suggesting collateral but diminished flow. These results were called by telephone at the time of interpretation on 11/19/2021 at 3:48 pm to provider [place] Warner , who verbally acknowledged these results. Electronically Signed   By: Merilyn Baba M.D.   On: 11/19/2021 15:49   MR ANGIO NECK W WO CONTRAST  Result Date: 11/19/2021 CLINICAL DATA:  Stroke suspected EXAM: MRI HEAD WITHOUT CONTRAST MRA HEAD WITHOUT CONTRAST MRA NECK WITHOUT AND WITH CONTRAST TECHNIQUE: Limited MRI brain, consisting primarily of axial and coronal diffusion-weighted imaging, without intravenous contrast. Angiographic images of the Circle of Willis were acquired using MRA technique without intravenous contrast. Angiographic images of the neck were acquired using MRA technique without and with intravenous contrast. Carotid stenosis measurements (when applicable) are obtained utilizing NASCET criteria, using the distal internal carotid diameter as the denominator. CONTRAST:  10 mL Vueway COMPARISON:  CT head 11/19/2021 FINDINGS: MRI HEAD FINDINGS Restricted diffusion with ADC correlate involving the right MCA territory, particularly the right basal ganglia, insula, anterior temporal lobe and anterior inferior frontal lobe (series 2, images 20-37). Frank additional acute  process is seen on the limited sequences obtained. MRA HEAD FINDINGS Anterior circulation: Frank definite signal in the right intracranial ICA. The left internal carotid artery is patent to the terminus without significant stenosis. Patent left A1. Minimal signal seen in the right distal A1. Normal anterior communicating artery. Anterior cerebral arteries are patent to their distal aspects. Patent left A1 and MCA branches.  Frank signal in the right MCA. Posterior circulation: Vertebral arteries patent to the vertebrobasilar junction without stenosis. Basilar patent to its distal aspect. Superior cerebellar arteries patent proximally. Patent P1 segments. PCAs perfused to their distal aspects without stenosis. The bilateral posterior communicating arteries are not visualized. Anatomic variants: None significant MRA NECK FINDINGS Aortic arch: Two-vessel arch with a common origin of the brachiocephalic and left common carotid arteries. Imaged portion shows Frank evidence of aneurysm or dissection. Frank significant stenosis of the major arch vessel origins. Right carotid system: Poor signal in  the right common carotid artery on time-of-flight imaging, with signal seen in the right ECA and minimal signal in the proximal right ICA. On postcontrast imaging, there is contrast in the right carotid system, which diminishes in the mid ICA on the first pass (series 801, image 99). Better opacification is seen on the second pass (series 802, image 99), with abrupt cutoff of contrast in the right supraclinoid ICA (series 802, image 109). Limited imaging of the brain on postcontrast sequences shows some flow in distal right MCA branches (series 802, image 40). Left carotid system: Frank evidence of stenosis, dissection, or occlusion. Vertebral arteries: Frank evidence of stenosis, dissection, or occlusion. Other: None IMPRESSION: 1. Acute infarct in the right MCA territory, particularly the right basal ganglia, insula, anterior temporal lobe, and  anterior inferior frontal lobe, secondary to right ICA occlusion. 2. On postcontrast MRA, there is abrupt cutoff of contrast in the right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

## 2021-11-29 NOTE — TOC Progression Note (Signed)
Transition of Care [facility]) - Progression Note    Patient Details  Name: Frank Warner MRN: [id number] Date of Birth: 1980-12-11  Transition of [facility]) CM/SW Contact  Pollie Friar, RN Phone Number: 11/29/2021, 2:41 PM  Clinical Narrative:    Pt interested in one of the [facility] clinics at d/c. Pt will need appointment and information on [facility] at d/c.  TOC following.   Expected Discharge Plan: IP Rehab Facility Barriers to Discharge: Continued Medical Work up  Expected Discharge Plan and Services Expected Discharge Plan: [place]   Discharge Planning Services: CM Consult Post Acute Care Choice: IP Rehab Living arrangements for the past 2 months: Single Family Home                                       Social Determinants of Health (SDOH) Interventions    Readmission Risk Interventions     No data to display
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
## 2021-11-30 ENCOUNTER — Inpatient Hospital Stay (HOSPITAL_COMMUNITY): Payer: Self-pay

## 2021-11-30 DIAGNOSIS — D72829 Elevated white blood cell count, unspecified: Secondary | ICD-10-CM

## 2021-11-30 LAB — BASIC METABOLIC PANEL
Anion gap: 9 (ref 5–15)
BUN: 16 mg/dL (ref 6–20)
CO2: 22 mmol/L (ref 22–32)
Calcium: 9.3 mg/dL (ref 8.9–10.3)
Chloride: 105 mmol/L (ref 98–111)
Creatinine, Ser: 1.06 mg/dL (ref 0.61–1.24)
GFR, Estimated: >60 mL/min (ref >60)
Glucose, Bld: 95 mg/dL (ref 70–99)
Potassium: 3.5 mmol/L (ref 3.5–5.1)
Sodium: 136 mmol/L (ref 135–145)

## 2021-11-30 LAB — CBC
HCT: 40.9 % (ref 39.0–52.0)
Hemoglobin: 14.2 g/dL (ref 13.0–17.0)
MCH: 34 pg (ref 26.0–34.0)
MCHC: 34.7 g/dL (ref 30.0–36.0)
MCV: 97.8 fL (ref 80.0–100.0)
Platelets: 381 10*3/uL (ref 150–400)
RBC: 4.18 MIL/uL — ABNORMAL LOW (ref 4.22–5.81)
RDW: 14.6 % (ref 11.5–15.5)
WBC: 16.3 10*3/uL — ABNORMAL HIGH (ref 4.0–10.5)
nRBC: 0.1 % (ref 0.0–0.2)

## 2021-11-30 LAB — GLUCOSE, CAPILLARY
Glucose-Capillary: 103 mg/dL — ABNORMAL HIGH (ref 70–99)
Glucose-Capillary: 85 mg/dL (ref 70–99)
Glucose-Capillary: 89 mg/dL (ref 70–99)
Glucose-Capillary: 66 mg/dL — ABNORMAL LOW (ref 70–99)
Glucose-Capillary: 103 mg/dL — ABNORMAL HIGH (ref 70–99)
Glucose-Capillary: 86 mg/dL (ref 70–99)
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
## 2021-11-30 NOTE — Progress Notes (Signed)
While attempting to change dressing to PICC line, patient was initially agreeable.   Education was provided regarding the sterile process. While changing the dressing, pt then moved his arm towards midline, after dressing was removed.   Education was once again provided regarding the sterile process and the need to remain still while changing securing device, patient then turned his head and mumbled under his breath.    RN asked if he had a concern, or problem with the process. Patient stated "I don't have a problem yet". RN continued to secure PICC and sterile dressing.   Primary RN at bedside during the entire process. Safety ensured.
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
## 2021-12-01 ENCOUNTER — Inpatient Hospital Stay (HOSPITAL_COMMUNITY): Payer: Self-pay

## 2021-12-01 LAB — GLUCOSE, CAPILLARY
Glucose-Capillary: 101 mg/dL — ABNORMAL HIGH (ref 70–99)
Glucose-Capillary: 112 mg/dL — ABNORMAL HIGH (ref 70–99)
Glucose-Capillary: 113 mg/dL — ABNORMAL HIGH (ref 70–99)
Glucose-Capillary: 132 mg/dL — ABNORMAL HIGH (ref 70–99)
Glucose-Capillary: 82 mg/dL (ref 70–99)

## 2021-12-01 LAB — CBC
HCT: 40.3 % (ref 39.0–52.0)
Hemoglobin: 14.6 g/dL (ref 13.0–17.0)
MCH: 35.2 pg — ABNORMAL HIGH (ref 26.0–34.0)
MCHC: 36.2 g/dL — ABNORMAL HIGH (ref 30.0–36.0)
MCV: 97.1 fL (ref 80.0–100.0)
Platelets: 385 10*3/uL (ref 150–400)
RBC: 4.15 MIL/uL — ABNORMAL LOW (ref 4.22–5.81)
RDW: 14.4 % (ref 11.5–15.5)
WBC: 15 10*3/uL — ABNORMAL HIGH (ref 4.0–10.5)
nRBC: 0 % (ref 0.0–0.2)

## 2021-12-01 MED ORDER — CLOPIDOGREL BISULFATE 75 MG PO TABS
75.0000 mg | ORAL_TABLET | Freq: Every day | ORAL | Status: DC
  Administered 2021-12-01 – 2021-12-02 (×2): 75 mg via ORAL
  Filled 2021-12-01 (×2): qty 1

## 2021-12-01 MED ORDER — ALTEPLASE 2 MG IJ SOLR
2.0000 mg | Freq: Once | INTRAMUSCULAR | Status: AC
  Administered 2021-12-01: 2 mg
  Filled 2021-12-01 (×2): qty 2

## 2021-12-01 NOTE — Progress Notes (Addendum)
PT Cancellation Note  Patient Details Name: Frank Warner MRN: [id number] DOB: February 23, 1981   Cancelled Treatment:    Reason Eval/Treat Not Completed: Patient at procedure or test/unavailable. Pt off the floor at CT scan. PT then returned, pt very irritated with people coming in the room constantly and reporting "I'm so tired. I just want to sleep." PT to return as able to progress mobility.   Kittie Plater, PT, DPT Acute Rehabilitation Services Secure chat preferred Office #: [phone]    Berline Lopes 12/01/2021, 11:48 AM

## 2021-12-01 NOTE — Progress Notes (Signed)
Inpatient Rehabilitation Admissions Coordinator   I await bed availability to admit to CIR.  Danne Baxter, RN, MSN Rehab Admissions Coordinator [phone] 12/01/2021 1:06 PM
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
## 2021-12-01 NOTE — Progress Notes (Addendum)
SLP Cancellation Note  Patient Details Name: Frank Warner MRN: [id number] DOB: 28-Feb-1981   Cancelled treatment:        Attempted to see pt for ongoing dysphagia management and cognitive linguistic treatment.  Pt asleep at time of SLP attempt.  Family at bedside reports he had just fallen asleep and he appeared to be resting very deeply.  SLP deferred treatment this date 2/2 to somnolence.  Visitor suggests he might do better in the afternoons as he is generally not a morning person.   Celedonio Savage, [place], [place] Office: [phone] 12/01/2021, 12:01 PM

## 2021-12-02 ENCOUNTER — Encounter (HOSPITAL_COMMUNITY): Payer: Self-pay | Admitting: Neurology

## 2021-12-02 ENCOUNTER — Inpatient Hospital Stay (HOSPITAL_COMMUNITY)
Admission: RE | Admit: 2021-12-02 | Discharge: 2021-12-24 | DRG: 057 | Disposition: A | Discharge status: Home / Self Care | Payer: Self-pay | Source: Intra-hospital | Attending: Physical Medicine & Rehabilitation | Admitting: Physical Medicine & Rehabilitation

## 2021-12-02 DIAGNOSIS — G4700 Insomnia, unspecified: Secondary | ICD-10-CM | POA: Diagnosis present

## 2021-12-02 DIAGNOSIS — I63512 Cerebral infarction due to unspecified occlusion or stenosis of left middle cerebral artery: Secondary | ICD-10-CM

## 2021-12-02 DIAGNOSIS — D62 Acute posthemorrhagic anemia: Secondary | ICD-10-CM | POA: Diagnosis present

## 2021-12-02 DIAGNOSIS — D72829 Elevated white blood cell count, unspecified: Secondary | ICD-10-CM | POA: Diagnosis present

## 2021-12-02 DIAGNOSIS — S0083XD Contusion of other part of head, subsequent encounter: Secondary | ICD-10-CM

## 2021-12-02 DIAGNOSIS — I69318 Other symptoms and signs involving cognitive functions following cerebral infarction: Secondary | ICD-10-CM

## 2021-12-02 DIAGNOSIS — I639 Cerebral infarction, unspecified: Secondary | ICD-10-CM | POA: Diagnosis present

## 2021-12-02 DIAGNOSIS — I69391 Dysphagia following cerebral infarction: Secondary | ICD-10-CM

## 2021-12-02 DIAGNOSIS — I10 Essential (primary) hypertension: Secondary | ICD-10-CM | POA: Diagnosis present

## 2021-12-02 DIAGNOSIS — R3915 Urgency of urination: Secondary | ICD-10-CM | POA: Diagnosis present

## 2021-12-02 DIAGNOSIS — R414 Neurologic neglect syndrome: Secondary | ICD-10-CM | POA: Diagnosis present

## 2021-12-02 DIAGNOSIS — I69392 Facial weakness following cerebral infarction: Secondary | ICD-10-CM

## 2021-12-02 DIAGNOSIS — E876 Hypokalemia: Secondary | ICD-10-CM | POA: Diagnosis present

## 2021-12-02 DIAGNOSIS — Z8679 Personal history of other diseases of the circulatory system: Secondary | ICD-10-CM

## 2021-12-02 DIAGNOSIS — W19XXXD Unspecified fall, subsequent encounter: Secondary | ICD-10-CM | POA: Diagnosis present

## 2021-12-02 DIAGNOSIS — I63311 Cerebral infarction due to thrombosis of right middle cerebral artery: Secondary | ICD-10-CM

## 2021-12-02 DIAGNOSIS — K0889 Other specified disorders of teeth and supporting structures: Secondary | ICD-10-CM | POA: Diagnosis present

## 2021-12-02 DIAGNOSIS — E785 Hyperlipidemia, unspecified: Secondary | ICD-10-CM | POA: Diagnosis present

## 2021-12-02 DIAGNOSIS — I69354 Hemiplegia and hemiparesis following cerebral infarction affecting left non-dominant side: Principal | ICD-10-CM

## 2021-12-02 DIAGNOSIS — K5900 Constipation, unspecified: Secondary | ICD-10-CM | POA: Diagnosis present

## 2021-12-02 DIAGNOSIS — F17210 Nicotine dependence, cigarettes, uncomplicated: Secondary | ICD-10-CM | POA: Diagnosis present

## 2021-12-02 DIAGNOSIS — I69322 Dysarthria following cerebral infarction: Secondary | ICD-10-CM

## 2021-12-02 DIAGNOSIS — S55001A Unspecified injury of ulnar artery at forearm level, right arm, initial encounter: Secondary | ICD-10-CM | POA: Diagnosis present

## 2021-12-02 DIAGNOSIS — R509 Fever, unspecified: Secondary | ICD-10-CM | POA: Diagnosis present

## 2021-12-02 DIAGNOSIS — R4587 Impulsiveness: Secondary | ICD-10-CM | POA: Diagnosis present

## 2021-12-02 DIAGNOSIS — R400 Somnolence: Secondary | ICD-10-CM | POA: Diagnosis present

## 2021-12-02 DIAGNOSIS — Z9081 Acquired absence of spleen: Secondary | ICD-10-CM

## 2021-12-02 DIAGNOSIS — R569 Unspecified convulsions: Secondary | ICD-10-CM

## 2021-12-02 DIAGNOSIS — R32 Unspecified urinary incontinence: Secondary | ICD-10-CM | POA: Diagnosis present

## 2021-12-02 LAB — GLUCOSE, CAPILLARY: Glucose-Capillary: 112 mg/dL — ABNORMAL HIGH (ref 70–99)

## 2021-12-02 MED ORDER — POLYETHYLENE GLYCOL 3350 17 G PO PACK: 17.0000 g | PACK | Freq: Every day | ORAL | Status: DC | PRN

## 2021-12-02 MED ORDER — CHLORHEXIDINE GLUCONATE CLOTH 2 % EX PADS: 6.0000 | MEDICATED_PAD | Freq: Every day | CUTANEOUS | Status: DC

## 2021-12-02 MED ORDER — PROCHLORPERAZINE MALEATE 5 MG PO TABS: 5.0000 mg | ORAL_TABLET | Freq: Four times a day (QID) | ORAL | Status: DC | PRN

## 2021-12-02 MED ORDER — PROCHLORPERAZINE 25 MG RE SUPP: 12.5000 mg | Freq: Four times a day (QID) | RECTAL | Status: DC | PRN

## 2021-12-02 MED ORDER — DOCUSATE SODIUM 100 MG PO CAPS: 100.0000 mg | ORAL_CAPSULE | Freq: Two times a day (BID) | ORAL | 0 refills | Status: DC

## 2021-12-02 MED ORDER — SENNOSIDES-DOCUSATE SODIUM 8.6-50 MG PO TABS: 1.0000 | ORAL_TABLET | Freq: Every evening | ORAL | Status: DC | PRN

## 2021-12-02 MED ORDER — POTASSIUM CHLORIDE CRYS ER 20 MEQ PO TBCR
20.0000 meq | EXTENDED_RELEASE_TABLET | Freq: Every day | ORAL | Status: AC
  Administered 2021-12-02 – 2021-12-03 (×2): 20 meq via ORAL
  Filled 2021-12-02 (×2): qty 1

## 2021-12-02 MED ORDER — ASPIRIN 81 MG PO TBEC
81.0000 mg | DELAYED_RELEASE_TABLET | Freq: Every day | ORAL | Status: DC
  Administered 2021-12-03 – 2021-12-24 (×22): 81 mg via ORAL
  Filled 2021-12-02 (×22): qty 1

## 2021-12-02 MED ORDER — ENOXAPARIN SODIUM 40 MG/0.4ML IJ SOSY
40.0000 mg | PREFILLED_SYRINGE | INTRAMUSCULAR | Status: DC
  Administered 2021-12-02 – 2021-12-14 (×13): 40 mg via SUBCUTANEOUS
  Filled 2021-12-02 (×14): qty 0.4

## 2021-12-02 MED ORDER — LEVETIRACETAM 750 MG PO TABS: 1500.0000 mg | ORAL_TABLET | Freq: Two times a day (BID) | ORAL | Status: DC

## 2021-12-02 MED ORDER — ALUM & MAG HYDROXIDE-SIMETH 200-200-20 MG/5ML PO SUSP: 30.0000 mL | ORAL | Status: DC | PRN

## 2021-12-02 MED ORDER — FLEET ENEMA 7-19 GM/118ML RE ENEM: 1.0000 | ENEMA | Freq: Once | RECTAL | Status: DC | PRN

## 2021-12-02 MED ORDER — ROSUVASTATIN CALCIUM 40 MG PO TABS: 40.0000 mg | ORAL_TABLET | Freq: Every day | ORAL | Status: DC

## 2021-12-02 MED ORDER — DIPHENHYDRAMINE HCL 12.5 MG/5ML PO ELIX: 12.5000 mg | ORAL_SOLUTION | Freq: Four times a day (QID) | ORAL | Status: DC | PRN

## 2021-12-02 MED ORDER — ENSURE ENLIVE PO LIQD
237.0000 mL | Freq: Two times a day (BID) | ORAL | Status: DC
  Administered 2021-12-03 – 2021-12-24 (×9): 237 mL via ORAL

## 2021-12-02 MED ORDER — ACETAMINOPHEN 325 MG PO TABS
325.0000 mg | ORAL_TABLET | ORAL | Status: DC | PRN
  Administered 2021-12-04 – 2021-12-21 (×2): 650 mg via ORAL
  Filled 2021-12-02: qty 2

## 2021-12-02 MED ORDER — ROSUVASTATIN CALCIUM 20 MG PO TABS
40.0000 mg | ORAL_TABLET | Freq: Every day | ORAL | Status: DC
  Administered 2021-12-03 – 2021-12-24 (×22): 40 mg via ORAL
  Filled 2021-12-02 (×22): qty 2

## 2021-12-02 MED ORDER — PANTOPRAZOLE SODIUM 40 MG PO TBEC: 40.0000 mg | DELAYED_RELEASE_TABLET | Freq: Every day | ORAL | Status: DC

## 2021-12-02 MED ORDER — TRAZODONE HCL 50 MG PO TABS: 25.0000 mg | ORAL_TABLET | Freq: Every evening | ORAL | Status: DC | PRN

## 2021-12-02 MED ORDER — ASPIRIN 81 MG PO TBEC: 81.0000 mg | DELAYED_RELEASE_TABLET | Freq: Every day | ORAL | 12 refills | Status: DC

## 2021-12-02 MED ORDER — SODIUM CHLORIDE 0.9% FLUSH: 10.0000 mL | INTRAVENOUS | Status: DC | PRN

## 2021-12-02 MED ORDER — SODIUM CHLORIDE 0.9% FLUSH
10.0000 mL | Freq: Two times a day (BID) | INTRAVENOUS | Status: DC
  Administered 2021-12-03: 20 mL
  Administered 2021-12-03: 10 mL

## 2021-12-02 MED ORDER — POLYETHYLENE GLYCOL 3350 17 G PO PACK: 17.0000 g | PACK | Freq: Two times a day (BID) | ORAL | 0 refills | Status: DC

## 2021-12-02 MED ORDER — ADULT MULTIVITAMIN W/MINERALS CH
1.0000 | ORAL_TABLET | Freq: Every day | ORAL | Status: DC
  Administered 2021-12-03 – 2021-12-24 (×22): 1 via ORAL
  Filled 2021-12-02 (×22): qty 1

## 2021-12-02 MED ORDER — CLOPIDOGREL BISULFATE 75 MG PO TABS
75.0000 mg | ORAL_TABLET | Freq: Every day | ORAL | Status: AC
  Administered 2021-12-03 – 2021-12-22 (×20): 75 mg via ORAL
  Filled 2021-12-02 (×20): qty 1

## 2021-12-02 MED ORDER — POLYETHYLENE GLYCOL 3350 17 G PO PACK
17.0000 g | PACK | Freq: Two times a day (BID) | ORAL | Status: DC
  Administered 2021-12-02 – 2021-12-08 (×6): 17 g via ORAL
  Filled 2021-12-02 (×15): qty 1

## 2021-12-02 MED ORDER — CLOPIDOGREL BISULFATE 75 MG PO TABS: 75.0000 mg | ORAL_TABLET | Freq: Every day | ORAL | Status: DC

## 2021-12-02 MED ORDER — GUAIFENESIN-DM 100-10 MG/5ML PO SYRP
5.0000 mL | ORAL_SOLUTION | Freq: Four times a day (QID) | ORAL | Status: DC | PRN
  Administered 2021-12-04: 10 mL via ORAL
  Filled 2021-12-02: qty 10

## 2021-12-02 MED ORDER — PANTOPRAZOLE SODIUM 40 MG PO TBEC
40.0000 mg | DELAYED_RELEASE_TABLET | Freq: Every day | ORAL | Status: DC
  Administered 2021-12-03 – 2021-12-06 (×4): 40 mg via ORAL
  Filled 2021-12-02 (×4): qty 1

## 2021-12-02 MED ORDER — BISACODYL 10 MG RE SUPP: 10.0000 mg | Freq: Every day | RECTAL | Status: DC | PRN

## 2021-12-02 MED ORDER — PROCHLORPERAZINE EDISYLATE 10 MG/2ML IJ SOLN: 5.0000 mg | Freq: Four times a day (QID) | INTRAMUSCULAR | Status: DC | PRN

## 2021-12-02 MED ORDER — ONDANSETRON HCL 4 MG/2ML IJ SOLN: 4.0000 mg | Freq: Four times a day (QID) | INTRAMUSCULAR | 0 refills | Status: DC | PRN

## 2021-12-02 MED ORDER — LEVETIRACETAM 750 MG PO TABS
1500.0000 mg | ORAL_TABLET | Freq: Two times a day (BID) | ORAL | Status: DC
  Administered 2021-12-02 – 2021-12-05 (×6): 1500 mg via ORAL
  Filled 2021-12-02 (×6): qty 2

## 2021-12-02 NOTE — Plan of Care (Signed)
  Problem: RH BOWEL ELIMINATION Goal: RH STG MANAGE BOWEL WITH ASSISTANCE Description: STG Manage Bowel with mod I Assistance. Outcome: Not Progressing; constipation LBM per report 9/30   Problem: RH COGNITION-NURSING Goal: RH STG USES MEMORY AIDS/STRATEGIES W/ASSIST TO PROBLEM SOLVE Description: STG Uses Memory Aids/Strategies With cues Assistance to Problem Solve. Outcome: Not Progressing; forgetful

## 2021-12-02 NOTE — Progress Notes (Signed)
Inpatient Rehabilitation Admission Medication Review by a Pharmacist  A complete drug regimen review was completed for this patient to identify any potential clinically significant medication issues.  High Risk Drug Classes Is patient taking? Indication by Medication  Antipsychotic Yes Compazine- nausea  Anticoagulant Yes Lovenox- VTE prophylaxis  Antibiotic No   Opioid No   Antiplatelet Yes Plavix/ ASA- DAPT X 3 weeks to be followed by ASA alone .  Last day for Plavix is 12/22/21.  Hypoglycemics/insulin No   Vasoactive Medication No   Chemotherapy No   Other Yes Keppra- seizures Protonix-GERD Rosuvastatin- HLD Trazodone-sleep     Type of Medication Issue Identified Description of Issue Recommendation(s)  Drug Interaction(s) (clinically significant)     Duplicate Therapy     Allergy     No Medication Administration End Date     Incorrect Dose     Additional Drug Therapy Needed     Significant med changes from prior encounter (inform family/care partners about these prior to discharge). Ibuprofen prior to admission- Discontinued by [facility].   Other       Clinically significant medication issues were identified that warrant physician communication and completion of prescribed/recommended actions by midnight of the next day:  No  Name of provider notified for urgent issues identified:   Provider Method of Notification:    Pharmacist comments:   Time spent performing this drug regimen review (minutes):  [place], RPh Clinical Pharmacist 12/02/2021 2:50 PM

## 2021-12-02 NOTE — Progress Notes (Signed)
Inpatient Rehabilitation Admissions Coordinator   CIR bed is available to admit him to today. I met with patient at bedside with his friend, Fidel Levy, and spoke with his Mom by phone. Acute team and TOC made aware. I will make the arrangements to admit today.  Danne Baxter, RN, MSN Rehab Admissions Coordinator [phone] 12/02/2021 10:21 AM

## 2021-12-02 NOTE — H&P (Addendum)
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   Angelina Sheriff, DO 12/02/2021

## 2021-12-02 NOTE — Progress Notes (Signed)
Patient ID: Frank Warner, male   DOB: 1981-02-21, 41 y.o.   MRN: [id number] Met with the patient and friend and mom (via phone) to review current situation, rehab process, team conference and plan of care. Discussed secondary risk management including HLD (lipitor LDL 162/Trig 102) DAPT x 3 weeks, then ASA solo per neurology, seizure precautions and meds, smoking cessation and dietary modification recommendations. Continue to follow along to address educational needs to facilitate preparation for discharge home w mother. Margarito Liner
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
## 2021-12-02 NOTE — Progress Notes (Signed)
Nutrition Follow-up  DOCUMENTATION CODES:  Not applicable  INTERVENTION:  - Ensure Enlive po BID, each supplement provides 350 kcal and 20 grams of protein - MVI with minerals daily - Encourage PO intake and compliancy with diet - Request new weight  NUTRITION DIAGNOSIS:  Inadequate oral intake related to inability to eat as evidenced by NPO status. - Progressing, pt now on dysphagia 2 diet with thin liquids  GOAL:  Patient will meet greater than or equal to 90% of their needs - Progressing  MONITOR:  PO intake, Supplement acceptance, Labs, Weight trends  REASON FOR ASSESSMENT:  Consult Enteral/tube feeding initiation and management  ASSESSMENT:  41 year-old male with medical history of cluster headaches and tobacco use. His family thought they heard a noise and when they went to investigate, they found him on rigid on the floor with L-sided weakness, slurred speech, and gaze preference. He was admitted with dx of R-sided ICA-MCA occlusion and was taken for mechanical thrombectomy.  9/25 - extubated 9/27 - s/p FEES, diet advanced to regular with thin liquids, later had emesis with PO intake and difficulty clearing secretions, NPO 9/28 - SLP evaluation, DYS 2, thins  Pt resting in bed, significant other at bedside. Pt not very forthcoming with information on nutritional intake. States he has been eating, but not food provided from food services. Seems to be a lot of fast food. Reviewed his diet order and the importance or compliance with SLP recommendations to ensure that he does not aspirate or get choked. Do not anticipate this will be followed.  Pt does report he is drinking the ensure. Discussed the importance of good oral intake at CIR to ensure that his functional status can be regained. No questions at this time.  Average Meal Intake: 10/4: 50% intake x 1 recorded meals  Nutritionally Relevant Medications: Scheduled Meds:  docusate sodium  100 mg Oral BID   Ensure  Plus High Protein  237 mL Oral BID BM   levETIRAcetam  1,500 mg Oral BID   multivitamin with minerals  1 tablet Oral Daily   pantoprazole  40 mg Oral Daily   polyethylene glycol  17 g Oral BID   rosuvastatin  40 mg Oral Daily   PRN Meds: ondansetron, senna-docusate  Labs Reviewed  Diet Order:   Diet Order             DIET DYS 2 Room service appropriate? Yes; Fluid consistency: Thin  Diet effective now                   EDUCATION NEEDS:  Education needs have been addressed  Skin:  Skin Assessment: Reviewed RN Assessment  Last BM:  9/30  Height:  Ht Readings from Last 1 Encounters:  11/19/21 6' (1.829 m)    Weight:  Wt Readings from Last 1 Encounters:  11/25/21 99.9 kg    Ideal Body Weight:  80.9 kg  BMI:  Body mass index is 29.87 kg/m.  Estimated Nutritional Needs:  Kcal:  2400-2600 Protein:  120-135 grams Fluid:  >/= 2.2 L    Ranell Patrick, RD, LDN Clinical Dietitian RD pager # available in [place]  After hours/weekend pager # available in [facility]

## 2021-12-02 NOTE — Plan of Care (Signed)
  Problem: Education: Goal: Knowledge of General Education information will improve Description: Including pain rating scale, medication(s)/side effects and non-pharmacologic comfort measures Outcome: Progressing   Problem: Health Behavior/Discharge Planning: Goal: Ability to manage health-related needs will improve Outcome: Progressing   Problem: Clinical Measurements: Goal: Ability to maintain clinical measurements within normal limits will improve Outcome: Progressing Goal: Will remain free from infection Outcome: Progressing Goal: Diagnostic test results will improve Outcome: Progressing Goal: Respiratory complications will improve Outcome: Progressing Goal: Cardiovascular complication will be avoided Outcome: Progressing   Problem: Activity: Goal: Risk for activity intolerance will decrease Outcome: Progressing   Problem: Nutrition: Goal: Adequate nutrition will be maintained Outcome: Progressing   Problem: Coping: Goal: Level of anxiety will decrease Outcome: Progressing   Problem: Elimination: Goal: Will not experience complications related to bowel motility Outcome: Progressing Goal: Will not experience complications related to urinary retention Outcome: Progressing   Problem: Pain Managment: Goal: General experience of comfort will improve Outcome: Progressing   Problem: Safety: Goal: Ability to remain free from injury will improve Outcome: Progressing   Problem: Skin Integrity: Goal: Risk for impaired skin integrity will decrease Outcome: Progressing   Problem: Education: Goal: Understanding of CV disease, CV risk reduction, and recovery process will improve Outcome: Progressing Goal: Individualized Educational Video(s) Outcome: Progressing   Problem: Activity: Goal: Ability to return to baseline activity level will improve Outcome: Progressing   Problem: Cardiovascular: Goal: Ability to achieve and maintain adequate cardiovascular perfusion  will improve Outcome: Progressing   Problem: Health Behavior/Discharge Planning: Goal: Ability to safely manage health-related needs after discharge will improve Outcome: Progressing   Problem: Activity: Goal: Ability to tolerate increased activity will improve Outcome: Progressing   Problem: Respiratory: Goal: Ability to maintain a clear airway and adequate ventilation will improve Outcome: Progressing   Problem: Role Relationship: Goal: Method of communication will improve Outcome: Progressing   Problem: Safety: Goal: Non-violent Restraint(s) Outcome: Progressing   Problem: Cardiovascular: Goal: Vascular access site(s) Level 0-1 will be maintained Outcome: Completed/Met

## 2021-12-02 NOTE — Progress Notes (Signed)
INPATIENT REHABILITATION ADMISSION NOTE   Arrival Method:bed     Mental Orientation:alert   Assessment:done   Skin:done   IV'S:picc right upper arm   Pain:none   Tubes and Drains:none   Safety Measures: done  Vital Signs:done   Height and Weight:done   Rehab Orientation:done   Family:friend and aunt    Notes:

## 2021-12-02 NOTE — TOC Transition Note (Signed)
Transition of Care [facility]) - CM/SW Discharge Note   Patient Details  Name: Frank Warner MRN: [id number] Date of Birth: [birth year]  [facility]) CM/SW Contact:  Pollie Friar, RN Phone Number: 12/02/2021, 11:23 AM   Clinical Narrative:    Pt is discharging to CIR today. [name] need PCP at one of the [facility] after his rehab stay.  CM signing off.    Final next level of care: IP Rehab Facility Barriers to Discharge: Inadequate or no insurance, Barriers Unresolved (comment)   Patient Goals and CMS Choice Patient states their goals for this hospitalization and ongoing recovery are:: To go to rehab abd then return home CMS Medicare.gov Compare Post Acute Care list provided to:: Patient Choice offered to / list presented to : Patient  Discharge Placement                       Discharge Plan and Services   Discharge Planning Services: CM Consult Post Acute Care Choice: IP Rehab                               Social Determinants of Health (SDOH) Interventions     Readmission Risk Interventions     No data to display

## 2021-12-03 DIAGNOSIS — I63512 Cerebral infarction due to unspecified occlusion or stenosis of left middle cerebral artery: Secondary | ICD-10-CM

## 2021-12-03 DIAGNOSIS — I10 Essential (primary) hypertension: Secondary | ICD-10-CM

## 2021-12-03 DIAGNOSIS — D72829 Elevated white blood cell count, unspecified: Secondary | ICD-10-CM

## 2021-12-03 DIAGNOSIS — D649 Anemia, unspecified: Secondary | ICD-10-CM

## 2021-12-03 DIAGNOSIS — K5900 Constipation, unspecified: Secondary | ICD-10-CM

## 2021-12-03 LAB — CBC WITH DIFFERENTIAL/PLATELET
Abs Immature Granulocytes: 0.11 10*3/uL — ABNORMAL HIGH (ref 0.00–0.07)
Basophils Absolute: 0.1 10*3/uL (ref 0.0–0.1)
Basophils Relative: 0 %
Eosinophils Absolute: 0.2 10*3/uL (ref 0.0–0.5)
Eosinophils Relative: 2 %
HCT: 42.1 % (ref 39.0–52.0)
Hemoglobin: 14.8 g/dL (ref 13.0–17.0)
Immature Granulocytes: 1 %
Lymphocytes Relative: 25 %
Lymphs Abs: 3.9 10*3/uL (ref 0.7–4.0)
MCH: 34.7 pg — ABNORMAL HIGH (ref 26.0–34.0)
MCHC: 35.2 g/dL (ref 30.0–36.0)
MCV: 98.6 fL (ref 80.0–100.0)
Monocytes Absolute: 1.1 10*3/uL — ABNORMAL HIGH (ref 0.1–1.0)
Monocytes Relative: 7 %
Neutro Abs: 10.2 10*3/uL — ABNORMAL HIGH (ref 1.7–7.7)
Neutrophils Relative %: 65 %
Platelets: 446 10*3/uL — ABNORMAL HIGH (ref 150–400)
RBC: 4.27 MIL/uL (ref 4.22–5.81)
RDW: 14.4 % (ref 11.5–15.5)
WBC: 15.7 10*3/uL — ABNORMAL HIGH (ref 4.0–10.5)
nRBC: 0 % (ref 0.0–0.2)

## 2021-12-03 LAB — COMPREHENSIVE METABOLIC PANEL
ALT: 17 U/L (ref 0–44)
AST: 28 U/L (ref 15–41)
Albumin: 3 g/dL — ABNORMAL LOW (ref 3.5–5.0)
Alkaline Phosphatase: 42 U/L (ref 38–126)
Anion gap: 11 (ref 5–15)
BUN: 14 mg/dL (ref 6–20)
CO2: 24 mmol/L (ref 22–32)
Calcium: 9.9 mg/dL (ref 8.9–10.3)
Chloride: 98 mmol/L (ref 98–111)
Creatinine, Ser: 1.03 mg/dL (ref 0.61–1.24)
GFR, Estimated: >60 mL/min (ref >60)
Glucose, Bld: 95 mg/dL (ref 70–99)
Potassium: 4.1 mmol/L (ref 3.5–5.1)
Sodium: 133 mmol/L — ABNORMAL LOW (ref 135–145)
Total Bilirubin: 0.7 mg/dL (ref 0.3–1.2)
Total Protein: 8.1 g/dL (ref 6.5–8.1)

## 2021-12-03 LAB — MAGNESIUM: Magnesium: 1.8 mg/dL (ref 1.7–2.4)

## 2021-12-03 MED ORDER — CHLORHEXIDINE GLUCONATE CLOTH 2 % EX PADS
6.0000 | MEDICATED_PAD | Freq: Two times a day (BID) | CUTANEOUS | Status: DC
  Administered 2021-12-03 – 2021-12-04 (×2): 6 via TOPICAL

## 2021-12-03 MED ORDER — SORBITOL 70 % SOLN
60.0000 mL | Freq: Once | Status: AC
  Administered 2021-12-03: 60 mL via ORAL
  Filled 2021-12-03: qty 60

## 2021-12-03 MED ORDER — ACETAMINOPHEN 325 MG PO TABS: 325.0000 mg | ORAL_TABLET | ORAL | Status: AC | PRN
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
## 2021-12-03 NOTE — Progress Notes (Signed)
Inpatient Rehabilitation  Patient information reviewed and entered into eRehab system by Rahn Lacuesta M. Akansha Wyche, M.A., CCC/SLP, PPS Coordinator.  Information including medical coding, functional ability and quality indicators will be reviewed and updated through discharge.    

## 2021-12-03 NOTE — Progress Notes (Signed)
[facility] Individual Statement of Services  Patient Name:  Frank Warner  Date:  12/03/2021  Welcome to the [facility].  Our goal is to provide you with an individualized program based on your diagnosis and situation, designed to meet your specific needs.  With this comprehensive rehabilitation program, you will be expected to participate in at least 3 hours of rehabilitation therapies Monday-Friday, with modified therapy programming on the weekends.  Your rehabilitation program will include the following services:  Physical Therapy (PT), Occupational Therapy (OT), Speech Therapy (ST), 24 hour per day rehabilitation nursing, Therapeutic Recreaction (TR), Neuropsychology, Care Coordinator, Rehabilitation Medicine, Nutrition Services, Pharmacy Services, and Other  Weekly team conferences will be held on Wednesdays to discuss your progress.  Your Inpatient Rehabilitation Care Coordinator will talk with you frequently to get your input and to update you on team discussions.  Team conferences with you and your family in attendance may also be held.  Expected length of stay: 14-20 Days  Overall anticipated outcome:  Supervision to Min A  Depending on your progress and recovery, your program may change. Your Inpatient Rehabilitation Care Coordinator will coordinate services and will keep you informed of any changes. Your Inpatient Rehabilitation Care Coordinator's name and contact numbers are listed  below.  The following services may also be recommended but are not provided by the [facility]:   [place] will be made to provide these services after discharge if needed.  Arrangements include referral to agencies that provide these services.  Your insurance has been verified to be:   uninsured Your primary doctor is:  NO PCP  Pertinent information will be  shared with your doctor and your insurance company.  Inpatient Rehabilitation Care Coordinator:  Frank Warner, Ruth or [phone]  Information discussed with and copy given to patient by: Frank Warner, 12/03/2021, 11:07 AM

## 2021-12-03 NOTE — Progress Notes (Addendum)
PROGRESS NOTE   Subjective/Complaints: Pt seen at bedside today. No new concern or complaints elicited.   Review of Systems  Constitutional:  Negative for chills and fever.  Respiratory:  Negative for shortness of breath.   Cardiovascular:  Negative for chest pain.  Gastrointestinal:  Negative for abdominal pain, constipation, diarrhea, nausea and vomiting.    Objective:   CT HEAD WO CONTRAST ( )  Result Date: 12/01/2021 CLINICAL DATA:  Stroke follow-up EXAM: CT HEAD WITHOUT CONTRAST TECHNIQUE: Contiguous axial images were obtained from the base of the skull through the vertex without intravenous contrast. RADIATION DOSE REDUCTION: This exam was performed according to the departmental dose-optimization program which includes automated exposure control, adjustment of the mA and/or kV according to patient size and/or use of iterative reconstruction technique. COMPARISON:  11/27/2021 FINDINGS: Brain: Right MCA distribution infarct affecting the insula and frontal cortex and the striatum with intervening white matter. Petechial intraventricular hemorrhage has subsided. Local swelling appears unchanged. No new abnormality. No hydrocephalus. Vascular: Prominent density of right MCA vessels could be from ictus or related to adjacent edematous brain. Skull: Normal. Negative for fracture or focal lesion. Sinuses/Orbits: Negative IMPRESSION: Recent right MCA distribution infarct with regressing hemorrhage. No evidence of progression. Electronically Signed   By: Tiburcio Pea M.D.   On: 12/01/2021 11:25   VAS [place] LOWER EXTREMITY VENOUS (DVT)  Result Date: 11/29/2021  Lower Venous DVT Study Patient Name:  Frank Warner  Date of Exam:   11/29/2021 Medical Rec #: [id number]     Accession #:    [id number] Date of Birth: 1980-08-17      Patient Gender: M Patient Age:   41 years Exam Location:  [facility] Procedure:      VAS [place] LOWER EXTREMITY VENOUS  (DVT) Referring Phys: Dewitt Hoes DE LA TORRE --------------------------------------------------------------------------------  Indications: Ongoing leukocytosis per MD.  Risk Factors: Recent CVA. Comparison Study: Prior negative LEV done 11/20/21 Performing Technologist: Sherren Kerns RVS  Examination Guidelines: A complete evaluation includes B-mode imaging, spectral Doppler, color Doppler, and power Doppler as needed of all accessible portions of each vessel. Bilateral testing is considered an integral part of a complete examination. Limited examinations for reoccurring indications may be performed as noted. The reflux portion of the exam is performed with the patient in reverse Trendelenburg.  +---------+---------------+---------+-----------+----------+--------------+ RIGHT    CompressibilityPhasicitySpontaneityPropertiesThrombus Aging +---------+---------------+---------+-----------+----------+--------------+ CFV      Full           Yes      Yes                                 +---------+---------------+---------+-----------+----------+--------------+ SFJ      Full                                                        +---------+---------------+---------+-----------+----------+--------------+ FV Prox  Full                                                        +---------+---------------+---------+-----------+----------+--------------+  FV Mid   Full                                                        +---------+---------------+---------+-----------+----------+--------------+ FV DistalFull                                                        +---------+---------------+---------+-----------+----------+--------------+ PFV      Full                                                        +---------+---------------+---------+-----------+----------+--------------+ POP      Full           Yes      Yes                                  +---------+---------------+---------+-----------+----------+--------------+ PTV      Full                                                        +---------+---------------+---------+-----------+----------+--------------+ PERO     Full                                                        +---------+---------------+---------+-----------+----------+--------------+ Gastroc  Full                                                        +---------+---------------+---------+-----------+----------+--------------+   +---------+---------------+---------+-----------+----------+--------------+ LEFT     CompressibilityPhasicitySpontaneityPropertiesThrombus Aging +---------+---------------+---------+-----------+----------+--------------+ CFV      Full           Yes      Yes                                 +---------+---------------+---------+-----------+----------+--------------+ SFJ      Full                                                        +---------+---------------+---------+-----------+----------+--------------+ FV Prox  Full                                                        +---------+---------------+---------+-----------+----------+--------------+  FV Mid   Full                                                        +---------+---------------+---------+-----------+----------+--------------+ FV DistalFull                                                        +---------+---------------+---------+-----------+----------+--------------+ PFV      Full                                                        +---------+---------------+---------+-----------+----------+--------------+ POP      Full           Yes      Yes                                 +---------+---------------+---------+-----------+----------+--------------+ PTV      Full                                                         +---------+---------------+---------+-----------+----------+--------------+ PERO     Full                                                        +---------+---------------+---------+-----------+----------+--------------+     Summary: BILATERAL: - No evidence of deep vein thrombosis seen in the lower extremities, bilaterally. -No evidence of popliteal cyst, bilaterally. RIGHT: - Findings appear essentially unchanged compared to previous examination.  LEFT: - Findings appear essentially unchanged compared to previous examination.  *See table(s) above for measurements and observations. Electronically signed by Lemar Livings MD on 11/29/2021 at 7:50:20 PM.    Final    Recent Labs    12/01/21 0520 12/03/21 0430  WBC 15.0* 15.7*  HGB 14.6 14.8  HCT 40.3 42.1  PLT 385 446*   Recent Labs    12/03/21 0430  NA 133*  K 4.1  CL 98  CO2 24  GLUCOSE 95  BUN 14  CREATININE 1.03  CALCIUM 9.9    Intake/Output Summary (Last 24 hours) at 12/03/2021 0809 Last data filed at 12/03/2021 0443 Gross per 24 hour  Intake 240 ml  Output 0 ml  Net 240 ml        Physical Exam: Vital Signs Blood pressure 122/69, pulse 62, temperature 98.7 F (37.1 C), temperature source Oral, resp. rate 16, height 6' (1.829 m), weight 95.7 kg, SpO2 98 %.  General: Alert and oriented x 3, No apparent distress HEENT: oral mucosa pink and moist, [place], AT, R gaze preference Neck: Supple without JVD or lymphadenopathy Heart: Reg rate and rhythm. No murmurs rubs  or gallops Chest: CTA bilaterally without wheezes, rales, or rhonchi; no distress Abdomen: Soft, non-tender, non-distended, bowel sounds positive. Extremities: No clubbing, cyanosis, or edema. Pulses are 2+ Psych: Pt's affect is flat Skin: Clean and intact without signs of breakdown  Neurologic exam:  Cognition: AAO to person, place, partially time (year and day, not month); not event.  Language: Mild dysarthria.  Insight: Poor insight into current condition.   Sensation: To light touch intact in BL UEs and LEs  CN: + L facial droop, + tongue deviation, + L shoulder shrug deficit Coordination: No apparent tremors or ataxia.  Spasticity: MAS 0 in all extremities.   Assessment/Plan: 1. Functional deficits which require 3+ hours per day of interdisciplinary therapy in a comprehensive inpatient rehab setting. Physiatrist is providing close team supervision and 24 hour management of active medical problems listed below. Physiatrist and rehab team continue to assess barriers to discharge/monitor patient progress toward functional and medical goals  Care Tool:  Bathing              Bathing assist       Upper Body Dressing/Undressing Upper body dressing        Upper body assist      Lower Body Dressing/Undressing Lower body dressing            Lower body assist       Toileting Toileting    Toileting assist       Transfers Chair/bed transfer  Transfers assist           Locomotion Ambulation   Ambulation assist              Walk 10 feet activity   Assist           Walk 50 feet activity   Assist           Walk 150 feet activity   Assist           Walk 10 feet on uneven surface  activity   Assist           Wheelchair     Assist               Wheelchair 50 feet with 2 turns activity    Assist            Wheelchair 150 feet activity     Assist          Blood pressure 122/69, pulse 62, temperature 98.7 F (37.1 C), temperature source Oral, resp. rate 16, height 6' (1.829 m), weight 95.7 kg, SpO2 98 %.  Medical Problem List and Plan: 1. 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   Antithrombotics: -DVT/anticoagulation:  Pharmaceutical: Lovenox             -antiplatelet therapy: DAPT x 3 weeks (Plavix thru 10/25) followed by ASA alone.  3. Pain Management: Tylenol prn.  4. Mood/Behavior/Sleep: LCSW to follow for evaluation and support.              -antipsychotic agents: N/A 5. Neuropsych/cognition: This patient is not capable of making  decisions on his own behalf. 6. Skin/Wound Care: routine pressure relief measures.              --continue supplements TID 7. Fluids/Electrolytes/Nutrition: Monitor I/O. Check lytes in am.             --will add Kdur X 2 days to prevent recurrent hypokalemia 8. Seizures: Continue Keppra 1500 mg BID             -- GTC at home on presentation, cEEG 9/22-23 showed no epileptiform discharges.  9. ABLA: Recheck CBC in am  -HGB 14.8 on 10/6, stable 10. HTN: Monitor BP TID--controlled off meds  -10/6 Well controlled  11. Hyperlipidemia: LDL 162--on Crestor 40 mg/day 12. Leucocytosis: Downtrending, 15. Monitor trend. Fevers resolving.  -Current workup: CXR neg 9/30, UA negative, Lower extremity doppler negative for DVT  -WBC stable at 15.7 10/6 13. Intermittent Incontinence: Monitor initial at rehab.         -- LBM 9/26 per chart  -Will order sorbitol  LOS: 1 days A FACE TO FACE EVALUATION WAS PERFORMED  Fanny Dance 12/03/2021, 8:09 AM

## 2021-12-03 NOTE — Discharge Instructions (Addendum)
Inpatient Rehab Discharge Instructions  [facility] Discharge date and time:    Activities/Precautions/ Functional Status: Activity: no lifting, driving, or strenuous exercise till cleared by MD Diet: cardiac diet Wound Care: keep wound clean and dry   Functional status:  ___ No restrictions     ___ Walk up steps independently _X__ 24/7 supervision/assistance   ___ Walk up steps with assistance ___ Intermittent supervision/assistance  ___ Bathe/dress independently ___ Walk with walker     ___ Bathe/dress with assistance ___ Walk Independently    ___ Shower independently ___ Walk with assistance    _X__ Shower with assistance _X__ No alcohol     ___ Return to work/school ________   Special Instructions:  New Patient Visit with Haydee Salter, MD Monday December 27, 2021 Arrive by 12:45 PM. Do not cancel this appointment. Call them if you have issues with transportation.   Please arrive 20 minutes prior to your appointment to allow time for office to update your demographics, insurance, process your copay, and update necessary documentation. If you do not arrive on time, your appointment may need to be rescheduled. Please bring all prescription medication bottles with you to your first visit to ensure we have the most accurate information in your chart. If you are not able to keep your appointment, please notify our office 24 hours in advance to avoid a $50 late cancellation or no show fee. Our office phone number is [phone].           HEP provided by therapy team.   Per [facility] statutes, patients with seizures are not allowed to drive until  they have been seizure-free for six months. Use caution when using heavy equipment or power tools. Avoid working on ladders or at heights. Take showers instead of baths. Ensure the water temperature is not too high on the home water heater. Do not go swimming alone. When caring for infants or small children, sit down when  holding, feeding, or changing them to minimize risk of injury to the child in the event you have a seizure. Also, Maintain good sleep hygiene. Avoid alcohol.   STROKE/TIA DISCHARGE INSTRUCTIONS SMOKING Cigarette smoking nearly doubles your risk of having a stroke & is the single most alterable risk factor  If you smoke or have smoked in the last 12 months, you are advised to quit smoking for your health. Most of the excess cardiovascular risk related to smoking disappears within a year of stopping. Ask you doctor about anti-smoking medications [place] Quit Line: 1-800-QUIT NOW Free Smoking Cessation Classes [phone]  CHOLESTEROL Know your levels; limit fat & cholesterol in your diet  Lipid Panel     Component Value Date/Time   CHOL 244 (H) 11/20/2021 0547   TRIG 102 11/20/2021 0547   HDL 62 11/20/2021 0547   CHOLHDL 3.9 11/20/2021 0547   VLDL 20 11/20/2021 0547   LDLCALC 162 (H) 11/20/2021 0547     Many patients benefit from treatment even if their cholesterol is at goal. Goal: Total Cholesterol (CHOL) less than 160 Goal:  Triglycerides (TRIG) less than 150 Goal:  HDL greater than 40 Goal:  LDL (LDLCALC) less than 100   BLOOD PRESSURE American Stroke Association blood pressure target is less that 120/80 mm/Hg  Your discharge blood pressure is:  BP: 122/69 Monitor your blood pressure Limit your salt and alcohol intake Many individuals will require more than one medication for high blood pressure  DIABETES (A1c is a blood sugar average for last  3 months) Goal HGBA1c is under 7% (HBGA1c is blood sugar average for last 3 months)  Diabetes: No known diagnosis of diabetes    Lab Results  Component Value Date   HGBA1C 5.6 11/20/2021    Your HGBA1c can be lowered with medications, healthy diet, and exercise. Check your blood sugar as directed by your physician Call your physician if you experience unexplained or low blood sugars.  PHYSICAL ACTIVITY/REHABILITATION Goal is 30 minutes  at least 4 days per week  Activity: No driving, Therapies: see above Return to work: N/A --to be decided on follow up Activity decreases your risk of heart attack and stroke and makes your heart stronger.  It helps control your weight and blood pressure; helps you relax and can improve your mood. Participate in a regular exercise program. Talk with your doctor about the best form of exercise for you (dancing, walking, swimming, cycling).  DIET/WEIGHT Goal is to maintain a healthy weight  Your discharge diet is:  Diet Order             DIET DYS 2 Room service appropriate? Yes; Fluid consistency: Thin  Diet effective now                   liquids Your height is:  Height: 6' (182.9 cm) Your current weight is: 210 lbs Weight: 95.7 kg Your Body Mass Index (BMI) is:  BMI (Calculated): 28.61 Following the type of diet specifically designed for you will help prevent another stroke. Your goal weight is 184 lbs   Your goal Body Mass Index (BMI) is 19-24. Healthy food habits can help reduce 3 risk factors for stroke:  High cholesterol, hypertension, and excess weight.  RESOURCES Stroke/Support Group:  Call [phone]   STROKE EDUCATION PROVIDED/REVIEWED AND GIVEN TO PATIENT Stroke warning signs and symptoms How to activate emergency medical system (call 911). Medications prescribed at discharge. Need for follow-up after discharge. Personal risk factors for stroke. Pneumonia vaccine given:  Flu vaccine given:  My questions have been answered, the writing is legible, and I understand these instructions.  I will adhere to these goals & educational materials that have been provided to me after my discharge from the hospital.      My questions have been answered and I understand these instructions. I will adhere to these goals and the provided educational materials after my discharge from the hospital.  Patient/Caregiver Signature _______________________________ Date  __________  Clinician Signature _______________________________________ Date __________  Please bring this form and your medication list with you to all your follow-up doctor's appointments.

## 2021-12-03 NOTE — Plan of Care (Signed)
  Problem: RH Balance Goal: LTG: Patient will maintain dynamic sitting balance (OT) Description: LTG:  Patient will maintain dynamic sitting balance with assistance during activities of daily living (OT) Flowsheets (Taken 12/03/2021 1558) LTG: Pt will maintain dynamic sitting balance during ADLs with: Supervision/Verbal cueing   Problem: Sit to Stand Goal: LTG:  Patient will perform sit to stand in prep for activites of daily living with assistance level (OT) Description: LTG:  Patient will perform sit to stand in prep for activites of daily living with assistance level (OT) Flowsheets (Taken 12/03/2021 1558) LTG: PT will perform sit to stand in prep for activites of daily living with assistance level: Supervision/Verbal cueing   Problem: RH Grooming Goal: LTG Patient will perform grooming w/assist,cues/equip (OT) Description: LTG: Patient will perform grooming with assist, with/without cues using equipment (OT) Flowsheets (Taken 12/03/2021 1558) LTG: Pt will perform grooming with assistance level of: Independent with assistive device    Problem: RH Bathing Goal: LTG Patient will bathe all body parts with assist levels (OT) Description: LTG: Patient will bathe all body parts with assist levels (OT) Flowsheets (Taken 12/03/2021 1558) LTG: Pt will perform bathing with assistance level/cueing: Minimal Assistance - Patient > 75% LTG: Position pt will perform bathing: Shower   Problem: RH Dressing Goal: LTG Patient will perform upper body dressing (OT) Description: LTG Patient will perform upper body dressing with assist, with/without cues (OT). Flowsheets (Taken 12/03/2021 1558) LTG: Pt will perform upper body dressing with assistance level of: Supervision/Verbal cueing Goal: LTG Patient will perform lower body dressing w/assist (OT) Description: LTG: Patient will perform lower body dressing with assist, with/without cues in positioning using equipment (OT) Flowsheets (Taken 12/03/2021  1558) LTG: Pt will perform lower body dressing with assistance level of: Supervision/Verbal cueing   Problem: RH Toileting Goal: LTG Patient will perform toileting task (3/3 steps) with assistance level (OT) Description: LTG: Patient will perform toileting task (3/3 steps) with assistance level (OT)  Flowsheets (Taken 12/03/2021 1558) LTG: Pt will perform toileting task (3/3 steps) with assistance level: Supervision/Verbal cueing   Problem: RH Functional Use of Upper Extremity Goal: LTG Patient will use RT/LT upper extremity as a (OT) Description: LTG: Patient will use right/left upper extremity as a stabilizer/gross assist/diminished/nondominant/dominant level with assist, with/without cues during functional activity (OT) Flowsheets (Taken 12/03/2021 1558) LTG: Use of upper extremity in functional activities: LUE as a stabilizer LTG: Pt will use upper extremity in functional activity with assistance level of: Supervision/Verbal cueing   Problem: RH Toilet Transfers Goal: LTG Patient will perform toilet transfers w/assist (OT) Description: LTG: Patient will perform toilet transfers with assist, with/without cues using equipment (OT) Flowsheets (Taken 12/03/2021 1558) LTG: Pt will perform toilet transfers with assistance level of: Supervision/Verbal cueing   Problem: RH Tub/Shower Transfers Goal: LTG Patient will perform tub/shower transfers w/assist (OT) Description: LTG: Patient will perform tub/shower transfers with assist, with/without cues using equipment (OT) Flowsheets (Taken 12/03/2021 1558) LTG: Pt will perform tub/shower stall transfers with assistance level of: Minimal Assistance - Patient > 75% LTG: Pt will perform tub/shower transfers from: Tub/shower combination   Problem: RH Awareness Goal: LTG: Patient will demonstrate awareness during functional activites type of (OT) Description: LTG: Patient will demonstrate awareness during functional activites type of (OT) Flowsheets  (Taken 12/03/2021 1558) Patient will demonstrate awareness during functional activites type of: Anticipatory LTG: Patient will demonstrate awareness during functional activites type of (OT): Supervision
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
## 2021-12-03 NOTE — Plan of Care (Signed)
  Problem: RH Balance Goal: LTG Patient will maintain dynamic sitting balance (PT) Description: LTG:  Patient will maintain dynamic sitting balance with assistance during mobility activities (PT) Flowsheets (Taken 12/03/2021 1754) LTG: Pt will maintain dynamic sitting balance during mobility activities with:: Supervision/Verbal cueing Goal: LTG Patient will maintain dynamic standing balance (PT) Description: LTG:  Patient will maintain dynamic standing balance with assistance during mobility activities (PT) Flowsheets (Taken 12/03/2021 1754) LTG: Pt will maintain dynamic standing balance during mobility activities with:: Contact Guard/Touching assist Note: With LRAD   Problem: Sit to Stand Goal: LTG:  Patient will perform sit to stand with assistance level (PT) Description: LTG:  Patient will perform sit to stand with assistance level (PT) Flowsheets (Taken 12/03/2021 1754) LTG: PT will perform sit to stand in preparation for functional mobility with assistance level: Supervision/Verbal cueing Note: With LRAD   Problem: RH Bed Mobility Goal: LTG Patient will perform bed mobility with assist (PT) Description: LTG: Patient will perform bed mobility with assistance, with/without cues (PT). Flowsheets (Taken 12/03/2021 1754) LTG: Pt will perform bed mobility with assistance level of: Supervision/Verbal cueing   Problem: RH Bed to Chair Transfers Goal: LTG Patient will perform bed/chair transfers w/assist (PT) Description: LTG: Patient will perform bed to chair transfers with assistance (PT). Flowsheets (Taken 12/03/2021 1754) LTG: Pt will perform Bed to Chair Transfers with assistance level: Supervision/Verbal cueing Note: With LRAD   Problem: RH Car Transfers Goal: LTG Patient will perform car transfers with assist (PT) Description: LTG: Patient will perform car transfers with assistance (PT). Flowsheets (Taken 12/03/2021 1754) LTG: Pt will perform car transfers with assist::  Supervision/Verbal cueing Note: With LRAD   Problem: RH Ambulation Goal: LTG Patient will ambulate in controlled environment (PT) Description: LTG: Patient will ambulate in a controlled environment, # of feet with assistance (PT). Flowsheets (Taken 12/03/2021 1754) LTG: Pt will ambulate in controlled environ  assist needed:: Contact Guard/Touching assist LTG: Ambulation distance in controlled environment: 153ft Note: With LRAD Goal: LTG Patient will ambulate in home environment (PT) Description: LTG: Patient will ambulate in home environment, # of feet with assistance (PT). Flowsheets (Taken 12/03/2021 1754) LTG: Pt will ambulate in home environ  assist needed:: Contact Guard/Touching assist LTG: Ambulation distance in home environment: 90 ft Note: With LRAD   Problem: RH Stairs Goal: LTG Patient will ambulate up and down stairs w/assist (PT) Description: LTG: Patient will ambulate up and down # of stairs with assistance (PT) Flowsheets (Taken 12/03/2021 1754) LTG: Pt will ambulate up/down stairs assist needed:: Contact Guard/Touching assist LTG: Pt will  ambulate up and down number of stairs: 5 steps with B HR

## 2021-12-03 NOTE — Plan of Care (Signed)
  Problem: RH Swallowing Goal: LTG Patient will consume least restrictive diet using compensatory strategies with assistance (SLP) Description: LTG:  Patient will consume least restrictive diet using compensatory strategies with assistance (SLP) Flowsheets (Taken 12/03/2021 1257) LTG: Pt Patient will consume least restrictive diet using compensatory strategies with assistance of (SLP): Supervision Goal: LTG Pt will demonstrate functional change in swallow as evidenced by bedside/clinical objective assessment (SLP) Description: LTG: Patient will demonstrate functional change in swallow as evidenced by bedside/clinical objective assessment (SLP) Flowsheets (Taken 12/03/2021 1257) LTG: Patient will demonstrate functional change in swallow as evidenced by bedside/clinical objective assessment: Oral swallow   Problem: RH Expression Communication Goal: LTG Patient will increase speech intelligibility (SLP) Description: LTG: Patient will increase speech intelligibility at word/phrase/conversation level with cues, % of the time (SLP) Flowsheets (Taken 12/03/2021 1257) LTG: Patient will increase speech intelligibility (SLP): Modified Independent Percent of time patient will use intelligible speech: 90%   Problem: RH Problem Solving Goal: LTG Patient will demonstrate problem solving for (SLP) Description: LTG:  Patient will demonstrate problem solving for basic/complex daily situations with cues  (SLP) Flowsheets (Taken 12/03/2021 1257) LTG: Patient will demonstrate problem solving for (SLP): Complex daily situations LTG Patient will demonstrate problem solving for:  Supervision  Minimal Assistance - Patient > 75%   Problem: RH Memory Goal: LTG Patient will demonstrate ability for day to day (SLP) Description: LTG:   Patient will demonstrate ability for day to day recall/carryover during cognitive/linguistic activities with assist  (SLP) Flowsheets (Taken 12/03/2021 1257) LTG: Patient will demonstrate  ability for day to day recall:  Biographical information  New information LTG: Patient will demonstrate ability for day to day recall/carryover during cognitive/linguistic activities with assist (SLP): Supervision Goal: LTG Patient will use memory compensatory aids to (SLP) Description: LTG:  Patient will use memory compensatory aids to recall biographical/new, daily complex information with cues (SLP) Flowsheets (Taken 12/03/2021 1257) LTG: Patient will use memory compensatory aids to (SLP): Supervision   Problem: RH Attention Goal: LTG Patient will demonstrate this level of attention during functional activites (SLP) Description: LTG:  Patient will will demonstrate this level of attention during functional activites (SLP) Flowsheets (Taken 12/03/2021 1257) Patient will demonstrate during cognitive/linguistic activities the attention type of: Sustained LTG: Patient will demonstrate this level of attention during cognitive/linguistic activities with assistance of (SLP): Supervision Number of minutes patient will demonstrate attention during cognitive/linguistic activities: 15-20   Problem: RH Awareness Goal: LTG: Patient will demonstrate awareness during functional activites type of (SLP) Description: LTG: Patient will demonstrate awareness during functional activites type of (SLP) Flowsheets (Taken 12/03/2021 1257) Patient will demonstrate during cognitive/linguistic activities awareness type of: Emergent LTG: Patient will demonstrate awareness during cognitive/linguistic activities with assistance of (SLP): Supervision

## 2021-12-04 NOTE — Progress Notes (Signed)
Occupational Therapy Session Note  Patient Details  Name: Frank Warner MRN: [id number] Date of Birth: [birth year]  Today's Date: 12/04/2021 OT Individual Time: 1000-1100 OT Individual Time Calculation (min): 60 min    Short Term Goals: Week 1:  OT Short Term Goal 1 (Week 1): Pt will increase UB dressing while seated on EOB or at sink with Mod A utilizing hemi dressing techniques. OT Short Term Goal 2 (Week 1): Pt will increase LB dressing while seated on EOB or at sink with Mod A utilizing hemi dressing techniques. OT Short Term Goal 3 (Week 1): Pt will increase dynamic sitting balance to SBA while completing self care tasks such as LB dressing and bathing. Week 2:     Skilled Therapeutic Interventions/Progress Updates:    Patient in bed asleep upon arrival, he indicated that he rested well last night, but was given morning meds and is a little sleepy.  The pt agreed to complete BADL in bathing and dressing EOB, he was able to transfer from supine to EOB with Min/ModA .  The pt require redirecting, both physical and verbal prompts for maintaining good anatomical positioning unsupported while using the bed rails for additional balance.  The pt was able to bathe his UB with [place] for his RUE secondary to his hemiparetic LUE.  The pt was able to wash his bottom at bed level using the bed rails for additional balance demonstrating bed mobilty at [facility] for rolling left or right.  The pt was able to bridge with SBA for donning his brief and pants requiring  ModA secondary to challenges with bilateral hand coordination. His family was present throughout the treatment session and all question were addressed.  At the end of treatment the bedside table and call light were in place and the bed alarm was activated.   Therapy Documentation Precautions:  Precautions Precautions: Fall Precaution Comments: L inattention, L hemiparesis, L lean, hx of seizures Restrictions Weight Bearing Restrictions:  No  Therapy/Group: Individual Therapy  Yvonne Kendall 12/04/2021, 4:41 PM

## 2021-12-04 NOTE — Progress Notes (Signed)
Physical Therapy Session Note  Patient Details  Name: Frank Warner MRN: [id number] Date of Birth: 08-01-1980  Today's Date: 12/04/2021 PT Individual Time: 1300-1415 PT Individual Time Calculation (min): 75 min   Short Term Goals: Week 1:  PT Short Term Goal 1 (Week 1): Pt will perform bed mobility with CGA. PT Short Term Goal 2 (Week 1): Pt will perform sit<> stand transfer with CGA consistently and LRAD. PT Short Term Goal 3 (Week 1): Pt will perform bed<>chair transfer with CGA consistently and LRAD. PT Short Term Goal 4 (Week 1): Pt will amb 90 ft with mod assist +1 consistenly and LRAD. PT Short Term Goal 5 (Week 1): Pt will attempt stairs. Week 2:    Week 3:     Skilled Therapeutic Interventions/Progress Updates:    Pain, denies pain Pt initially sleeping but easily awakened. Supine to sit w/heavy cueing to attend to L hemibody, mod assist. stand pivot transfer to wc w/min assist to R side.  Standing on foam in parallel bars worked on midline, L quad and glut activation in standing.  Gait trials: 141ft via 31muskateers transitioning to HHA R/overall mod for wt shifting and stabilization of L knee w/loading/midstance.  Litegait - pt educated w/device/set up/safety/goal of increasing gait distance.  Pt agreeable.  Harness donned w/pt sitting/standing. L hand secured w/gait belt to grip.  Lght bodywt assist provided.  In [place] pt ambulated >349ft w/mod assist for wtshifing to R w/swing phase L, L lean, cues to attend to limb advancement L/is able to complete full step when not distracted w/mod assist for wt shifting, pt gradually able to stabilize L knee thru stance over time when cues provided at shoulder girdle or at hip for stabilization/wt shift.   Pt c/o L achilles discomfort.  Palpation revealed deficit in tendon integrity.  Pt stated that he had "ruptured achilles" in June and was placed in walking boot w/surgery recommended in ED but pt never followed up and discontinued  wearing boot after 2-3 mos (also stated recommended 32mos boot wear). Pt instructed to have family bring in boot.  Recommend ortho consult for guidance/assessment of status of achilles rupture.   stand pivot transfer to bed from wc w/min assist.  Pt left supine w/rails up x 3, alarm set, bed in lowest position, and needs in reach. Friend in room.    Therapy Documentation Precautions:  Precautions Precautions: Fall Precaution Comments: L inattention, L hemiparesis, L lean, hx of seizures Restrictions Weight Bearing Restrictions: No    Therapy/Group: Individual Therapy  Jerrilyn Cairo 12/04/2021, 3:36 PM

## 2021-12-04 NOTE — Progress Notes (Signed)
Speech Language Pathology Daily Session Note  Patient Details  Name: Frank Warner MRN: [id number] Date of Birth: [birth year]  Today's Date: 12/04/2021 SLP Individual Time: 0730-0830 SLP Individual Time Calculation (min): 60 min  Short Term Goals: Week 1: SLP Short Term Goal 1 (Week 1): Patient will consume current diet with minimal overt s/s of aspiration with mod A verbal cues for use of swallowing compensatory strategies. SLP Short Term Goal 2 (Week 1): Patient will utilize speech intelligibility strategies at the sentence level with overall min A verbal cues to achieve 90% intelligibility. SLP Short Term Goal 3 (Week 1): Patient will demonstrate sustained attention to a functional task for 10 minutes with Mod verbal cues for redirection. SLP Short Term Goal 4 (Week 1): Patient will demonstrate functional problem solving for basic and familiar tasks with Mod A verbal cues. SLP Short Term Goal 5 (Week 1): Patient will identify 2-3 areas of impairment and how they may impact ability to perform ADLs/iADLs to increase awareness of deficits with modA verbal cues SLP Short Term Goal 6 (Week 1): Patient will recall new, daily information with Mod A verbal and visual cues.  Skilled Therapeutic Interventions: Pt seen for skilled ST with focus on swallowing and cognitive goals, pt in bed sleeping but rouses easily to knock and voice. For first 10 minutes, pt mostly non-verbal and minimally interactive with SLP despite max encouragement to eat AM meal. Eventually, pt consuming 100% of Dys 2 meal with thin liquid via straw with occasional cough noted after swallow. Pt required mod-max A cues throughout meal to reduce bolus size and rate of intake. Pt with minimal L anterior loss of solids with inconsistent awareness. SLP providing Dys 3 trials with no significant difference in oral or pharyngeal phase of swallow, however pt more at risk with these textures due to impulsivity and poor self-monitoring. Pt  observed with pills whole in applesauce with thin liquid wash with no difficulty or s/s aspiration. Throughout session, pt with significantly delayed responses to questions and externally distracted. Pt did report "I'm not a morning person", will benefit from later therapy schedule. SLP faciltiating functional problem solving during session by providing overall mod A cues (holding water cup while taking meds, positioning applesauce cup to reduce spill risk, etc). Pt left in bed with alarm set and all needs within reach, cont ST POC.   Pain Pain Assessment Pain Scale: 0-10 Pain Score: 0-No pain Pain Location: Arm Pain Orientation: Right Pain Intervention(s): Medication (See eMAR)  Therapy/Group: Individual Therapy  Dewaine Conger 12/04/2021, 8:23 AM

## 2021-12-04 NOTE — Progress Notes (Signed)
Pt has a RUA TL PICC line, placed 11-21-21;  pt no longer receiving iv medications;  recommend removing this line if he no longer requires iv access.  Please advise.  Thank you!

## 2021-12-04 NOTE — Progress Notes (Addendum)
PROGRESS NOTE   Subjective/Complaints: No new complaints this AM. Nursing helping him to bedside commode to try to have a BM.   Review of Systems  Constitutional:  Negative for chills and fever.  Respiratory:  Negative for shortness of breath.   Cardiovascular:  Negative for chest pain.  Gastrointestinal:  Positive for constipation. Negative for abdominal pain, diarrhea, nausea and vomiting.  Neurological:  Negative for headaches.    Objective:   No results found. Recent Labs    12/03/21 0430  WBC 15.7*  HGB 14.8  HCT 42.1  PLT 446*    Recent Labs    12/03/21 0430  NA 133*  K 4.1  CL 98  CO2 24  GLUCOSE 95  BUN 14  CREATININE 1.03  CALCIUM 9.9     Intake/Output Summary (Last 24 hours) at 12/04/2021 0951 Last data filed at 12/04/2021 0110 Gross per 24 hour  Intake 300 ml  Output 800 ml  Net -500 ml         Physical Exam: Vital Signs Blood pressure 107/71, pulse 65, temperature 98.1 F (36.7 C), temperature source Oral, resp. rate 16, height 6' (1.829 m), weight 95.7 kg, SpO2 98 %.  General: No apparent distress HEENT: oral mucosa pink and moist, [place], AT, R gaze preference Neck: Supple without JVD or lymphadenopathy Heart: Reg rate and rhythm. No murmurs rubs or gallops Chest: CTA bilaterally without wheezes, rales, or rhonchi; no distress Abdomen: Soft, non-tender, mildly-distended, bowel sounds positive. Extremities: No clubbing, cyanosis, or edema. Pulses are 2+ Psych: Pt's affect is flat Skin: warm and dry  Neurologic exam:  Cognition: AAO to person, place, partially time (year and day, not month); not event.  Language: Mild dysarthria.  Insight: Poor insight into current condition.  Sensation: To light touch intact in BL UEs and LEs  CN: + L facial droop, + tongue deviation, + L shoulder shrug deficit Coordination: No apparent tremors or ataxia.  Spasticity: MAS 0 in all extremities.    Assessment/Plan: 1. Functional deficits which require 3+ hours per day of interdisciplinary therapy in a comprehensive inpatient rehab setting. Physiatrist is providing close team supervision and 24 hour management of active medical problems listed below. Physiatrist and rehab team continue to assess barriers to discharge/monitor patient progress toward functional and medical goals  Care Tool:  Bathing    Body parts bathed by patient: Face   Body parts bathed by helper: Right arm, Left arm, Chest, Abdomen, Buttocks, Right upper leg, Left upper leg, Right lower leg, Left lower leg, Front perineal area     Bathing assist Assist Level: Total Assistance - Patient < 25%     Upper Body Dressing/Undressing Upper body dressing   What is the patient wearing?: Pull over shirt    Upper body assist Assist Level: Maximal Assistance - Patient 25 - 49%    Lower Body Dressing/Undressing Lower body dressing      What is the patient wearing?: Pants, Incontinence brief     Lower body assist Assist for lower body dressing: Dependent - Patient 0%     Toileting Toileting    Toileting assist Assist for toileting: Maximal Assistance - Patient 25 - 49%  Transfers Chair/bed transfer  Transfers assist     Chair/bed transfer assist level: Minimal Assistance - Patient > 75%     Locomotion Ambulation   Ambulation assist      Assist level: 2 helpers Assistive device: No Device Max distance: 79ft   Walk 10 feet activity   Assist     Assist level: 2 helpers Assistive device: No Device   Walk 50 feet activity   Assist Walk 50 feet with 2 turns activity did not occur: Safety/medical concerns         Walk 150 feet activity   Assist Walk 150 feet activity did not occur: Safety/medical concerns         Walk 10 feet on uneven surface  activity   Assist Walk 10 feet on uneven surfaces activity did not occur: Safety/medical concerns          Wheelchair     Assist Is the patient using a wheelchair?: Yes (Pt transported to and from main therapy gym via TIS WC for time management.) Type of Wheelchair: Manual    Wheelchair assist level: Dependent - Patient 0% Max wheelchair distance: 150    Wheelchair 50 feet with 2 turns activity    Assist        Assist Level: Dependent - Patient 0%   Wheelchair 150 feet activity     Assist      Assist Level: Dependent - Patient 0%   Blood pressure 107/71, pulse 65, temperature 98.1 F (36.7 C), temperature source Oral, resp. rate 16, height 6' (1.829 m), weight 95.7 kg, SpO2 98 %.  Medical Problem List and Plan: 1. 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  Antithrombotics: -DVT/anticoagulation:  Pharmaceutical: Lovenox             -antiplatelet therapy: DAPT x 3 weeks (Plavix thru 10/25) followed by ASA alone.  3. Pain Management: Tylenol prn.  4. Mood/Behavior/Sleep: LCSW to follow for evaluation and support.              -antipsychotic agents: N/A 5. Neuropsych/cognition: This patient is not capable of making decisions on his own behalf. 6. Skin/Wound Care: routine pressure relief measures.              --continue supplements TID 7. Fluids/Electrolytes/Nutrition: Monitor I/O. Check lytes in am.             --will add Kdur X 2 days to prevent recurrent hypokalemia  -Recheck monday 8. Seizures: Continue Keppra 1500 mg BID             -- GTC at home on presentation, cEEG 9/22-23 showed no epileptiform discharges.  9. ABLA: Recheck CBC in am  -HGB 14.8 on 10/6, stable  -Recheck monday 10. HTN: Monitor BP TID--controlled off meds  -10/7 well controlled 11. Hyperlipidemia: LDL 162--on  Crestor 40 mg/day 12. Leucocytosis: Downtrending, 15. Monitor trend. Fevers resolving.  -Current workup: CXR neg 9/30, UA negative, Lower extremity doppler negative for DVT  -WBC stable at 15.7 10/6  -Recheck Monday, no signs or symptoms of infection 13.  Intermittent Incontinence: Monitor initial at rehab.         -- LBM 9/26 per chart  -10/7 Will order sorbitol due to constipation  10/8 pt feels like he is about to have BM today  LOS: 2 days A FACE TO FACE EVALUATION WAS PERFORMED  Fanny Dance 12/04/2021, 9:51 AM

## 2021-12-05 DIAGNOSIS — E876 Hypokalemia: Secondary | ICD-10-CM

## 2021-12-05 MED ORDER — LEVETIRACETAM 100 MG/ML PO SOLN
1500.0000 mg | Freq: Two times a day (BID) | ORAL | Status: DC
  Administered 2021-12-05 – 2021-12-22 (×35): 1500 mg via ORAL
  Filled 2021-12-05 (×38): qty 15

## 2021-12-05 MED ORDER — SORBITOL 70 % SOLN
60.0000 mL | Freq: Once | Status: AC
  Administered 2021-12-05: 60 mL via ORAL
  Filled 2021-12-05: qty 60

## 2021-12-05 NOTE — IPOC Note (Signed)
Overall Plan of Care [facility]) Patient Details Name: Frank Warner MRN: [id number] DOB: 14-May-1980  Admitting Diagnosis: Acute ischemic left middle cerebral artery (MCA) stroke [facility])  Hospital Problems: Principal Problem:   Acute ischemic left middle cerebral artery (MCA) stroke ([place])     Functional Problem List: Nursing Bowel, Medication Management, Safety, Endurance, Pain  PT Balance, Behavior, Edema, Endurance, Motor, Nutrition, Pain, Perception, Safety, Sensory, Skin Integrity  OT Balance, Behavior, Cognition, Endurance, Motor, Perception, Safety, Sensory  SLP Cognition, Linguistic, Motor, Behavior, Safety, Nutrition, Sensory, Perception  TR         Basic ADL's: OT Grooming, Bathing, Dressing, Toileting, Eating     Advanced  ADL's: OT       Transfers: PT Bed Mobility, Bed to Chair, Teacher, early years/pre, Tub/Shower     Locomotion: PT Ambulation, Stairs     Additional Impairments: OT Fuctional Use of Upper Extremity  SLP Communication, Swallowing, Social Cognition expression Memory, Problem Solving, Awareness, Attention, Social Interaction  TR      Anticipated Outcomes Item Anticipated Outcome  Self Feeding    Swallowing  sup A   Basic self-care  Min A  Toileting  SBA   Bathroom Transfers SBA  Bowel/Bladder  manage bowel w mod I assist  Transfers  SPV  Locomotion  CGA  Communication  mod I  Cognition  sup-to-min A  Pain  < 4 with prns  Safety/Judgment  manage w cues   Therapy Plan: PT Intensity: Minimum of 1-2 x/day ,45 to 90 minutes PT Frequency: 5 out of 7 days PT Duration Estimated Length of Stay: 2.5-3 weeks OT Intensity: Minimum of 1-2 x/day, 45 to 90 minutes OT Frequency: 5 out of 7 days OT Duration/Estimated Length of Stay: 2-3 weeks SLP Intensity: Minumum of 1-2 x/day, 30 to 90 minutes SLP Frequency: 3 to 5 out of 7 days SLP Duration/Estimated Length of Stay: 3 weeks   Team Interventions: Nursing Interventions Disease Management/Prevention,  Medication Management, Discharge Planning, Pain Management, Bowel Management, Patient/Family Education  PT interventions Ambulation/gait training, Cognitive remediation/compensation, Discharge planning, DME/adaptive equipment instruction, Functional mobility training, Pain management, Psychosocial support, Splinting/orthotics, Therapeutic Activities, UE/LE Strength taining/ROM, Visual/perceptual remediation/compensation, Wheelchair propulsion/positioning, UE/LE Coordination activities, Therapeutic Exercise, Stair training, Skin care/wound management, Patient/family education, Neuromuscular re-education, Functional electrical stimulation, Disease management/prevention, Academic librarian, Training and development officer  OT Interventions Training and development officer, Cognitive remediation/compensation, Academic librarian, Discharge planning, Functional mobility training, Psychosocial support, Therapeutic Activities, Visual/perceptual remediation/compensation, UE/LE Coordination activities, Splinting/orthotics, Patient/family education, Functional electrical stimulation, DME/adaptive equipment instruction, UE/LE Strength taining/ROM, Therapeutic Exercise, Self Care/advanced ADL retraining, Neuromuscular re-education, Wheelchair propulsion/positioning  SLP Interventions Cognitive remediation/compensation, Environmental controls, Internal/external aids, Speech/Language facilitation, Oral motor exercises, Cueing hierarchy, Therapeutic Exercise, Dysphagia/aspiration precaution training, Functional tasks, Patient/family education, Therapeutic Activities  TR Interventions    SW/CM Interventions Discharge Planning, Psychosocial Support, Patient/Family Education, Disease Management/Prevention   Barriers to Discharge MD  Medical stability, Home enviroment access/loayout, and Incontinence  Nursing Decreased caregiver support, Home environment access/layout 1 level 3 ste bil rails w mom, GF and was working at  [employer]  PT Inaccessible home environment    OT Behavior, Incontinence decreased awareness of deficits, short term memory problems, difficulty retaining education  SLP Behavior, Nutrition means impulsive, poor frustration tolerance  SW Insurance for SNF coverage, Lack of/limited family support, Decreased caregiver support     Team Discharge Planning: Destination: PT-Home ,OT- Home , SLP-Home Projected Follow-up: PT-Outpatient PT, 24 hour supervision/assistance, OT-  Outpatient OT, SLP-24 hour supervision/assistance, Outpatient SLP, Home Health SLP Projected  Equipment Needs: PT-To be determined, OT- 3 in 1 bedside comode, Tub/shower bench, SLP-None recommended by SLP Equipment Details: PT- , OT-  Patient/family involved in discharge planning: PT- Patient,  OT-Patient, SLP-Patient, Other (Comment) (friend)  MD ELOS: 10-14 days Medical Rehab Prognosis:  Good Assessment: The patient has been admitted for CIR therapies with the diagnosis of 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 The team will be addressing functional mobility, strength, stamina, balance, safety, adaptive techniques and equipment, self-care, bowel and bladder mgt, patient and caregiver education. Goals have been set at [facility] PT, CGA OT, min assist SLP. Anticipated discharge destination is Home.           See Team Conference Notes for weekly updates to the plan of care

## 2021-12-05 NOTE — Progress Notes (Signed)
PROGRESS NOTE   Subjective/Complaints: He has not had BM in several days. He says his Bms are irregular and infrequent at home. No  additional concerns this AM.  No nausea or abdominal pain.  Review of Systems  Constitutional:  Negative for chills and fever.  HENT:  Negative for congestion.   Eyes:  Negative for double vision.  Respiratory:  Negative for shortness of breath.   Cardiovascular:  Negative for chest pain.  Gastrointestinal:  Negative for abdominal pain, constipation, diarrhea, nausea and vomiting.  Neurological:  Positive for weakness. Negative for headaches.    Objective:   No results found. Recent Labs    12/03/21 0430  WBC 15.7*  HGB 14.8  HCT 42.1  PLT 446*    Recent Labs    12/03/21 0430  NA 133*  K 4.1  CL 98  CO2 24  GLUCOSE 95  BUN 14  CREATININE 1.03  CALCIUM 9.9     Intake/Output Summary (Last 24 hours) at 12/05/2021 1350 Last data filed at 12/05/2021 1443 Gross per 24 hour  Intake 480 ml  Output --  Net 480 ml         Physical Exam: Vital Signs Blood pressure 104/74, pulse (!) 58, temperature 98.5 F (36.9 C), resp. rate 17, height 6' (1.829 m), weight 95.7 kg, SpO2 98 %.  General: No apparent distress HEENT: oral mucosa pink and moist, [place], AT, R gaze preference Neck: Supple without JVD or lymphadenopathy Heart: Reg rate and rhythm. No murmurs rubs or gallops Chest: CTA bilaterally without wheezes, rales, or rhonchi; no distress Abdomen: Soft, non-tender, mildly-distended, bowel sounds positive. Extremities: No clubbing, cyanosis, or edema. Pulses are 2+ Psych: Pt's affect is flat Skin: warm and dry  Neurologic exam:  Cognition: AAO to person, place, partially time (year and day, not month); not event.  Language: Mild dysarthria.  Insight: Poor insight into current condition.  Sensation: To light touch intact in BL UEs and LEs  CN: + L facial droop, + tongue  deviation, + L shoulder shrug deficit Strength 5/5 RUE and RLE, 4 to 4+/5 LLE and 0-1 LUE  Assessment/Plan: 1. Functional deficits which require 3+ hours per day of interdisciplinary therapy in a comprehensive inpatient rehab setting. Physiatrist is providing close team supervision and 24 hour management of active medical problems listed below. Physiatrist and rehab team continue to assess barriers to discharge/monitor patient progress toward functional and medical goals  Care Tool:  Bathing    Body parts bathed by patient: Face   Body parts bathed by helper: Right arm, Left arm, Chest, Abdomen, Buttocks, Right upper leg, Left upper leg, Right lower leg, Left lower leg, Front perineal area     Bathing assist Assist Level: Total Assistance - Patient < 25%     Upper Body Dressing/Undressing Upper body dressing   What is the patient wearing?: Pull over shirt    Upper body assist Assist Level: Maximal Assistance - Patient 25 - 49%    Lower Body Dressing/Undressing Lower body dressing      What is the patient wearing?: Pants, Incontinence brief     Lower body assist Assist for lower body dressing: Dependent - Patient  0%     Toileting Toileting    Toileting assist Assist for toileting: Maximal Assistance - Patient 25 - 49%     Transfers Chair/bed transfer  Transfers assist     Chair/bed transfer assist level: Minimal Assistance - Patient > 75%     Locomotion Ambulation   Ambulation assist      Assist level: 2 helpers Assistive device: No Device Max distance: 30ft   Walk 10 feet activity   Assist     Assist level: 2 helpers Assistive device: No Device   Walk 50 feet activity   Assist Walk 50 feet with 2 turns activity did not occur: Safety/medical concerns         Walk 150 feet activity   Assist Walk 150 feet activity did not occur: Safety/medical concerns         Walk 10 feet on uneven surface  activity   Assist Walk 10 feet on  uneven surfaces activity did not occur: Safety/medical concerns         Wheelchair     Assist Is the patient using a wheelchair?: Yes (Pt transported to and from main therapy gym via TIS WC for time management.) Type of Wheelchair: Manual    Wheelchair assist level: Dependent - Patient 0% Max wheelchair distance: 150    Wheelchair 50 feet with 2 turns activity    Assist        Assist Level: Dependent - Patient 0%   Wheelchair 150 feet activity     Assist      Assist Level: Dependent - Patient 0%   Blood pressure 104/74, pulse (!) 58, temperature 98.5 F (36.9 C), resp. rate 17, height 6' (1.829 m), weight 95.7 kg, SpO2 98 %.  Medical Problem List and Plan: 1. 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  Antithrombotics: -DVT/anticoagulation:  Pharmaceutical: Lovenox             -antiplatelet therapy: DAPT x 3 weeks (Plavix thru 10/25) followed by ASA alone.  3. Pain Management: Tylenol prn.  4. Mood/Behavior/Sleep: LCSW to follow for evaluation and support.              -antipsychotic agents: N/A 5. Neuropsych/cognition: This patient is not capable of making decisions on his own behalf. 6. Skin/Wound Care: routine pressure relief measures.              --continue supplements TID 7. Fluids/Electrolytes/Nutrition: Monitor I/O. Check lytes in am.             --will add Kdur X 2 days to prevent recurrent hypokalemia  -Recheck tomorrow 8. Seizures: Continue Keppra 1500 mg BID             -- GTC at home on presentation, cEEG 9/22-23 showed no epileptiform discharges.  9. ABLA: Recheck CBC in am  -HGB 14.8 on 10/6, stable  -CBC tomorrow 10. HTN: Monitor BP TID--controlled off  meds  -10/8 Well controlled 11. Hyperlipidemia: LDL 162--on Crestor 40 mg/day 12. Leucocytosis: Downtrending, 15. Monitor trend. Fevers resolving.  -Current workup: CXR neg 9/30,  UA negative, Lower extremity doppler negative for DVT  -WBC stable at 15.7 10/6  -Recheck Monday, no signs or symptoms of infection 13. Intermittent Incontinence: Monitor initial at rehab.         -- LBM 9/30 per chart  -10/8 Order sorbitol 60mg    LOS: 3 days A FACE TO FACE EVALUATION WAS PERFORMED  12/05/2021, 1:50 PM

## 2021-12-06 LAB — CBC
HCT: 42.1 % (ref 39.0–52.0)
Hemoglobin: 14.9 g/dL (ref 13.0–17.0)
MCH: 34.3 pg — ABNORMAL HIGH (ref 26.0–34.0)
MCHC: 35.4 g/dL (ref 30.0–36.0)
MCV: 96.8 fL (ref 80.0–100.0)
Platelets: 532 10*3/uL — ABNORMAL HIGH (ref 150–400)
RBC: 4.35 MIL/uL (ref 4.22–5.81)
RDW: 13.8 % (ref 11.5–15.5)
WBC: 11.4 10*3/uL — ABNORMAL HIGH (ref 4.0–10.5)
nRBC: 0 % (ref 0.0–0.2)

## 2021-12-06 LAB — BASIC METABOLIC PANEL
Anion gap: 13 (ref 5–15)
BUN: 14 mg/dL (ref 6–20)
CO2: 23 mmol/L (ref 22–32)
Calcium: 10.1 mg/dL (ref 8.9–10.3)
Chloride: 98 mmol/L (ref 98–111)
Creatinine, Ser: 1.22 mg/dL (ref 0.61–1.24)
GFR, Estimated: >60 mL/min (ref >60)
Glucose, Bld: 85 mg/dL (ref 70–99)
Potassium: 4 mmol/L (ref 3.5–5.1)
Sodium: 134 mmol/L — ABNORMAL LOW (ref 135–145)

## 2021-12-06 MED ORDER — PANTOPRAZOLE 2 MG/ML SUSPENSION
40.0000 mg | Freq: Every day | ORAL | Status: DC
  Administered 2021-12-07 – 2021-12-22 (×16): 40 mg
  Filled 2021-12-06 (×12): qty 20

## 2021-12-06 NOTE — Progress Notes (Signed)
PROGRESS NOTE   Subjective/Complaints:  No issues overnite, discussed CVA and recovery, pt wants to have pass to go get something from home , discussed that only grounds pass is available and advised pt to have family or friend bring in items from home (pt wants shorts)  Review of Systems  Constitutional:  Negative for chills and fever.  HENT:  Negative for congestion.   Eyes:  Negative for double vision.  Respiratory:  Negative for shortness of breath.   Cardiovascular:  Negative for chest pain.  Gastrointestinal:  Negative for abdominal pain, constipation, diarrhea, nausea and vomiting.  Neurological:  Positive for weakness. Negative for headaches.    Objective:   No results found. Recent Labs    12/06/21 0541  WBC 11.4*  HGB 14.9  HCT 42.1  PLT 532*    Recent Labs    12/06/21 0541  NA 134*  K 4.0  CL 98  CO2 23  GLUCOSE 85  BUN 14  CREATININE 1.22  CALCIUM 10.1     Intake/Output Summary (Last 24 hours) at 12/06/2021 0926 Last data filed at 12/06/2021 0815 Gross per 24 hour  Intake 180 ml  Output 1025 ml  Net -845 ml         Physical Exam: Vital Signs Blood pressure 112/74, pulse 73, temperature 99 F (37.2 C), temperature source Oral, resp. rate 16, height 6' (1.829 m), weight 95.7 kg, SpO2 99 %.   General: No acute distress Mood and affect are appropriate Heart: Regular rate and rhythm no rubs murmurs or extra sounds Lungs: Clear to auscultation, breathing unlabored, no rales or wheezes Abdomen: Positive bowel sounds, soft nontender to palpation, nondistended Extremities: No clubbing, cyanosis, or edema Skin: No evidence of breakdown, no evidence of rash   Neurologic exam:  Cognition: AAO to person, place, partially time (year and day, not month); not event.  Language: Mild dysarthria.  Insight: Poor insight into current condition.  Sensation: To light touch intact in BL UEs and LEs  CN:  + L facial droop, + tongue deviation, + L shoulder shrug deficit Strength 5/5 RUE and RLE, 4 to 4+/5 LLE and 0/5 LUE  Assessment/Plan: 1. Functional deficits which require 3+ hours per day of interdisciplinary therapy in a comprehensive inpatient rehab setting. Physiatrist is providing close team supervision and 24 hour management of active medical problems listed below. Physiatrist and rehab team continue to assess barriers to discharge/monitor patient progress toward functional and medical goals  Care Tool:  Bathing    Body parts bathed by patient: Face   Body parts bathed by helper: Right arm, Left arm, Chest, Abdomen, Buttocks, Right upper leg, Left upper leg, Right lower leg, Left lower leg, Front perineal area     Bathing assist Assist Level: Total Assistance - Patient < 25%     Upper Body Dressing/Undressing Upper body dressing   What is the patient wearing?: Pull over shirt    Upper body assist Assist Level: Maximal Assistance - Patient 25 - 49%    Lower Body Dressing/Undressing Lower body dressing      What is the patient wearing?: Pants, Incontinence brief     Lower body assist Assist for  lower body dressing: Dependent - Patient 0%     Toileting Toileting    Toileting assist Assist for toileting: Maximal Assistance - Patient 25 - 49%     Transfers Chair/bed transfer  Transfers assist     Chair/bed transfer assist level: Minimal Assistance - Patient > 75%     Locomotion Ambulation   Ambulation assist      Assist level: 2 helpers Assistive device: No Device Max distance: 24ft   Walk 10 feet activity   Assist     Assist level: 2 helpers Assistive device: No Device   Walk 50 feet activity   Assist Walk 50 feet with 2 turns activity did not occur: Safety/medical concerns         Walk 150 feet activity   Assist Walk 150 feet activity did not occur: Safety/medical concerns         Walk 10 feet on uneven surface   activity   Assist Walk 10 feet on uneven surfaces activity did not occur: Safety/medical concerns         Wheelchair     Assist Is the patient using a wheelchair?: Yes (Pt transported to and from main therapy gym via TIS WC for time management.) Type of Wheelchair: Manual    Wheelchair assist level: Dependent - Patient 0% Max wheelchair distance: 150    Wheelchair 50 feet with 2 turns activity    Assist        Assist Level: Dependent - Patient 0%   Wheelchair 150 feet activity     Assist      Assist Level: Dependent - Patient 0%   Blood pressure 112/74, pulse 73, temperature 99 F (37.2 C), temperature source Oral, resp. rate 16, height 6' (1.829 m), weight 95.7 kg, SpO2 99 %.  Medical Problem List and Plan: 1. 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  Antithrombotics: -DVT/anticoagulation:  Pharmaceutical: Lovenox             -antiplatelet therapy: DAPT x 3 weeks (Plavix thru 10/25) followed by ASA alone.  3. Pain Management: Tylenol prn.  4. Mood/Behavior/Sleep: LCSW to follow for evaluation and support.              -antipsychotic agents: N/A 5. Neuropsych/cognition: This patient is not capable of making decisions on his own behalf. 6. Skin/Wound Care: routine pressure relief measures.              --continue supplements TID 7. Fluids/Electrolytes/Nutrition: Monitor I/O. Check lytes in am.             --will add Kdur X 2 days to prevent recurrent hypokalemia      Latest Ref Rng & Units 12/06/2021    5:41 AM 12/03/2021    4:30 AM 11/30/2021    4:30 AM  BMP  Glucose 70 - 99 mg/dL 85  95  95   BUN 6 - 20 mg/dL 14  14  16    Creatinine 0.61 - 1.24 mg/dL  2.64  1.58   Sodium 135 -  145 mmol/L 134  133  136   Potassium 3.5 - 5.1 mmol/L 4.0  4.1  3.5   Chloride 98 - 111 mmol/L 98  98  105   CO2 22 - 32 mmol/L 23  24  22    Calcium 8.9 - 10.3 mg/dL 10.1  9.9  9.3     8. Seizures: Continue Keppra 1500 mg BID             -- GTC at home on presentation, cEEG 9/22-23 showed no epileptiform discharges.  9. ABLA: Recheck CBC in am  -HGB 14.8 on 10/6, stable  -CBC tomorrow 10. HTN: Monitor BP TID--controlled off meds Vitals:   12/05/21 1931 12/06/21 0552  BP: 108/68 112/74  Pulse: 78 73  Resp: 16 16  Temp: 98.4 F (36.9 C) 99 F (37.2 C)  SpO2: 99%     11. Hyperlipidemia: LDL 162--on Crestor 40 mg/day 12. Leucocytosis: Downtrending, 15. Monitor trend. Fevers resolving.  -Current workup: CXR neg 9/30, UA negative, Lower extremity doppler negative for DVT  -WBC stable at 15.7 10/6  -Recheck Monday, no signs or symptoms of infection 13. Intermittent Incontinence: Monitor initial at rehab.         -- LBM 9/30 per chart  -10/8 Order sorbitol 60mg    LOS: 4 days A FACE TO Martin E Avalina Benko 12/06/2021, 9:26 AM

## 2021-12-06 NOTE — Progress Notes (Signed)
Occupational Therapy Session Note  Patient Details  Name: Arlyn Buerkle MRN: [id number] Date of Birth: Nov 27, 1980  Today's Date: 12/06/2021 OT Individual Time: 1007-1106 OT Individual Time Calculation (min): 59 min    Short Term Goals: Week 1:  OT Short Term Goal 1 (Week 1): Pt will increase UB dressing while seated on EOB or at sink with Mod A utilizing hemi dressing techniques. OT Short Term Goal 2 (Week 1): Pt will increase LB dressing while seated on EOB or at sink with Mod A utilizing hemi dressing techniques. OT Short Term Goal 3 (Week 1): Pt will increase dynamic sitting balance to SBA while completing self care tasks such as LB dressing and bathing.  Skilled Therapeutic Interventions/Progress Updates:  Pt greeted supine in bed, pt reports fatigue needing + time to arouse but eventually       agreeable to OT intervention. Pt very flat initially but warms up as session progresses. Pt completes supine>sit with CGA. Pt completes stand pivot from EOB>w/c to with [facility]. Total A transport to gym in w/c. Pt completed stand pivot to EOM with MINA. Worked on [employer] with LUE with pt presenting with LUE trace shoulder activation. Placed LUE on ball and worked on [employer] via horizontal shoulder ABD/ADD with needing MIN A at elbow to complete task. Pt also worked on shoulder flexion with pt instructed to roll ball forward/backward with L elbow supported. Pt note to have no awareness to why his LUE isn't moving as well as it used to. Provided stroke education on recovery/rehab, pt seemed to have more awareness but continues to report frustration/embarrassment with current level of function, provided emotional support and therapeutic listening.       Pt also worked on [employer] with LUE support on angled table and pt instructed to push table forward/backward with pt needing support at L elbow and trace movement noted.  Pt completed stand pivot back to w/c impulsively and noted to have mild  LOB to Left needing MIN A to recover. Pt transported back to room with total A with pt left supine in bed with bed alarm activated and all needs within reach.   Provided education on self ROM therex pt can complete in room with pt verbalizing understanding.    Therapy Documentation Precautions:  Precautions Precautions: Fall Precaution Comments: L inattention, L hemiparesis, L lean, hx of seizures Restrictions Weight Bearing Restrictions: No  Pain: no pain     Therapy/Group: Individual Therapy  Precious Haws 12/06/2021, 12:12 PM

## 2021-12-06 NOTE — Progress Notes (Signed)
Speech Language Pathology Daily Session Note  Patient Details  Name: Frank Warner MRN: [id number] Date of Birth: March 27, 1980  Today's Date: 12/06/2021 SLP Individual Time: 1103-1200 SLP Individual Time Calculation (min): 57 min  Short Term Goals: Week 1: SLP Short Term Goal 1 (Week 1): Patient will consume current diet with minimal overt s/s of aspiration with mod A verbal cues for use of swallowing compensatory strategies. SLP Short Term Goal 2 (Week 1): Patient will utilize speech intelligibility strategies at the sentence level with overall min A verbal cues to achieve 90% intelligibility. SLP Short Term Goal 3 (Week 1): Patient will demonstrate sustained attention to a functional task for 10 minutes with Mod verbal cues for redirection. SLP Short Term Goal 4 (Week 1): Patient will demonstrate functional problem solving for basic and familiar tasks with Mod A verbal cues. SLP Short Term Goal 5 (Week 1): Patient will identify 2-3 areas of impairment and how they may impact ability to perform ADLs/iADLs to increase awareness of deficits with modA verbal cues SLP Short Term Goal 6 (Week 1): Patient will recall new, daily information with Mod A verbal and visual cues.  Skilled Therapeutic Interventions: Skilled ST treatment focused on cognitive and speech goals. Pt received in bed on arrival and agreeable to ST intervention. SLP focused today's session on stroke education including types of stroke, right vs. left deficits, locations of the brain/lobes, associated impairments, discussion on PT/OT/ST interventions, and supporting a brain healthy lifestyle. SLP tailored education to reflect pt's stroke/location and discussed impairments observed during evaluation. Pt demonstrated inconsistent intellectual awareness of physical impairments, and severely impaired emergent and anticipatory awareness of how these impairments may impact actions and ability to participate in ADLs/iADLs. Pt with minimal awareness  of speech, swallowing, and cognitive impairments. Pt did acknowledge "drooling" however. SLP provided handouts supporting each educational topics discussed today and placed in stroke binder. Pt reported requesting a grounds pass so he could go to [employer]. Pt stated he feels that he would be able to walk to his wheelchair, get into his car, drive himself to the store, and navigate through the store in a motorized wheelchair at an independent level. When SLP challenged reasoning/logic, pt reported he didn't "think through" not having a car at the hospital. Pt went on to say he would still be able to drive as long as someone dropped off his car. Pt/SLP discussed essential safety considerations with current level of deficits and how this would not be medically safe nor approved by hospital staff. Pt demonstrating incomplete understanding, however open to having this discussion. Pt was seen consuming pureed textures and thin liquids during session with improved oral containment and without overt s/sx of aspiration. Pt continues to exhibit poor secretion management with minimal awareness. Pt educated to increase frequency of swallows to reduce amount of saliva pooling in L buccal cavity. Patient was left in bed with alarm activated and immediate needs within reach at end of session. Continue per current plan of care.       Pain Pain Assessment Pain Scale: 0-10 Pain Score: 0-No pain  Therapy/Group: Individual Therapy  Jomel Whittlesey T Aleese Kamps 12/06/2021, 11:02 AM

## 2021-12-06 NOTE — Progress Notes (Signed)
Physical Therapy Session Note  Patient Details  Name: Frank Warner MRN: [id number] Date of Birth: 04/12/1980  Today's Date: 12/06/2021 PT Individual Time: 0800-0910 PT Individual Time Calculation (min): 70 min   Short Term Goals: Week 1:  PT Short Term Goal 1 (Week 1): Pt will perform bed mobility with CGA. PT Short Term Goal 2 (Week 1): Pt will perform sit<> stand transfer with CGA consistently and LRAD. PT Short Term Goal 3 (Week 1): Pt will perform bed<>chair transfer with CGA consistently and LRAD. PT Short Term Goal 4 (Week 1): Pt will amb 90 ft with mod assist +1 consistenly and LRAD. PT Short Term Goal 5 (Week 1): Pt will attempt stairs.  Skilled Therapeutic Interventions/Progress Updates:       Pt in bed on arrival with NT at bedside. Pt awake and agreeable to therapy session with encouragement. New brief donned for cleanliness with +2 assist for time management. Pt rolls to his R with modA and to his L with maxA. Shorts applied at bed level with +2 assist for time as well.   Supine<>sitting EOB with minA for LUE as pt unaware of position during mobility. HOB nearly flat and use of hospital bed rails. Sitting balance with close SBA. Stand<>pivot transfer with minA towards his weaker L side. Pt reporting urgent need to void - urinal provided for urgency and pt continent of bladder - charted in flowsheets.   Pt requesting to brush his teeth at the sink - completed while seated in w/c and required setupA for opening toothpaste and tooth brush. Delayed initiation noted during tasks and pt with some motor perseveration while brushing teeth - cues needed to terminate task.   Transported in TIS w/c to day room rehab gym. Focused remainder of session on LiteGait training overground with partial BWS. Harness donned/doffed in sitting for safety. Paretic LUE ace wrapped to LiteGait to limit distractions and promote weight bearing.   Sit<>stand to [facility] with CGA. Ambulated >435ft in LiteGait  with above described setup. Assist level fluctuated b/w minA to modA depending on fatigue, attention, and distractions. Pt able to manage LLE throughout gait cycle without external assist - inconsistent step lengths but improves when cued to focus. L knee flexed in stance but no buckling observed. Trunk support provided to prevent L trunk lean. Gait trial completed without seated rest break but he did have a few standing rest breaks due to distractions > fatigue.   Pt returned to his room and stand<>pivot transfer back to bed with minA. Pt lying perpendicular in bed and required assist for initiating safety with lying symmetrical in bed. minA for LUE management. Alarm on, call bell in reach.   Therapy Documentation Precautions:  Precautions Precautions: Fall Precaution Comments: L inattention, L hemiparesis, L lean, hx of seizures Restrictions Weight Bearing Restrictions: No General:     Therapy/Group: Individual Therapy  Jerrilyn Messinger P Daris Aristizabal PT 12/06/2021, 7:44 AM

## 2021-12-07 NOTE — Progress Notes (Signed)
PROGRESS NOTE   Subjective/Complaints:   Discussed effect of stroke , pt have NM re ed with OT   Review of Systems  Constitutional:  Negative for chills and fever.  HENT:  Negative for congestion.   Eyes:  Negative for double vision.  Respiratory:  Negative for shortness of breath.   Cardiovascular:  Negative for chest pain.  Gastrointestinal:  Negative for abdominal pain, constipation, diarrhea, nausea and vomiting.  Neurological:  Positive for weakness. Negative for headaches.    Objective:   No results found. Recent Labs    12/06/21 0541  WBC 11.4*  HGB 14.9  HCT 42.1  PLT 532*    Recent Labs    12/06/21 0541  NA 134*  K 4.0  CL 98  CO2 23  GLUCOSE 85  BUN 14  CREATININE 1.22  CALCIUM 10.1     Intake/Output Summary (Last 24 hours) at 12/07/2021 0833 Last data filed at 12/06/2021 2035 Gross per 24 hour  Intake 220 ml  Output 700 ml  Net -480 ml         Physical Exam: Vital Signs Blood pressure 103/72, pulse (!) 59, temperature 98.5 F (36.9 C), temperature source Oral, resp. rate 15, height 6' (1.829 m), weight 95.7 kg, SpO2 97 %.   General: No acute distress Mood and affect are appropriate Heart: Regular rate and rhythm no rubs murmurs or extra sounds Lungs: Clear to auscultation, breathing unlabored, no rales or wheezes Abdomen: Positive bowel sounds, soft nontender to palpation, nondistended Extremities: No clubbing, cyanosis, or edema Skin: No evidence of breakdown, no evidence of rash   Neurologic exam:  Cognition: AAO to person, place, partially time (+ month, - day/date); Language: Mild dysarthria.  Insight: Poor insight into current condition.  Sensation: To light touch intact in BL UEs and LEs  CN: + L facial droop, + tongue deviation, + L shoulder shrug deficit Strength 5/5 RUE and RLE, 4 to 4+/5 LLE and 2- Elbow flexors o/w 0/ 5 LUE  Assessment/Plan: 1. Functional  deficits which require 3+ hours per day of interdisciplinary therapy in a comprehensive inpatient rehab setting. Physiatrist is providing close team supervision and 24 hour management of active medical problems listed below. Physiatrist and rehab team continue to assess barriers to discharge/monitor patient progress toward functional and medical goals  Care Tool:  Bathing    Body parts bathed by patient: Face   Body parts bathed by helper: Right arm, Left arm, Chest, Abdomen, Buttocks, Right upper leg, Left upper leg, Right lower leg, Left lower leg, Front perineal area     Bathing assist Assist Level: Total Assistance - Patient < 25%     Upper Body Dressing/Undressing Upper body dressing   What is the patient wearing?: Pull over shirt    Upper body assist Assist Level: Maximal Assistance - Patient 25 - 49%    Lower Body Dressing/Undressing Lower body dressing      What is the patient wearing?: Pants, Incontinence brief     Lower body assist Assist for lower body dressing: Dependent - Patient 0%     Toileting Toileting    Toileting assist Assist for toileting: Maximal Assistance - Patient 25 -  49%     Transfers Chair/bed transfer  Transfers assist     Chair/bed transfer assist level: Minimal Assistance - Patient > 75% (stand pivot with no AD)     Locomotion Ambulation   Ambulation assist      Assist level: 2 helpers Assistive device: No Device Max distance: 67ft   Walk 10 feet activity   Assist     Assist level: 2 helpers Assistive device: No Device   Walk 50 feet activity   Assist Walk 50 feet with 2 turns activity did not occur: Safety/medical concerns         Walk 150 feet activity   Assist Walk 150 feet activity did not occur: Safety/medical concerns         Walk 10 feet on uneven surface  activity   Assist Walk 10 feet on uneven surfaces activity did not occur: Safety/medical concerns          Wheelchair     Assist Is the patient using a wheelchair?: Yes (Pt transported to and from main therapy gym via TIS WC for time management.) Type of Wheelchair: Manual    Wheelchair assist level: Dependent - Patient 0% Max wheelchair distance: 150    Wheelchair 50 feet with 2 turns activity    Assist        Assist Level: Dependent - Patient 0%   Wheelchair 150 feet activity     Assist      Assist Level: Dependent - Patient 0%   Blood pressure 103/72, pulse (!) 59, temperature 98.5 F (36.9 C), temperature source Oral, resp. rate 15, height 6' (1.829 m), weight 95.7 kg, SpO2 97 %.  Medical Problem List and Plan: 1. 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  Antithrombotics: -DVT/anticoagulation:  Pharmaceutical: Lovenox             -antiplatelet therapy: DAPT x 3 weeks (Plavix thru 10/25) followed by ASA alone.  3. Pain Management: Tylenol prn.  4. Mood/Behavior/Sleep: LCSW to follow for evaluation and support.              -antipsychotic agents: N/A 5. Neuropsych/cognition: This patient is not capable of making decisions on his own behalf. 6. Skin/Wound Care: routine pressure relief measures.              --continue supplements TID 7. Fluids/Electrolytes/Nutrition: Monitor I/O. Check lytes in am.             --will add Kdur X 2 days to prevent recurrent hypokalemia      Latest Ref Rng & Units 12/06/2021    5:41 AM 12/03/2021    4:30 AM 11/30/2021    4:30 AM  BMP  Glucose 70 - 99 mg/dL 85  95  95   BUN 6 - 20 mg/dL 14  14  16    Creatinine 0.61 - 1.24 mg/dL  5.46  5.68   Sodium 135 - 145 mmol/L 134  133  136   Potassium 3.5 - 5.1 mmol/L 4.0  4.1  3.5   Chloride 98 -  111  mmol/L 98  98  105   CO2 22 - 32 mmol/L 23  24  22    Calcium 8.9 - 10.3 mg/dL  9.9  9.3     8. Seizures: Continue Keppra 1500 mg BID             -- GTC at home on presentation, cEEG 9/22-23 showed no epileptiform discharges.  9. ABLA: Resolved 10. HTN: Monitor BP TID--controlled off meds Vitals:   12/06/21 1956 12/07/21 0536  BP: 113/78 103/72  Pulse: 75 (!) 59  Resp: 18 15  Temp: 98.7 F (37.1 C) 98.5 F (36.9 C)  SpO2: 98% 97%   Hx HTN but BPs ok, other CVA risk factors hx of smoking , ETOH  11. Hyperlipidemia: LDL 162--on Crestor 40 mg/day 12. Leucocytosis: Downtrending, 15. Monitor trend. Fevers resolving.  -Current workup: CXR neg 9/30, UA negative, Lower extremity doppler negative for DVT  -WBC stable at 15.7 10/6  -Recheck Monday, no signs or symptoms of infection 13. Intermittent Incontinence: Monitor initial at rehab.         -- LBM 9/30 per chart  -10/8 Order sorbitol 60mg    LOS: 5 days A FACE TO FACE EVALUATION WAS PERFORMED  12/8 12/07/2021, 8:33 AM

## 2021-12-07 NOTE — Progress Notes (Signed)
Speech Language Pathology Daily Session Note  Patient Details  Name: Frank Warner MRN: [id number] Date of Birth: Feb 03, 1981  Today's Date: 12/07/2021 SLP Individual Time: 3845-3646 SLP Individual Time Calculation (min): 46 min  Short Term Goals: Week 1: SLP Short Term Goal 1 (Week 1): Patient will consume current diet with minimal overt s/s of aspiration with mod A verbal cues for use of swallowing compensatory strategies. SLP Short Term Goal 2 (Week 1): Patient will utilize speech intelligibility strategies at the sentence level with overall min A verbal cues to achieve 90% intelligibility. SLP Short Term Goal 3 (Week 1): Patient will demonstrate sustained attention to a functional task for 10 minutes with Mod verbal cues for redirection. SLP Short Term Goal 4 (Week 1): Patient will demonstrate functional problem solving for basic and familiar tasks with Mod A verbal cues. SLP Short Term Goal 5 (Week 1): Patient will identify 2-3 areas of impairment and how they may impact ability to perform ADLs/iADLs to increase awareness of deficits with modA verbal cues SLP Short Term Goal 6 (Week 1): Patient will recall new, daily information with Mod A verbal and visual cues.  Skilled Therapeutic Interventions: Skilled ST treatment focused on cognitive goals. Pt greeted in bed on arrival, sleepy, but agreeable to ST intervention. Pt with decreased speech intelligibility secondary to reduced vocal intensity and articulatory precision. When SLP attempted to address, pt stated he isn't very "talkative" and "quiet" at baseline. Pt stated "I don't care about it" (his current speech) and does not have any interest in addressing goals, even if others are unable to understand him well. Will focus on cognitive and swallowing goals at this time. SLP facilitated session by providing mod A verbal redirection cues for sustain attention to a cognitive task for 3-5 minute duration, max A verbal redirection cues required to  sustain attention to task for 26 minute duration. Pt generated words within a category per each letter of the alphabet with mod A for working memory. Pt exhibited improved intellectual awareness of attention and memory impairments, however this appears to fluctuate with changing contexts. SLP provided education on attention and memory strategies. Discussed consideration of memory notebook for reinforcement with day-to-day recall. Pt seemed uninterested. Will continue to pursue additional strategies and re-discuss interest in upcoming session(s). Of note - pt exhibited congested cough throughout session which was not observed during previous sessions. Pt reports chronic cough at baseline likely secondary to smoking. SLP reported increased congestion to pt's nurse. Patient was left in bed with alarm activated and immediate needs within reach at end of session. Continue per current plan of care.       Pain  None/denied  Therapy/Group: Individual Therapy  Patty Sermons 12/07/2021, 6:14 PM

## 2021-12-07 NOTE — Progress Notes (Signed)
Occupational Therapy Session Note  Patient Details  Name: Frank Warner MRN: [id number] Date of Birth: 11-02-1980  Today's Date: 12/07/2021 OT Individual Time: 1001-1108 OT Individual Time Calculation (min): 67 min    Short Term Goals: Week 1:  OT Short Term Goal 1 (Week 1): Pt will increase UB dressing while seated on EOB or at sink with Mod A utilizing hemi dressing techniques. OT Short Term Goal 2 (Week 1): Pt will increase LB dressing while seated on EOB or at sink with Mod A utilizing hemi dressing techniques. OT Short Term Goal 3 (Week 1): Pt will increase dynamic sitting balance to SBA while completing self care tasks such as LB dressing and bathing.  Skilled Therapeutic Interventions/Progress Updates:  Pt greeted seated in w/c, pt agreeable to OT intervention. Session focus on BADL reeducation, functional mobility, dynamic standing balance and decreasing overall caregiver burden.  Pt requests yogurt prior to session, retrieved yogurt and assisted pt with self feeding with pt able to self feed with RUE with OTA assisting with positioning/stabilizing yogurt. Pt then agreeable to shower. Pt stands impulsively from w/c with OTA asking pt to sit down, pt becomes frustrating stating," y'all want me to walk but then dont let me walk." Education provided on completing transfers safely and with assistance. Did have pt walk into bathroom with pt needing heavy MIN A d/t L lean however pt reports no deficits. Pt completed showering with overall MINA, needing assist to wash back, LUE and buttock in standing needing assist for balance support. Pt instructed to step out to [facility] for dressing, with pt stating" youre trying to make me fall." Education provided on only trying to get pt out of wet shower so dressing would be easier. Pt preferred to dress from shower, pt needed MOD A for UB Dressing, MOD A for LB dressing and total A for socks. Education provided on hemi techniques with little carryover noted. Pt  ambulated out of bathroom with MINA  with pt left supine in bed with all needs within reach and bed alarm activated.                 Therapy Documentation Precautions:  Precautions Precautions: Fall Precaution Comments: L inattention, L hemiparesis, L lean, hx of seizures Restrictions Weight Bearing Restrictions: No   Pain: no pain reported during session    Therapy/Group: Individual Therapy  Precious Haws 12/07/2021, 12:23 PM

## 2021-12-07 NOTE — Progress Notes (Signed)
Physical Therapy Session Note  Patient Details  Name: Frank Warner MRN: [id number] Date of Birth: 05/04/1980  Today's Date: 12/07/2021 PT Individual Time: 1135-1205 PT Individual Time Calculation (min): 30 min   Short Term Goals: Week 1:  PT Short Term Goal 1 (Week 1): Pt will perform bed mobility with CGA. PT Short Term Goal 2 (Week 1): Pt will perform sit<> stand transfer with CGA consistently and LRAD. PT Short Term Goal 3 (Week 1): Pt will perform bed<>chair transfer with CGA consistently and LRAD. PT Short Term Goal 4 (Week 1): Pt will amb 90 ft with mod assist +1 consistenly and LRAD. PT Short Term Goal 5 (Week 1): Pt will attempt stairs.  Skilled Therapeutic Interventions/Progress Updates:     Patient in bed upon PT arrival. Patient alert and agreeable to PT session. Patient denied pain during session, reports increased fatigue this morning and poor sleep quality last night.  Focused part of session on patient education due to fatigue. Educated on stroke causes, life style and diet modifications, opportunities for exercise with daily activities and salient activities to promote long term consistency, and coping strategies due to current loss of independence and motor function. Patient receptive to education and will need continued reinforcement.    Patient performed the following L hemi-body motor control activities: -L elbow flexion/extension AAROM with PNF tapping x10 -L shoulder ER progressive stretch in scapular plane, followed by active contraction and AAROM for shoulder IR x10 -wrist flexion/extension AAROM x10 with cues for finger flexion with natural tenodesis grip in wrist flexion -bridging with approximation of L foot x10 focused on equal hip extension -hip/knee flexion/extension against resistance focused on active muscle contraction x10  Patient in bed at end of session with breaks locked, bed alarm set, and all needs within reach.   Therapy  Documentation Precautions:  Precautions Precautions: Fall Precaution Comments: L inattention, L hemiparesis, L lean, hx of seizures Restrictions Weight Bearing Restrictions: No    Therapy/Group: Individual Therapy  Meshell Abdulaziz L Cortney Mckinney PT, DPT, NCS, CBIS  12/07/2021, 12:11 PM

## 2021-12-07 NOTE — Progress Notes (Signed)
Occupational Therapy Session Note  Patient Details  Name: Frank Warner MRN: [id number] Date of Birth: 09/15/1980  Today's Date: 12/07/2021 OT Individual Time: 3570-1779 OT Individual Time Calculation (min): 45 min    Short Term Goals: Week 1:  OT Short Term Goal 1 (Week 1): Pt will increase UB dressing while seated on EOB or at sink with Mod A utilizing hemi dressing techniques. OT Short Term Goal 2 (Week 1): Pt will increase LB dressing while seated on EOB or at sink with Mod A utilizing hemi dressing techniques. OT Short Term Goal 3 (Week 1): Pt will increase dynamic sitting balance to SBA while completing self care tasks such as LB dressing and bathing. Week 2:     Skilled Therapeutic Interventions/Progress Updates:    1:1 Pt received in the bed asleep but easy to arouse. Focus on active left UE movement in the bed in supine position; positioned UE abducted away from the bed out to the side with focus on shoulder adduction and bicep flexion. Pt with difficulty with attending to UE and sustaining attention to tasks. Continued to discuss reason why had a stroke and impact of that stroke- pt continues to demonstrate decr intellectual awareness. Pt came to EOB on the left side with min to mod A with facilitation of activation in trunk. Sat EOB to take meds with RN  with cues for attention to anterior spillage. Pt transferred from EOB to w/c with min A stand pivot. Transferred to toilet with BSC over the toilet again with min A with max A for toileting (pt did have difficulty controlling urine until he sat down - voiding on LE and floor. Pt required A to clean up and wash left side of periarea. At the sink performed oral care with min A with cues for attention to anterior spillage. Engaged in eating breakfast again with setup and cues for anterior spillage. Pt left sitting up in the w/c slightly tilted with call bell and safety belt.   Therapy Documentation Precautions:  Precautions Precautions:  Fall Precaution Comments: L inattention, L hemiparesis, L lean, hx of seizures Restrictions Weight Bearing Restrictions: No  Pain: No c/o pain in session    Therapy/Group: Individual Therapy  Willeen Cass [facility] 12/07/2021, 9:19 AM

## 2021-12-08 NOTE — Progress Notes (Signed)
PROGRESS NOTE   Subjective/Complaints:   Pt ignoring LUE , drooling Left side of mouth   Review of Systems  Constitutional:  Negative for chills and fever.  HENT:  Negative for congestion.   Eyes:  Negative for double vision.  Respiratory:  Negative for shortness of breath.   Cardiovascular:  Negative for chest pain.  Gastrointestinal:  Negative for abdominal pain, constipation, diarrhea, nausea and vomiting.  Neurological:  Positive for weakness. Negative for headaches.    Objective:   No results found. Recent Labs    12/06/21 0541  WBC 11.4*  HGB 14.9  HCT 42.1  PLT 532*    Recent Labs    12/06/21 0541  NA 134*  K 4.0  CL 98  CO2 23  GLUCOSE 85  BUN 14  CREATININE 1.22  CALCIUM 10.1     Intake/Output Summary (Last 24 hours) at 12/08/2021 0918 Last data filed at 12/08/2021 0836 Gross per 24 hour  Intake 150 ml  Output --  Net 150 ml         Physical Exam: Vital Signs Blood pressure 101/81, pulse (!) 54, temperature 97.8 F (36.6 C), resp. rate 15, height 6' (1.829 m), weight 95.7 kg, SpO2 97 %.   General: No acute distress Mood and affect are appropriate Heart: Regular rate and rhythm no rubs murmurs or extra sounds Lungs: Clear to auscultation, breathing unlabored, no rales or wheezes Abdomen: Positive bowel sounds, soft nontender to palpation, nondistended Extremities: No clubbing, cyanosis, or edema Skin: No evidence of breakdown, no evidence of rash   Neurologic exam:  Cognition: AAO to person, place, partially time (+ month, - day/date); Language: Mild dysarthria.  Insight: Poor insight into current condition.  Sensation: To light touch intact in BL UEs and LEs  CN: + L facial droop, + tongue deviation, + L shoulder shrug deficit Strength 5/5 RUE and RLE, 4 to 4+/5 LLE and 2- Elbow flexors o/w 0/ 5 LUE Sitting balance is fair  Assessment/Plan: 1. Functional deficits which  require 3+ hours per day of interdisciplinary therapy in a comprehensive inpatient rehab setting. Physiatrist is providing close team supervision and 24 hour management of active medical problems listed below. Physiatrist and rehab team continue to assess barriers to discharge/monitor patient progress toward functional and medical goals  Care Tool:  Bathing    Body parts bathed by patient: Face   Body parts bathed by helper: Right arm, Left arm, Chest, Abdomen, Buttocks, Right upper leg, Left upper leg, Right lower leg, Left lower leg, Front perineal area     Bathing assist Assist Level: Total Assistance - Patient < 25%     Upper Body Dressing/Undressing Upper body dressing   What is the patient wearing?: Pull over shirt    Upper body assist Assist Level: Maximal Assistance - Patient 25 - 49%    Lower Body Dressing/Undressing Lower body dressing      What is the patient wearing?: Pants, Incontinence brief     Lower body assist Assist for lower body dressing: Dependent - Patient 0%     Toileting Toileting    Toileting assist Assist for toileting: Maximal Assistance - Patient 25 - 49%  Transfers Chair/bed transfer  Transfers assist     Chair/bed transfer assist level: Minimal Assistance - Patient > 75%     Locomotion Ambulation   Ambulation assist      Assist level: 2 helpers Assistive device: No Device Max distance: 41f   Walk 10 feet activity   Assist     Assist level: 2 helpers Assistive device: No Device   Walk 50 feet activity   Assist Walk 50 feet with 2 turns activity did not occur: Safety/medical concerns         Walk 150 feet activity   Assist Walk 150 feet activity did not occur: Safety/medical concerns         Walk 10 feet on uneven surface  activity   Assist Walk 10 feet on uneven surfaces activity did not occur: Safety/medical concerns         Wheelchair     Assist Is the patient using a wheelchair?:  Yes (Pt transported to and from main therapy gym via TIS WC for time management.) Type of Wheelchair: Manual    Wheelchair assist level: Dependent - Patient 0% Max wheelchair distance: 150    Wheelchair 50 feet with 2 turns activity    Assist        Assist Level: Dependent - Patient 0%   Wheelchair 150 feet activity     Assist      Assist Level: Dependent - Patient 0%   Blood pressure 101/81, pulse (!) 54, temperature 97.8 F (36.6 C), resp. rate 15, height 6' (1.829 m), weight 95.7 kg, SpO2 97 %.  Medical Problem List and Plan: 1. 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 Formulized individual treatment plan based on medical history, underlying problem and comorbidities.              - Monitor for development of LUE tone, may need WHO  -Continue CIR   2.  Antithrombotics: -DVT/anticoagulation:  Pharmaceutical: Lovenox             -antiplatelet therapy: DAPT x 3 weeks (Plavix thru 10/25) followed by ASA alone.  3. Pain Management: Tylenol prn.  4. Mood/Behavior/Sleep: LCSW to follow for evaluation and support.              -antipsychotic agents: N/A 5. Neuropsych/cognition: This patient is not capable of making decisions on his own behalf. 6. Skin/Wound Care: routine pressure relief measures.              --continue supplements TID 7. Fluids/Electrolytes/Nutrition: Monitor I/O. Check lytes in am.             --will add Kdur X 2 days to prevent recurrent hypokalemia      Latest Ref Rng & Units 12/06/2021    5:41 AM 12/03/2021    4:30 AM 11/30/2021    4:30 AM  BMP  Glucose 70 - 99 mg/dL 85  95  95   BUN 6 - 20 mg/dL _0 Creatinine 0.61 -  1.24 mg/dL 1.22  1.03  1.06   Sodium 135 - 145 mmol/L 134  133  136  Potassium 3.5 - 5.1 mmol/L 4.0  4.1  3.5   Chloride 98 - 111 mmol/L 98  98  105   CO2 22 - 32 mmol/L _0 Calcium 8.9 - 10.3 mg/dL 10.1  9.9  9.3     8. Seizures: Continue Keppra 1500 mg BID             -- GTC at home on presentation, cEEG 9/22-23 showed no epileptiform discharges.  9. ABLA: Resolved 10. HTN: Monitor BP TID--controlled off meds Vitals:   12/07/21 1958 12/08/21 0628  BP: 104/80 101/81  Pulse: 70 (!) 54  Resp: 15 15  Temp: 98.4 F (36.9 C) 97.8 F (36.6 C)  SpO2: 96% 97%   Hx HTN but BPs ok, other CVA risk factors hx of smoking , ETOH  11. Hyperlipidemia: LDL 162--on Crestor 40 mg/day 12. Leucocytosis: Downtrending, 15. Monitor trend. Fevers resolving.  -Current workup: CXR neg 9/30, UA negative, Lower extremity doppler negative for DVT  -WBC stable at 15.7 10/6  -Recheck Monday, no signs or symptoms of infection 13. Intermittent Incontinence: Monitor initial at rehab.         -- LBM 9/30 per chart  -10/8 Order sorbitol 4m   [place]: [place] 12/08/2021, 9:18 AM

## 2021-12-08 NOTE — Progress Notes (Signed)
Patient ID: Frank Warner, male   DOB: 1980/12/14, 41 y.o.   MRN: [id number]  Team Conference Report to Patient/Family  Team Conference discussion was reviewed with the patient and caregiver, including goals, any changes in plan of care and target discharge date.  Patient and caregiver express understanding and are in agreement.  The patient has a target discharge date of 12/24/21.  Sw met with patient and spoke with mother via telephone and provided team conference updates.  Mother unable to come to the hospital to sit currently but will arrange time with daughter so that she is able to come. No additional questions or concerns.  Dyanne Iha 12/08/2021, 1:14 PM

## 2021-12-08 NOTE — Patient Care Conference (Signed)
Inpatient RehabilitationTeam Conference and Plan of Care Update Date: 12/08/2021   Time: 10:37 AM    Patient Name: Frank Warner      Medical Record Number: [id number]  Date of Birth: Apr 02, 1980 Sex: Male         Room/Bed: 4W01C/4W01C-01 Payor Info: Payor: MEDICAID POTENTIAL / Plan: MEDICAID POTENTIAL / Product Type: *No Product type* /    Admit Date/Time:  12/02/2021  2:02 PM  Primary Diagnosis:  Acute ischemic left middle cerebral artery (MCA) stroke [facility])  Hospital Problems: Principal Problem:   Acute ischemic left middle cerebral artery (MCA) stroke [facility])    Expected Discharge Date: Expected Discharge Date: 12/24/21  Team Members Present: Physician leading conference: Dr. Alysia Penna Social Worker Present: Erlene Quan, BSW Nurse Present: Dorien Chihuahua, RN PT Present: Alden Hipp, PT OT Present: Precious Haws, COTA;Jennifer [place], OT SLP Present: Sherren Kerns, SLP PPS Coordinator present : Gunnar Fusi, SLP     Current Status/Progress Goal Weekly Team Focus  Bowel/Bladder     Continent        Swallow/Nutrition/ Hydration   dys 2 diet with thin liquids min A  sup A  tolerance of current diet with implementation of safe swallowing precautions - pt known to be impulsive   ADL's   MINA for sit>stands, MINA for bathing from shower level, MIN A stand pivot transfer with HHA, MOD A for UB dressing, MOD A for LB dressing, MAX A for 3/3 toileting tasks. pt continues to present with LUE hemiparesis, impaired balance, and impaired awareness to CVA deficits. needs reeducation daily on why his LUE doesn't work and the purpose of rehab. pt easily frustrated/annoyed during sessions but has been able to redirect  supervision - MIN A goals  BADL reeducation, functional mobility, dynamic balance, LUE NMR   Mobility   Achilles pain and lack of tendon integrity noted by PT on 10/7, pt stating he ruptured his achilles in June 2023? Was recommended surgery, but did not follow  up, ortho assessment?? supine <> sit min A, stand pivot and squat pivot transfer min A, mod A + 2 for gait (decreased strength and coordination of L LE, poor upright posture/ L lean)  SPV at an ambulatory level with LRAD  Ortho assessment, gait training, transfer training, bed mobility, overall balance, stair training.   Communication   min A  mod I  pt does not wish to address speech goals at this time. Stated "I don't care about that" and did not find importance following discussion   Safety/Cognition/ Behavioral Observations  max A  sup-to-min A  sustained attention, memory, problem solving, awareness of deficits   Pain     N/a        Skin     N/a          Discharge Planning:  Patient uninsured. Discharging home with mother, S.O and other family to assist 24/7   Team Discussion: Patient post left MCA CVA with balance issues, hemiparesis,  perseveration and hard to redirect; needs constant re-education and redirection.  Patient on target to meet rehab goals: yes, currently needs CAGA for transfers, mod assist for ambulation with left lean and poor proprioception.  Goals for discharge set for supervision overall.  *See Care Plan and progress notes for long and short-term goals.   Revisions to Treatment Plan:  Wrist splint Lite gait training   Teaching Needs: Safety, cues for impulsivity and awareness, medications, dietary modification, transfers, toileting, etc.  Current Barriers to Discharge: Decreased caregiver  support  Possible Resolutions to Barriers: Family education     Medical Summary Current Status: Severe left neglect left hemisensory deficits, poor awareness of deficits, perseverates  Barriers to Discharge: Medical stability   Possible Resolutions to Barriers/Weekly Focus: will ask family to come in to help motivation , ask neuropsych   Continued Need for Acute Rehabilitation Level of Care: The patient requires daily medical management by a physician with  specialized training in physical medicine and rehabilitation for the following reasons: Direction of a multidisciplinary physical rehabilitation program to maximize functional independence : Yes Medical management of patient stability for increased activity during participation in an intensive rehabilitation regime.: Yes Analysis of laboratory values and/or radiology reports with any subsequent need for medication adjustment and/or medical intervention. : Yes   I attest that I was present, lead the team conference, and concur with the assessment and plan of the team.   Dorien Chihuahua B 12/08/2021, 3:30 PM

## 2021-12-08 NOTE — Progress Notes (Signed)
Orthopedic Tech Progress Note Patient Details:  Frank Warner 1981/02/09 [id number]  Called in order to [place] a WRIST SPLINT   Patient ID: Frank Warner, male   DOB: 05/19/1980, 41 y.o.   MRN: [id number]  [name] 12/08/2021, 12:18 PM

## 2021-12-08 NOTE — Progress Notes (Signed)
Speech Language Pathology Daily Session Note  Patient Details  Name: Frank Warner MRN: [id number] Date of Birth: Mar 13, 1980  Today's Date: 12/08/2021 SLP Individual Time: 1415-1500 SLP Individual Time Calculation (min): 45 min  Short Term Goals: Week 1: SLP Short Term Goal 1 (Week 1): Patient will consume current diet with minimal overt s/s of aspiration with mod A verbal cues for use of swallowing compensatory strategies. SLP Short Term Goal 2 (Week 1): Patient will utilize speech intelligibility strategies at the sentence level with overall min A verbal cues to achieve 90% intelligibility. SLP Short Term Goal 3 (Week 1): Patient will demonstrate sustained attention to a functional task for 10 minutes with Mod verbal cues for redirection. SLP Short Term Goal 4 (Week 1): Patient will demonstrate functional problem solving for basic and familiar tasks with Mod A verbal cues. SLP Short Term Goal 5 (Week 1): Patient will identify 2-3 areas of impairment and how they may impact ability to perform ADLs/iADLs to increase awareness of deficits with modA verbal cues SLP Short Term Goal 6 (Week 1): Patient will recall new, daily information with Mod A verbal and visual cues.  Skilled Therapeutic Interventions: Skilled ST treatment focused on swallowing and cognitive goals. Upon entry, pt requesting to consume burger friend brought for him. SLP with plans to assess dysphagia 3 trials and thus utilized burger (chopped into bite sized pieces & without the bread) as trial. Pt consumed with mildly prolonged mastication, mild oral residuals, minimal L pocketing, and no overt s/sx of aspiration. Bolus naturally transitioned toward left (weak) side, however pt exhibited awareness of this and was seen implementing lingual sweep and effectively clear with overall sup A verbal cues. Recommend diet advancement to dysphagia 3 textures and thin liquids with continuation of full supervision with PO intake. Allow drinks at  bedside if pt is fully upright. Pt often joking around with SLP and reported how he's going to "tear up" his food, referring to consuming rather large bites due to hunger. SLP reinforced to both pt and friend importance of taking upmost precaution with PO intake, especially with diet advancement to include small bites and sips, slow rate of consumption, and frequently assessing for pocketing. Both pt and friend verbalized understanding through teach back. It should be noted that pt did not demonstrate impulsive behavior with intake during today's trial, rather just spoke of this in a joking manner. Will continue to monitor tolerance and appropriateness/safety with PO consumption. SLP assisted with ordering pt's evening meal with food & nutrition services. Pt was not agreeable to executing call with SLP's support, as "I don't want to talk to no one." SLP facilitated sequence for calling in meal (1., dial number, 2. Provide room number, 3. Provide name, 4. Order meal). Pt was able to recall room number with sup A verbal cues for use of external aid in room.Patient was left in bed with alarm activated and immediate needs within reach at end of session. Continue per current plan of care.      Pain  None/denied  Therapy/Group: Individual Therapy  Patty Sermons 12/08/2021, 4:04 PM

## 2021-12-08 NOTE — Progress Notes (Signed)
Occupational Therapy Session Note  Patient Details  Name: Frank Warner MRN: [id number] Date of Birth: 08-20-1980  Today's Date: 12/08/2021 OT Individual Time: 1103-1202 session 1 OT Individual Time Calculation (min): 59 min  Session 2: 2409-7353   Short Term Goals: Week 1:  OT Short Term Goal 1 (Week 1): Pt will increase UB dressing while seated on EOB or at sink with Mod A utilizing hemi dressing techniques. OT Short Term Goal 2 (Week 1): Pt will increase LB dressing while seated on EOB or at sink with Mod A utilizing hemi dressing techniques. OT Short Term Goal 3 (Week 1): Pt will increase dynamic sitting balance to SBA while completing self care tasks such as LB dressing and bathing.  Skilled Therapeutic Interventions/Progress Updates:  Session 1: Pt greeted seated in w/c, pt  agreeable to OT intervention. Pt reports need to void bladder, total A transport into bathroom with pt completing stand pivot with MIN HHA with pt preferring to stand to use urinal, pt needed MOD A for 3/3 toileting with pt able to hold urinal with RUE.  + urine void, total A transport out of bathroom to complete hand hygiene at sink with cues needed to incorporate LUE into hand hygiene.  Total A transport into gym in w/c, utilized saebo arm mobilizer with pt able to work on scapular protraction/retraction from gravity eliminated plane as well as elbow flexion/extension. Pt initially seemed to be uninterested   but provided pt with visual targets with pt then more engaged in activity, also provided mirror for visual aid to decrease use of accessory muscles.  Pt completed stand pivot to EOM with MIN HHA. Worked on LUE from supine with pt note to have active elbow movement with L elbow supported and pt instructed to pull elbow towards nose, pt also noted to be able to control elbow descent when transitioning LUE from elbow flexion/extension. Pt seemed to be encouraged by these successes today.  Pt left up in w/c with alarm  belt activated and visitor present.          Session 2: Pt greeted supine in bed, pt agreeable to OT intervention. Pt lunch tray had just arrived, therefore assisted pt with supine>sit with CGA to R side. Once EOB and meal tray set- up, pt then reports " I'm not really that hungry, I've got all day to eat."   Pt preferred to work on LUE.  1:1 NMES applied to L wrist extensors at the below settings:    Ratio 1:3 Rate 35 pps Waveform- Asymmetric Ramp 1.0 Pulse 300 Intensity- 29  Duration -   50mins  Pt able to achieve full wrist extension with digit extension, no pain of adverse reactions noted after session. Pt left supine in bed with bed alarm activated and all need within reach.                      Therapy Documentation Precautions:  Precautions Precautions: Fall Precaution Comments: L inattention, L hemiparesis, L lean, hx of seizures Restrictions Weight Bearing Restrictions: No  Pain:no pain during either session     Therapy/Group: Individual Therapy  Corinne Ports [facility] 12/08/2021, 12:15 PM

## 2021-12-08 NOTE — Progress Notes (Addendum)
Pt's friend brought in food at lunch and dinner time. Pt has already consumed lunch without staff awareness. Educated pt and friend of the diet recommendations. Friend stated that she has chopped the food before giving it to pt. Pt was adamant that he will eat "real food." Continue to reinforce education.   Addendum  Pt's friend reported that pt "choked" on food that she brought him for dinner. Pt is stable and at baseline at this time.   Gerald Stabs, RN

## 2021-12-08 NOTE — Progress Notes (Signed)
Physical Therapy Session Note  Patient Details  Name: Frank Warner MRN: [id number] Date of Birth: March 11, 1980  Today's Date: 12/08/2021 PT Individual Time: 0903-1002 PT Individual Time Calculation (min): 59 min   Short Term Goals: Week 1:  PT Short Term Goal 1 (Week 1): Pt will perform bed mobility with CGA. PT Short Term Goal 2 (Week 1): Pt will perform sit<> stand transfer with CGA consistently and LRAD. PT Short Term Goal 3 (Week 1): Pt will perform bed<>chair transfer with CGA consistently and LRAD. PT Short Term Goal 4 (Week 1): Pt will amb 90 ft with mod assist +1 consistenly and LRAD. PT Short Term Goal 5 (Week 1): Pt will attempt stairs.  Skilled Therapeutic Interventions/Progress Updates:  Patient supine in bed on entrance to room. Patient alert and not initially 100% agreeable to PT session relating that it is early for therapy. Pt is very slow to initiate throughout session and requires max encouragement to participate.  Patient with no pain complaint at start of session.  RN enters room for med pass and pt encouraged to sit on EOB in order to promote improved positioning for swallow.   Therapeutic Activity: Bed Mobility: Pt performed supine --> sit with CGA to initiate and MinA to push up to seated position on EOB. VC/ tc required for technique throughout. Once seated EOB, pt noted to have wet t-shirt and pants with bed lines wet as well. Initiated return to supine for change of clothes. Guided in proper technique for roll to R and pt able to roll to L with supervision. Provided pt with washcloth to wash self and new brief donned with maxA. And pants with ModA. Return to sitting upright on EOB with MinA. Transfers: Pt performed sit<>stand transfer at EOB with Min/ ModA. Stand pivot with increased cues for pivot stepping and min/ ModA +1.  Neuromuscular Re-ed: NMR facilitated during session with focus on sitting balance and motor control. Pt guided in unsupported sitting at w/c  while performing self care at sink. Pt brushes teeth after putting toothpaste on toothbrush with supervision. Pt perseverates on brushing teeth and despite stating he was about to brush his tongue, begins to brush teeth again. Brushes 3x. Guided with instructional questioning re: what he is doing and plans for next task. All performed with no support to back. Reaching throughout for items at sink and tray table. NMR performed for improvements in motor control and coordination, balance, sequencing, judgement, and self confidence/ efficacy in performing all aspects of mobility at highest level of independence.   Patient seated upright in TIS w/c at end of session with brakes locked, slight tilt, belt alarm set, and all needs within reach.   Therapy Documentation Precautions:  Precautions Precautions: Fall Precaution Comments: L inattention, L hemiparesis, L lean, hx of seizures Restrictions Weight Bearing Restrictions: No General:   Vital Signs:  Pain: Pain Assessment Pain Scale: Faces Faces Pain Scale: No hurt  Therapy/Group: Individual Therapy  [name] PT, DPT, CSRS 12/08/2021, 12:41 PM

## 2021-12-09 NOTE — Progress Notes (Signed)
Physical Therapy Session Note  Patient Details  Name: Frank Warner MRN: [id number] Date of Birth: March 24, 1980  Today's Date: 12/09/2021 PT Individual Time: 0017-4944 PT Individual Time Calculation (min): 72 min   Short Term Goals: Week 1:  PT Short Term Goal 1 (Week 1): Pt will perform bed mobility with CGA. PT Short Term Goal 2 (Week 1): Pt will perform sit<> stand transfer with CGA consistently and LRAD. PT Short Term Goal 3 (Week 1): Pt will perform bed<>chair transfer with CGA consistently and LRAD. PT Short Term Goal 4 (Week 1): Pt will amb 90 ft with mod assist +1 consistenly and LRAD. PT Short Term Goal 5 (Week 1): Pt will attempt stairs.  Skilled Therapeutic Interventions/Progress Updates:      Pt resting in bed to start - awakens to voice and is agreeable to PT tx but require ++ time for waking up and initiating mobilization. With time and cues, pt able to initiate supine<>sitting with minA for L hemibody - pt lacks awareness of L arm during mobility as it's awkwardly placed behind him. He was able to don his R tennis shoe with setupA and maxA for his L tennis shoes.   Stand>pivot transfer with minA and no AD to w/c. Transported to main rehab gym for time.   Focused remainder of session on gait training and initiating stair training.  Gait training using EVA walker >238fwith minA with hand-over-hand assist for maintaining LUE grip to EVA walker. Inconsistent step lengths on L with intermittent toe drag on L. Provided shoe cover with his L foot and ace wrapped L hand to limit distractions. Ambulated >4077fwith minA and EVA walker with improved swing cycle on L - cues for upright and limiting reliance of elbow support through EVA walker. Able to progress gait training by indroducing large based quad cane - demonstrated cane sequencing to improve understanding and carryover. Gait training with QC ~20068f ~200f62fth minA - poor cane sequencing, inconsistent step length on L, narrow  BOS, overcrowding of feet. Dynamic gait training with ball toss to rehab tech with no AD and minA from PT for lateral trunk support. Pt unable to dual task ball toss and walking - would stop walking to toss ball despite instruction.   Stair training using 6inch steps with 1 hand rail on R. Forward facing - pt switching b/w alternating vs step-to pattern for both ascent and descent - no knee buckling observed on weaker L side but he did keep L knee flexed. Pt wanting to continue practicing stairs - completed x24 total without rest break. minA overall for balance and steadying.   Returned to room and pt assisted to bed. Alarm on, all needs met. Pt continues to have decreased insight into deficits and decreased safety awareness. He frequently would ask for PT to be hands off as it's considered "cheating" when PT provides assistance. He also believes he wont fall if he walks alone. Education provided throughout session on falls risk.    Therapy Documentation Precautions:  Precautions Precautions: Fall Precaution Comments: L inattention, L hemiparesis, L lean, hx of seizures Restrictions Weight Bearing Restrictions: No General:      Therapy/Group: Individual Therapy  Frank Simons12/2023, 7:42 AM

## 2021-12-09 NOTE — Progress Notes (Signed)
Patient ID: Frank Warner, male   DOB: 1980/05/23, 41 y.o.   MRN: [id number]  Family education Saturday 1-4

## 2021-12-09 NOTE — Progress Notes (Signed)
PROGRESS NOTE   Subjective/Complaints:   Discussed stroke deficits including swallowing dysfunction, Left hemiparesis and sensory deficits, left neglect , and poor awareness of deficit Discussed need of any outside food to be screened by staff to ensure compliance with diet consistency needs   Review of Systems  Constitutional:  Negative for chills and fever.  HENT:  Negative for congestion.   Eyes:  Negative for double vision.  Respiratory:  Negative for shortness of breath.   Cardiovascular:  Negative for chest pain.  Gastrointestinal:  Negative for abdominal pain, constipation, diarrhea, nausea and vomiting.  Neurological:  Positive for weakness. Negative for headaches.    Objective:   No results found. No results for input(s): "WBC", "HGB", "HCT", "PLT" in the last 72 hours.  No results for input(s): "NA", "K", "CL", "CO2", "GLUCOSE", "BUN", "CREATININE", "CALCIUM" in the last 72 hours.   Intake/Output Summary (Last 24 hours) at 12/09/2021 0811 Last data filed at 12/08/2021 1846 Gross per 24 hour  Intake 118 ml  Output 250 ml  Net -132 ml         Physical Exam: Vital Signs Blood pressure 109/73, pulse 62, temperature 98.5 F (36.9 C), temperature source Oral, resp. rate 18, height 6' (1.829 m), weight 95.7 kg, SpO2 97 %.   General: No acute distress Mood and affect are appropriate Heart: Regular rate and rhythm no rubs murmurs or extra sounds Lungs: Clear to auscultation, breathing unlabored, no rales or wheezes Abdomen: Positive bowel sounds, soft nontender to palpation, nondistended Extremities: No clubbing, cyanosis, or edema Skin: No evidence of breakdown, no evidence of rash  Neurologic exam:  Cognition: AAO to person, place, partially time (+ month, - day/date); Language: Mild dysarthria.  Insight: Poor insight into current condition.  Sensation: To light touch intact in BL UEs and LEs  CN: + L  facial droop, + tongue deviation, + L shoulder shrug deficit Strength 5/5 RUE and RLE, 4 to 4+/5 LLE and 2- Elbow flexors o/w 0/ 5 LUE Sitting balance is fair  Assessment/Plan: 1. Functional deficits which require 3+ hours per day of interdisciplinary therapy in a comprehensive inpatient rehab setting. Physiatrist is providing close team supervision and 24 hour management of active medical problems listed below. Physiatrist and rehab team continue to assess barriers to discharge/monitor patient progress toward functional and medical goals  Care Tool:  Bathing    Body parts bathed by patient: Face   Body parts bathed by helper: Right arm, Left arm, Chest, Abdomen, Buttocks, Right upper leg, Left upper leg, Right lower leg, Left lower leg, Front perineal area     Bathing assist Assist Level: Total Assistance - Patient < 25%     Upper Body Dressing/Undressing Upper body dressing   What is the patient wearing?: Pull over shirt    Upper body assist Assist Level: Maximal Assistance - Patient 25 - 49%    Lower Body Dressing/Undressing Lower body dressing      What is the patient wearing?: Pants, Incontinence brief     Lower body assist Assist for lower body dressing: Dependent - Patient 0%     Toileting Toileting    Toileting assist Assist for toileting: Maximal Assistance -  Patient 25 - 49%     Transfers Chair/bed transfer  Transfers assist     Chair/bed transfer assist level: Minimal Assistance - Patient > 75%     Locomotion Ambulation   Ambulation assist      Assist level: 2 helpers Assistive device: No Device Max distance: 60ft   Walk 10 feet activity   Assist     Assist level: 2 helpers Assistive device: No Device   Walk 50 feet activity   Assist Walk 50 feet with 2 turns activity did not occur: Safety/medical concerns         Walk 150 feet activity   Assist Walk 150 feet activity did not occur: Safety/medical concerns          Walk 10 feet on uneven surface  activity   Assist Walk 10 feet on uneven surfaces activity did not occur: Safety/medical concerns         Wheelchair     Assist Is the patient using a wheelchair?: Yes (Pt transported to and from main therapy gym via TIS WC for time management.) Type of Wheelchair: Manual    Wheelchair assist level: Dependent - Patient 0% Max wheelchair distance: 150    Wheelchair 50 feet with 2 turns activity    Assist        Assist Level: Dependent - Patient 0%   Wheelchair 150 feet activity     Assist      Assist Level: Dependent - Patient 0%   Blood pressure 109/73, pulse 62, temperature 98.5 F (36.9 C), temperature source Oral, resp. rate 18, height 6' (1.829 m), weight 95.7 kg, SpO2 97 %.  Medical Problem List and Plan: 1. 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  Antithrombotics: -DVT/anticoagulation:  Pharmaceutical: Lovenox             -antiplatelet therapy: DAPT x 3 weeks (Plavix thru 10/25) followed by ASA alone.  3. Pain Management: Tylenol prn.  4. Mood/Behavior/Sleep: LCSW to follow for evaluation and support.              -antipsychotic agents: N/A 5. Neuropsych/cognition: This patient is not capable of making decisions on his own behalf. 6. Skin/Wound Care: routine pressure relief measures.              --continue supplements TID 7. Fluids/Electrolytes/Nutrition: Monitor I/O. Check lytes in am.             --will add Kdur X 2 days to prevent recurrent hypokalemia      Latest Ref Rng & Units 12/06/2021    5:41 AM 12/03/2021    4:30 AM 11/30/2021    4:30 AM  BMP  Glucose 70 - 99 mg/dL 85  95  95   BUN 6 - 20 mg/dL 14  14  16    Creatinine 0.61 - 1.24  mg/dL 1.22  1.03  1.06   Sodium 135 - 145 mmol/L 134  133  136   Potassium 3.5 - 5.1 mmol/L 4.0  4.1  3.5   Chloride 98 - 111 mmol/L 98  98  105   CO2 22 - 32 mmol/L 23  24  22    Calcium 8.9 - 10.3 mg/dL 10.1  9.9  9.3     8. Seizures: Continue Keppra 1500 mg BID             -- GTC at home on presentation, cEEG 9/22-23 showed no epileptiform discharges.  9. ABLA: Resolved 10. HTN: Monitor BP TID--controlled off meds Vitals:   12/09/21 0650 12/09/21 0653  BP: 109/73   Pulse: (!) 53 62  Resp: 18   Temp: 98.5 F (36.9 C)   SpO2: 97%    Hx HTN but BPs ok, other CVA risk factors hx of smoking , ETOH  11. Hyperlipidemia: LDL 162--on Crestor 40 mg/day 12. Leucocytosis: Downtrending, 15. Monitor trend. Fevers resolving.  -Current workup: CXR neg 9/30, UA negative, Lower extremity doppler negative for DVT  -WBC stable at 15.7 10/6  -Recheck Monday, no signs or symptoms of infection 13. Intermittent Incontinence: Monitor initial at rehab.         -- LBM 9/30 per chart  -10/8 Order sorbitol 60mg    LOS: 7 days A FACE TO [place] E Lissette Schenk 12/09/2021, 8:11 AM

## 2021-12-09 NOTE — Progress Notes (Signed)
Occupational Therapy Session Note  Patient Details  Name: Frank Warner MRN: [id number] Date of Birth: Jul 10, 1980  Today's Date: 12/09/2021 OT Individual Time: 1052-1206 OT Individual Time Calculation (min): 74 min   Short Term Goals: Week 1:  OT Short Term Goal 1 (Week 1): Pt will increase UB dressing while seated on EOB or at sink with Mod A utilizing hemi dressing techniques. OT Short Term Goal 2 (Week 1): Pt will increase LB dressing while seated on EOB or at sink with Mod A utilizing hemi dressing techniques. OT Short Term Goal 3 (Week 1): Pt will increase dynamic sitting balance to SBA while completing self care tasks such as LB dressing and bathing.  Skilled Therapeutic Interventions/Progress Updates:  Pt greeted supine in bed, pt  agreeable to OT intervention. Session focus on BADL reeducation, functional mobility, dynamic standing balance and decreasing overall caregiver burden.   Pt reports spilling urinal in bed, requesting shower. Pt completed supine>sit to R side with CGA. Pt completed ambulatory transfer to walkin shower with MIN HHA, pt with LOB to L side x2. Pt completed bathing from shower seat with overall MIN A needing A to wash RUE and buttock in standing.  Pt continues to present with perseveration during tasks and needs gentle cues to sequence to next task however pt can become frustrated when pt feels like he is being rushed. Pt exited shower to [facility] for dressing with MIN HHA. Pt completed UB dressing with overall MOD A with no recall of hemi technique. Pt donned pants/brief with MOD A. Pt exited bathroom with MIN HHA. Pt completed oral care at sink from w/c, pt was able to recall hemitechnique in relation to oral care. Worked on donning deo with hand over hand assist for LUE. Pt asking for yogurt, retrieved yogurt with pt able to self feed with set- up assist with RUE, pt would benefit from dycem to assist with set- up of food items. No s/s of aspiration during self feeding.  pt  left seated in w/c with alarm belt activated and all needs within reach.                   Therapy Documentation Precautions:  Precautions Precautions: Fall Precaution Comments: L inattention, L hemiparesis, L lean, hx of seizures Restrictions Weight Bearing Restrictions: No Pain: no pain    Therapy/Group: Individual Therapy  Precious Haws 12/09/2021, 12:16 PM
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
## 2021-12-10 DIAGNOSIS — R569 Unspecified convulsions: Secondary | ICD-10-CM
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
## 2021-12-10 NOTE — Progress Notes (Addendum)
Speech Language Pathology Weekly Progress and Session Note  Patient Details  Name: Frank Warner MRN: [id number] Date of Birth: [birth year]  Beginning of progress report period: December 03, 2021 End of progress report period: December 09, 2021  Short Term Goals: Week 1: SLP Short Term Goal 1 (Week 1): Patient will consume current diet with minimal overt s/s of aspiration with mod A verbal cues for use of swallowing compensatory strategies. SLP Short Term Goal 1 - Progress (Week 1): Met SLP Short Term Goal 2 (Week 1): Patient will utilize speech intelligibility strategies at the sentence level with overall min A verbal cues to achieve 90% intelligibility. (pt with no interest nor participation with addressing goal) SLP Short Term Goal 2 - Progress (Week 1): Discontinued (comment) SLP Short Term Goal 3 (Week 1): Patient will demonstrate sustained attention to a functional task for 10 minutes with Mod verbal cues for redirection. SLP Short Term Goal 3 - Progress (Week 1): Revised due to lack of progress SLP Short Term Goal 4 (Week 1): Patient will demonstrate functional problem solving for basic and familiar tasks with Mod A verbal cues. SLP Short Term Goal 4 - Progress (Week 1): Met SLP Short Term Goal 5 (Week 1): Patient will identify 2-3 areas of impairment and how they may impact ability to perform ADLs/iADLs to increase awareness of deficits with modA verbal cues SLP Short Term Goal 5 - Progress (Week 1): Revised due to lack of progress SLP Short Term Goal 6 (Week 1): Patient will recall new, daily information with Mod A verbal and visual cues. SLP Short Term Goal 6 - Progress (Week 1): Met  New Short Term Goals: Week 2: SLP Short Term Goal 1 (Week 2): Patient will consume current diet with minimal overt s/s of aspiration with min A verbal cues for use of swallowing compensatory strategies. SLP Short Term Goal 2 (Week 2): Patient will demonstrate sustained attention to a functional task for 6  minutes with Mod verbal cues for redirection. SLP Short Term Goal 3 (Week 2): Patient will demonstrate functional problem solving for basic and familiar tasks with minto-mod A verbal cues. SLP Short Term Goal 4 (Week 2): Patient will identify 2-3 areas of impairment and how they may impact ability to perform ADLs/iADLs to increase awareness of deficits with mod-to-maxA verbal cues SLP Short Term Goal 5 (Week 2): Patient will recall new, daily information with min-to-mod A verbal and visual cues.  Weekly Progress Updates: Pt has demonstrated slow gains, as evident by meeting 3 out of 6 short-term goals this reporting period. Pt is limited by decreased intellectual awareness of deficits, limited sustained attention to tasks, poor frustration tolerance, and impulsivity. Pt is currently completing basic level cognitive-linguistic tasks with mod A verbal/visual cues in regards to functional problem solving and recall using external aids; max A for sustained attention in limited durations (<5 minutes) during functional/structured tasks although suspect this may also attribute to motivation/interest. Pt is currently consuming a dysphagia 3 diet with thin liquids and required full supervision with intake for implementation of swallowing compensatory strategies, as well as to monitor known impulsivity with PO consumption. SLP discontinued speech intelligibility goal d/t to pt's lack of interest/motivation to address goal. Pt and family education ongoing re: diet recommendations, supportive cognitive-communication interventions, and ST POC. Continue to recommend ST intervention during CIR admission, as well as ST f/u upon d/c. Recommend 24/7 supervision and assistance at time of discharge.     Intensity: Minumum of 1-2 x/day, 30 to 90  minutes Frequency: 3 to 5 out of 7 days Duration/Length of Stay: 10/27 Treatment/Interventions: Cognitive remediation/compensation;Environmental controls;Internal/external  aids;Speech/Language facilitation;Oral motor exercises;Cueing hierarchy;Therapeutic Exercise;Dysphagia/aspiration precaution training;Functional tasks;Patient/family education;Therapeutic Activities   Patty Sermons 12/10/2021, 1:44 PM

## 2021-12-10 NOTE — Progress Notes (Signed)
Physical Therapy Session Note  Patient Details  Name: Frank Warner MRN: [id number] Date of Birth: 29-Jan-1981  Today's Date: 12/10/2021 PT Individual Time: 6950-7225 PT Individual Time Calculation (min): 45 min   Short Term Goals: Week 1:  PT Short Term Goal 1 (Week 1): Pt will perform bed mobility with CGA. PT Short Term Goal 1 - Progress (Week 1): Met PT Short Term Goal 2 (Week 1): Pt will perform sit<> stand transfer with CGA consistently and LRAD. PT Short Term Goal 2 - Progress (Week 1): Met PT Short Term Goal 3 (Week 1): Pt will perform bed<>chair transfer with CGA consistently and LRAD. PT Short Term Goal 3 - Progress (Week 1): Met PT Short Term Goal 4 (Week 1): Pt will amb 90 ft with mod assist +1 consistenly and LRAD. PT Short Term Goal 4 - Progress (Week 1): Met PT Short Term Goal 5 (Week 1): Pt will attempt stairs. PT Short Term Goal 5 - Progress (Week 1): Met  Skilled Therapeutic Interventions/Progress Updates: Pt presents supine in bed and grudgingly agreeable to therapy.  Pt transfers sup to sit w/ siderail and supervision although requires constant attention for LUE.  Pt states and points to where it is but leaves it awkwardly behind.  Pt performed sit to stand w/ min A mostly for impulsive stand before PT ready.  Pt performed SPT w/ min A and then wheeled into BR.  Pt transfers sit to stand w/ min A at toilet and requires CGA for steadying and then pt manages clothing for continence of bladder in toilet, NT to chart.  Pt wheeled to main gym for time conservation.  Pt amb w/ EVA walker and mod A, mostly for management of walker in straight lines and for safe turns.  Pt w/ occasional scissoring when distracted.  Ace wrap to L hand.  Pt then amb w/ LBQC and mod A but tends to place can too far forward and then sequencing is lost as increased gait distance.  Pt amb x 100'  Pt amb through cone obstacle course w/ mod A for safety and verbal cues for going around and through cones.  Pt  negotiates cones w/o AD and then stepping over weighted bars w/ mod A and alternating feet.  Pt w/ 1 instance of dragging bar w/ L foot, but corrected w/ cueing, albeit high-stepping LLE.  Pt returned to room and handed off to [place].     Therapy Documentation Precautions:  Precautions Precautions: Fall Precaution Comments: L inattention, L hemiparesis, L lean, hx of seizures Restrictions Weight Bearing Restrictions: No General:   Vital Signs: Therapy Vitals Temp: 98 F (36.7 C) Temp Source: Oral Pulse Rate: (!) 57 Resp: 16 BP: 107/84 Patient Position (if appropriate): Lying Oxygen Therapy SpO2: 100 % O2 Device: Room Air Pain:0/10      Therapy/Group: Individual Therapy  Ladoris Gene 12/10/2021, 2:44 PM

## 2021-12-10 NOTE — Progress Notes (Signed)
Occupational Therapy Weekly Progress Note  Patient Details  Name: Frank Warner MRN: [id number] Date of Birth: August 10, 1980  Beginning of progress report period: December 03, 2021 End of progress report period: December 10, 2021  Today's Date: 12/10/2021 OT Individual Time:  -    Patient has met 3 of 3 short term goals. Pt making good progress this reporting period with pt needing MIN A for sit>stands with HHA, MIN A for stand pivot transfers with HHA, MIN A for bathing from shower level, MOD A for UB/LB dressing and MOD A for 3/3 toileting tasks. Pt continues to present with balance impairments with pt often leaning to L in standing, pt continues to present with decreased awareness into CVA deficits needing reeducation almost daily. 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 Plan for DC back home with pts mother with assist from girlfriend and brothers. DC date set for 10/27.   Patient continues to demonstrate the following deficits: muscle weakness, decreased cardiorespiratoy endurance, impaired timing and sequencing, abnormal tone, unbalanced muscle activation, motor apraxia, decreased coordination, and decreased motor planning, decreased visual perceptual skills, decreased attention to left and decreased motor planning, decreased attention, decreased awareness, decreased problem solving, and decreased safety awareness, and decreased standing balance, decreased postural control, hemiplegia, and decreased balance strategies and therefore will continue to benefit from skilled OT intervention to enhance overall performance with BADL and Reduce care partner burden.  Patient progressing toward long term goals..  Continue plan of care.  OT Short Term Goals Week 1:  OT Short Term Goal 1 (Week 1): Pt will increase UB dressing while seated on EOB or at sink with Mod A utilizing  hemi dressing techniques. OT Short Term Goal 1 - Progress (Week 1): Met OT Short Term Goal 2 (Week 1): Pt will increase LB dressing while seated on EOB or at sink with Mod A utilizing hemi dressing techniques. OT Short Term Goal 2 - Progress (Week 1): Met OT Short Term Goal 3 (Week 1): Pt will increase dynamic sitting balance to SBA while completing self care tasks such as LB dressing and bathing. OT Short Term Goal 3 - Progress (Week 1): Met Week 2:  OT Short Term Goal 1 (Week 2): pt will complete 1/3 parts of toileting with MINA OT Short Term Goal 2 (Week 2): pt will demonstrate emergent awareness during functional mobility tasks OT Short Term Goal 3 (Week 2): pt will complete sit>stands with CGA with LRAD    Therapy Documentation Precautions:  Precautions Precautions: Fall Precaution Comments: L inattention, L hemiparesis, L lean, hx of seizures Restrictions Weight Bearing Restrictions: No     Therapy/Group: Individual Therapy  Corinne Ports [facility] 12/10/2021, 8:29 AM

## 2021-12-10 NOTE — Plan of Care (Signed)
  Problem: RH Expression Communication Goal: LTG Patient will increase speech intelligibility (SLP) Description: LTG: Patient will increase speech intelligibility at word/phrase/conversation level with cues, % of the time (SLP) Outcome: Not Applicable Note: Pt with no interest nor participation with addressing speech goal

## 2021-12-10 NOTE — Progress Notes (Signed)
Speech Language Pathology Daily Session Note  Patient Details  Name: Kentrail Shew MRN: [id number] Date of Birth: May 23, 1980  Today's Date: 12/10/2021 SLP Individual Time: 1345-1429 SLP Individual Time Calculation (min): 44 min  Short Term Goals: Week 2: SLP Short Term Goal 1 (Week 2): Patient will consume current diet with minimal overt s/s of aspiration with min A verbal cues for use of swallowing compensatory strategies. SLP Short Term Goal 2 (Week 2): Patient will demonstrate sustained attention to a functional task for 6 minutes with Mod verbal cues for redirection. SLP Short Term Goal 3 (Week 2): Patient will demonstrate functional problem solving for basic and familiar tasks with minto-mod A verbal cues. SLP Short Term Goal 4 (Week 2): Patient will identify 2-3 areas of impairment and how they may impact ability to perform ADLs/iADLs to increase awareness of deficits with mod-to-maxA verbal cues SLP Short Term Goal 5 (Week 2): Patient will recall new, daily information with min-to-mod A verbal and visual cues.  Skilled Therapeutic Interventions:Skilled ST services focused on cognitive skills. SLP facilitated sustained attention, mildly complex-complex problem solving and error awareness in PEG design tasks, pt required only supervision A verbal cues for error awareness on the complex design and sustained attention in 30 minute intervals with min A verbal cues for redirection. Pt expressed he believed his cognition skills are near baseline, but the largest frustration he has ST services related is the reduce control of left anterior secretions. Pt demonstrated ability to wipe away spillage in 70% of opportunities and SLP provided education to utilize swallowing verse wiping with an intention recall every minute or so secretions will build up. Pt agreed. Pt was left in room with call bell within reach and chair alarm set. SLP recommends to continue skilled services.       Pain Pain  Assessment Pain Score: 0-No pain  Therapy/Group: Individual Therapy  Henry Demeritt  [facility] 12/10/2021, 3:25 PM

## 2021-12-10 NOTE — Progress Notes (Signed)
PROGRESS NOTE   Subjective/Complaints:  Having BMs without laxatives, refusing scheduled  Miralax   Review of Systems  Constitutional:  Negative for chills and fever.  HENT:  Negative for congestion.   Eyes:  Negative for double vision.  Respiratory:  Negative for shortness of breath.   Cardiovascular:  Negative for chest pain.  Gastrointestinal:  Negative for abdominal pain, constipation, diarrhea, nausea and vomiting.  Neurological:  Positive for weakness. Negative for headaches.    Objective:   No results found. No results for input(s): "WBC", "HGB", "HCT", "PLT" in the last 72 hours.  No results for input(s): "NA", "K", "CL", "CO2", "GLUCOSE", "BUN", "CREATININE", "CALCIUM" in the last 72 hours.   Intake/Output Summary (Last 24 hours) at 12/10/2021 0854 Last data filed at 12/09/2021 1830 Gross per 24 hour  Intake 240 ml  Output --  Net 240 ml         Physical Exam: Vital Signs Blood pressure 112/74, pulse (!) 55, temperature 97.6 F (36.4 C), temperature source Oral, resp. rate 18, height 6' (1.829 m), weight 95.7 kg, SpO2 99 %.   General: No acute distress Mood and affect are appropriate Heart: Regular rate and rhythm no rubs murmurs or extra sounds Lungs: Clear to auscultation, breathing unlabored, no rales or wheezes Abdomen: Positive bowel sounds, soft nontender to palpation, nondistended Extremities: No clubbing, cyanosis, or edema Skin: No evidence of breakdown, no evidence of rash  Sensation intact LT LUE and LLE  Strength 5/5 RUE and RLE, 4 to 4+/5 LLE and 2- Elbow flexors o/w 0/ 5 LUE Sitting balance is fair  Assessment/Plan: 1. Functional deficits which require 3+ hours per day of interdisciplinary therapy in a comprehensive inpatient rehab setting. Physiatrist is providing close team supervision and 24 hour management of active medical problems listed below. Physiatrist and rehab team  continue to assess barriers to discharge/monitor patient progress toward functional and medical goals  Care Tool:  Bathing    Body parts bathed by patient: Left arm, Chest, Abdomen, Front perineal area, Buttocks, Right upper leg, Left upper leg, Right lower leg, Left lower leg, Face   Body parts bathed by helper: Right arm     Bathing assist Assist Level: Minimal Assistance - Patient > 75%     Upper Body Dressing/Undressing Upper body dressing   What is the patient wearing?: Pull over shirt    Upper body assist Assist Level: Moderate Assistance - Patient 50 - 74%    Lower Body Dressing/Undressing Lower body dressing      What is the patient wearing?: Pants, Incontinence brief     Lower body assist Assist for lower body dressing: Moderate Assistance - Patient 50 - 74%     Toileting Toileting    Toileting assist Assist for toileting: Maximal Assistance - Patient 25 - 49%     Transfers Chair/bed transfer  Transfers assist     Chair/bed transfer assist level: Minimal Assistance - Patient > 75%     Locomotion Ambulation   Ambulation assist      Assist level: 2 helpers Assistive device: No Device Max distance: 30ft   Walk 10 feet activity   Assist     Assist  level: 2 helpers Assistive device: No Device   Walk 50 feet activity   Assist Walk 50 feet with 2 turns activity did not occur: Safety/medical concerns         Walk 150 feet activity   Assist Walk 150 feet activity did not occur: Safety/medical concerns         Walk 10 feet on uneven surface  activity   Assist Walk 10 feet on uneven surfaces activity did not occur: Safety/medical concerns         Wheelchair     Assist Is the patient using a wheelchair?: Yes (Pt transported to and from main therapy gym via TIS WC for time management.) Type of Wheelchair: Manual    Wheelchair assist level: Dependent - Patient 0% Max wheelchair distance: 150    Wheelchair 50 feet  with 2 turns activity    Assist        Assist Level: Dependent - Patient 0%   Wheelchair 150 feet activity     Assist      Assist Level: Dependent - Patient 0%   Blood pressure 112/74, pulse (!) 55, temperature 97.6 F (36.4 C), temperature source Oral, resp. rate 18, height 6' (1.829 m), weight 95.7 kg, SpO2 99 %.  Medical Problem List and Plan: 1. 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  Antithrombotics: -DVT/anticoagulation:  Pharmaceutical: Lovenox             -antiplatelet therapy: DAPT x 3 weeks (Plavix thru 10/25) followed by ASA alone.  3. Pain Management: Tylenol prn.  4. Mood/Behavior/Sleep: LCSW to follow for evaluation and support.              -antipsychotic agents: N/A 5. Neuropsych/cognition: This patient is not capable of making decisions on his own behalf. 6. Skin/Wound Care: routine pressure relief measures.              --continue supplements TID 7. Fluids/Electrolytes/Nutrition: Monitor I/O. Check lytes in am.             --will add Kdur X 2 days to prevent recurrent hypokalemia      Latest Ref Rng & Units 12/06/2021    5:41 AM 12/03/2021    4:30 AM 11/30/2021    4:30 AM  BMP  Glucose 70 - 99 mg/dL 85  95  95   BUN 6 - 20 mg/dL 14  14  16    Creatinine 0.61 - 1.24 mg/dL 1.22  1.03  1.06   Sodium 135 - 145 mmol/L 134  133  136   Potassium 3.5 - 5.1 mmol/L 4.0  4.1  3.5   Chloride 98 - 111 mmol/L 98  98  105   CO2 22 - 32 mmol/L 23  24  22    Calcium 8.9 - 10.3 mg/dL 10.1  9.9  9.3     8. Seizures: Continue Keppra 1500 mg BID             -- GTC at home on presentation, cEEG 9/22-23 showed no epileptiform discharges.  9. ABLA: Resolved 10. HTN: Monitor BP TID--controlled  off  meds Vitals:   12/09/21 1949 12/10/21 0538  BP: 113/77 112/74  Pulse: (!) 55 (!) 55  Resp: 18 18  Temp: 97.7 F (36.5 C) 97.6 F (36.4 C)  SpO2: 100% 99%   Hx HTN but BPs ok, other CVA risk factors hx of smoking , ETOH  11. Hyperlipidemia: LDL 162--on Crestor 40 mg/day 12. Leucocytosis: Downtrending, 15. Monitor trend. Fevers resolving.  -Current workup: CXR neg 9/30, UA negative, Lower extremity doppler negative for DVT  -WBC stable at 15.7 10/6  -Recheck Monday, no signs or symptoms of infection 13. Constipation refusing miralax most doses        -- LBM 10/12- change Miralax to prn  LOS: 8 days A FACE TO FACE EVALUATION WAS PERFORMED  Erick Colace 12/10/2021, 8:54 AM

## 2021-12-10 NOTE — Progress Notes (Signed)
Occupational Therapy Session Note  Patient Details  Name: Frank Warner MRN: [id number] Date of Birth: 1981-02-07  Today's Date: 12/10/2021 OT Individual Time: 1439-1540 OT Individual Time Calculation (min): 61 min    Short Term Goals: Week 2:  OT Short Term Goal 1 (Week 2): pt will complete 1/3 parts of toileting with MINA OT Short Term Goal 2 (Week 2): pt will demonstrate emergent awareness during functional mobility tasks OT Short Term Goal 3 (Week 2): pt will complete sit>stands with CGA with LRAD  Skilled Therapeutic Interventions/Progress Updates:  Pt greeted seated in w/c, pt agreeable to OT intervention. Pt transported to gym in w/c with total A with session focused on LUE NMR. Worked on table top ROM with LUE with pt able to demo trace activation in first digit and thumb, provided pt with compliant cube with pt able to squeeze cube lightly.  Also worked on elbow activation in gravity eliminated plane with pt able to demo 2-/5 in elbow as well as shoulder. Pt even able to demo trace activation in L wrist.   1:1 NMES applied to L digit flexors and L abductor pollicis at the below settings:    Ratio 1:3 Rate 35 pps Waveform- Asymmetric Ramp 1.0 Pulse 300 Intensity- 27  Duration -  10 mins  Pt able to achieve cylindrical grasp at this setting able to squeeze cube getting great return especially in first digit and thumb. No pain or adverse reactions noted during/after session.  Pt able to ambulate to EOB with MIN HHA, worked on [employer] to LUE with pt able to lateral lean to L forearm x10 with supervision and MIN verbal/visual cues for technique. Pt even able to push up from forearm onto palm x5, pt does report pain in shoulder after 5 reps therefore terminated task.   Pt transported back to room in w/c with total A with pt able to ambulate to EOB with MIN HHA. Pt left supine in bed with bed alarm activated and all needs within reach.                  Therapy  Documentation Precautions:  Precautions Precautions: Fall Precaution Comments: L inattention, L hemiparesis, L lean, hx of seizures Restrictions Weight Bearing Restrictions: No  Therapy/Group: Individual Therapy  Corinne Ports [facility] 12/10/2021, 4:08 PM

## 2021-12-10 NOTE — Progress Notes (Signed)
Physical Therapy Weekly Progress Note  Patient Details  Name: Frank Warner MRN: [id number] Date of Birth: 05-30-1980  Beginning of progress report period: December 03, 2021 End of progress report period: December 10, 2021  Today's Date: 12/10/2021 PT Individual Time: 1104-1204 PT Individual Time Calculation (min): 60 min   Patient has met 5 of 5 short term goals.   Frank Warner is progressing well in therapy showing improvements in his ability to perform bed mobility, transfers, gait, and stair training. He is currently at min assist/CGA with bed mobility, CGA with bed to chair transfers, CGA with sit to stand transfers, min/mod assist with gait, and min assist with stair negotiation. His gait impairments are L lateral leaning, increased gait speed that is unsafe, NBOS, increased step and stride length, difficulty with L swing through, and unsafe RW management. The pt would benefit from continued skilled CIR level therapy to address impairments. Planning to DC home with mother providing 24 hours support and follow up with OPPT.   Patient continues to demonstrate the following deficits muscle weakness, decreased cardiorespiratoy endurance, impaired timing and sequencing, unbalanced muscle activation, decreased coordination, and decreased motor planning,  , decreased midline orientation, decreased safety awareness and delayed processing, and decreased sitting balance, decreased standing balance, decreased postural control, hemiplegia, and decreased balance strategies and therefore will continue to benefit from skilled PT intervention to increase functional independence with mobility.  Patient progressing toward long term goals..  Continue plan of care.  PT Short Term Goals Week 1:  PT Short Term Goal 1 (Week 1): Pt will perform bed mobility with CGA. PT Short Term Goal 1 - Progress (Week 1): Met PT Short Term Goal 2 (Week 1): Pt will perform sit<> stand transfer with CGA consistently and LRAD. PT  Short Term Goal 2 - Progress (Week 1): Met PT Short Term Goal 3 (Week 1): Pt will perform bed<>chair transfer with CGA consistently and LRAD. PT Short Term Goal 3 - Progress (Week 1): Met PT Short Term Goal 4 (Week 1): Pt will amb 90 ft with mod assist +1 consistenly and LRAD. PT Short Term Goal 4 - Progress (Week 1): Met PT Short Term Goal 5 (Week 1): Pt will attempt stairs. PT Short Term Goal 5 - Progress (Week 1): Met Week 2:  PT Short Term Goal 1 (Week 2): Pt will perform bed mobility with SPV consistently. PT Short Term Goal 2 (Week 2): Pt will perform sit <> stand transfers with SBA and LRAD consistenly. PT Short Term Goal 3 (Week 2): Pt will perform bed <> chair transfers with SBA and LRAD consistenly. PT Short Term Goal 4 (Week 2): Pt will amb 166f with CGA and LRAD. PT Short Term Goal 5 (Week 2): Pt will perform stair negotiaton (3 steps) with CGA and LRAD.  Skilled Therapeutic Interventions/Progress Updates:  Ambulation/gait training;Cognitive remediation/compensation;Discharge planning;DME/adaptive equipment instruction;Functional mobility training;Pain management;Psychosocial support;Splinting/orthotics;Therapeutic Activities;UE/LE Strength taining/ROM;Visual/perceptual remediation/compensation;Wheelchair propulsion/positioning;UE/LE Coordination activities;Therapeutic Exercise;Stair training;Skin care/wound management;Patient/family education;Neuromuscular re-education;Functional electrical stimulation;Disease management/prevention;Community reintegration;Balance/vestibular training   Pt greeted supine in bed sleeping, but easily awakened and agreeable to therapy. Pt reports no pain and requires increased time and motivation to initiate mobilization.   Pt able to perform supine to sit with SPV with HOB elevated and use of bed railings.  Max assist for donning shoes for time management.   Stand pivot to the L from EOB to TIS WC with CGA. Cues given to put to not stand until SPT is  ready.   Transported pt to  main therapy gym via [facility] for time management.   Gait training with RW and L hand splint. First first educated on use of L hand splint and management of RW.   70f with mod assist for balance and steadiness. Pt demonstrating the following gait deviations and SPT providing the described cuing and facilitation for improvement:  - Increased L lateral leaning with verbal and tactile cues for correction.  - increased gait speed at an unsafe speed. Pt cued to slow down, but initially had trouble decreasing speed.  -scissoring gait pattern with NBOS. Cues to increase step width.   After 44fpt stated he really needed to use the restroom, but was incontinent of bladder within 10 seconds. Amb [id number] a chair and static and dynamic sitting and standing balance challenged during personal hygiene and clean up. Sitting required SBA and standing required CGA. Min-Mod assist needed for personal hygiene.   Once cleaned up pt able 200f50fth RW and min assist. He demonstrated improved gait speed, which improved overall balance. Continued L lateral leaning and NBOS as stated above.   Stand>sit from RW to EOB with CGA and cues to unstrap hand prior to sitting.  Sit> supine with SBA and pt able to bridge and scoot side to side and up with SBA.   Left pt supine in bed with call bell in reach, bed alarm on, and all needs met.    Therapy Documentation Precautions:  Precautions Precautions: Fall Precaution Comments: L inattention, L hemiparesis, L lean, hx of seizures Restrictions Weight Bearing Restrictions: No  Therapy/Group: Individual Therapy  AnneKayleen MemosT  12/10/2021, 9:41 AM

## 2021-12-11 NOTE — Progress Notes (Signed)
Occupational Therapy Session Note  Patient Details  Name: Frank Warner MRN: [id number] Date of Birth: [birth year]  Today's Date: 12/11/2021 OT Individual Time: 1345-1446 OT Individual Time Calculation (min): 61 min    Short Term Goals: Week 2:  OT Short Term Goal 1 (Week 2): pt will complete 1/3 parts of toileting with MINA OT Short Term Goal 2 (Week 2): pt will demonstrate emergent awareness during functional mobility tasks OT Short Term Goal 3 (Week 2): pt will complete sit>stands with CGA with LRAD  Skilled Therapeutic Interventions/Progress Updates:  Skilled OT intervention completed with focus on family education with pt's mother present. Pt received upright in bed, no c/o pain. Pt was initially perseverative on toileting incidents that occur when staff members "don't place urinal at bedside" for his access, with pt stating "I'm gonna get up if they don't give me what I need" Education provided about trying to void every couple of hours prior to urgent need to eliminate his attempts to get OOB unassisted or incontinent episodes. Pt then agreeable to use bathroom prior to out of room therapy.  Supervision bed mobility to EOB, sit > stand with CGA without AD, then stand pivot to w/c per pt preference for privacy with urinal use in bathroom. Stood with CGA, with pt attempting to stand over toilet to urinate but then fumbling with urinal in R hand, unable to problem solve need of putting urinal down for management of penis for voiding in commode. With cues, therapist positioned urinal with total A while pt managed his aim and pants management with R hand.   Transported <> ADL apartment for time management. Education provided on DME available such as tub bench or shower chair that can be purchased for use at home, suggested use of [facility] for ease of bathing, shower curtain management to prevent water spillage, minimizing stands via lateral leans for pericare, use of grab bars/installation as well as  effective safety strategies for exiting the shower to eliminate falls.  Per pt's mother- she has a 1/2 bath on main floor, then upstairs has 2 bathrooms, one that has a tub shower with shower head on the R (that is pt's preference via statement of "you can go ahead and delete that other bathroom from your note because I refuse to use it) and the other one has walk in shower and standard tub that is handicap accessible from prior use. The mother indicated that in the bathroom of pt's choice, the toilet is very close to the shower which would challenge a device like a TTB to fit as designed. Discussed using tub bench facing the opposite way to allow room for ambulatory transfer and lifting legs in and out.   Therapist and pt demonstrated sit > stand and stand pivot at the CGA level from w/c <> tub bench, then supervision for sit pivot <> tub. Pt was very adamant about not having HHA for balance, and for therapist to "back off" because he "doesn't ever need anyone to hold onto him." Poor insights to deficits were frequently demonstrated with re-education needed throughout session for safety awareness. Had pt's mother practice same assist as before at the [place] level, with mod-max cues needed for ensuring closeness to the pt with her body positioning in the event of a L lean or imbalance. Mother brought up that she prefers the use of a "double belt" that she can tie around her waist and him both, so she can use her back to pull him forward if she  needed. Attempted education on this not being the safest option for her or the pt, however indicated that she could bring in to future session for demonstration prior to it being encouraged at home as this therapist anticipates it is not ideal for his CLOF.   Back in room, pt completed CGA sit > stand and stand pivot to EOB, returned to bed with supervision, and remained upright in bed with mother present and all immediate needs met at end of session.    Therapy  Documentation Precautions:  Precautions Precautions: Fall Precaution Comments: L inattention, L hemiparesis, L lean, hx of seizures Restrictions Weight Bearing Restrictions: No    Therapy/Group: Individual Therapy  Blase Mess, MS, OTR/L  12/11/2021, 3:52 PM

## 2021-12-11 NOTE — Progress Notes (Signed)
PROGRESS NOTE   Subjective/Complaints: MEWS 1 for mental status He was sleeping this morning, alert later in the day Patient's chart reviewed- No issues reported overnight Vitals signs stable except for bradycardia to 56  Denies pain  Review of Systems  Constitutional:  Negative for chills and fever.  HENT:  Negative for congestion.   Eyes:  Negative for double vision.  Respiratory:  Negative for shortness of breath.   Cardiovascular:  Negative for chest pain.  Gastrointestinal:  Negative for abdominal pain, constipation, diarrhea, nausea and vomiting.  Neurological:  Positive for weakness. Negative for headaches.    Objective:   No results found. No results for input(s): "WBC", "HGB", "HCT", "PLT" in the last 72 hours.  No results for input(s): "NA", "K", "CL", "CO2", "GLUCOSE", "BUN", "CREATININE", "CALCIUM" in the last 72 hours.   Intake/Output Summary (Last 24 hours) at 12/11/2021 1350 Last data filed at 12/10/2021 2300 Gross per 24 hour  Intake 357 ml  Output 250 ml  Net 107 ml         Physical Exam: Vital Signs Blood pressure 114/73, pulse (!) 56, temperature 98 F (36.7 C), resp. rate 17, height 6' (1.829 m), weight 95.7 kg, SpO2 98 %.   General: No acute distress Mood and affect are appropriate Heart: Bradycardia Lungs: Clear to auscultation, breathing unlabored, no rales or wheezes Abdomen: Positive bowel sounds, soft nontender to palpation, nondistended Extremities: No clubbing, cyanosis, or edema Skin: No evidence of breakdown, no evidence of rash  Sensation intact LT LUE and LLE  Strength 5/5 RUE and RLE, 4 to 4+/5 LLE and 2- Elbow flexors o/w 0/ 5 LUE Sitting balance is fair  Assessment/Plan: 1. Functional deficits which require 3+ hours per day of interdisciplinary therapy in a comprehensive inpatient rehab setting. Physiatrist is providing close team supervision and 24 hour management of  active medical problems listed below. Physiatrist and rehab team continue to assess barriers to discharge/monitor patient progress toward functional and medical goals  Care Tool:  Bathing    Body parts bathed by patient: Left arm, Chest, Abdomen, Front perineal area, Buttocks, Right upper leg, Left upper leg, Right lower leg, Left lower leg, Face   Body parts bathed by helper: Right arm     Bathing assist Assist Level: Minimal Assistance - Patient > 75%     Upper Body Dressing/Undressing Upper body dressing   What is the patient wearing?: Pull over shirt    Upper body assist Assist Level: Moderate Assistance - Patient 50 - 74%    Lower Body Dressing/Undressing Lower body dressing      What is the patient wearing?: Pants, Incontinence brief     Lower body assist Assist for lower body dressing: Moderate Assistance - Patient 50 - 74%     Toileting Toileting    Toileting assist Assist for toileting: Minimal Assistance - Patient > 75%     Transfers Chair/bed transfer  Transfers assist     Chair/bed transfer assist level: Contact Guard/Touching assist     Locomotion Ambulation   Ambulation assist      Assist level: Moderate Assistance - Patient 50 - 74% Assistive device: Cane-quad Max distance: 100  Walk 10 feet activity   Assist     Assist level: Moderate Assistance - Patient - 50 - 74% Assistive device: Cane-quad   Walk 50 feet activity   Assist Walk 50 feet with 2 turns activity did not occur: Safety/medical concerns  Assist level: Moderate Assistance - Patient - 50 - 74% Assistive device: Cane-quad    Walk 150 feet activity   Assist Walk 150 feet activity did not occur: Safety/medical concerns  Assist level: Moderate Assistance - Patient - 50 - 74% Assistive device: Walker-rolling    Walk 10 feet on uneven surface  activity   Assist Walk 10 feet on uneven surfaces activity did not occur: Safety/medical concerns          Wheelchair     Assist Is the patient using a wheelchair?: Yes (Pt transported to and from main therapy gym via TIS WC for time management.) Type of Wheelchair: Manual    Wheelchair assist level: Dependent - Patient 0% Max wheelchair distance: 150    Wheelchair 50 feet with 2 turns activity    Assist        Assist Level: Dependent - Patient 0%   Wheelchair 150 feet activity     Assist      Assist Level: Dependent - Patient 0%   Blood pressure 114/73, pulse (!) 56, temperature 98 F (36.7 C), resp. rate 17, height 6' (1.829 m), weight 95.7 kg, SpO2 98 %.  Medical Problem List and Plan: 1. 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  Antithrombotics: -DVT/anticoagulation:  Pharmaceutical: Lovenox             -antiplatelet therapy: DAPT x 3 weeks (Plavix thru 10/25) followed by ASA alone.  3. Pain Management: Tylenol prn.  4. Mood/Behavior/Sleep: LCSW to follow for evaluation and support.              -antipsychotic agents: N/A 5. Neuropsych/cognition: This patient is not capable of making decisions on his own behalf. 6. Skin/Wound Care: routine pressure relief measures.              --continue supplements TID 7. Fluids/Electrolytes/Nutrition: Monitor I/O. Check lytes in am.             --will add Kdur X 2 days to prevent recurrent hypokalemia      Latest Ref Rng & Units 12/06/2021    5:41 AM 12/03/2021    4:30 AM 11/30/2021    4:30 AM  BMP  Glucose 70 - 99 mg/dL 85  95  95   BUN 6 - 20 mg/dL 14  14  16    Creatinine 0.61 - 1.24 mg/dL  1.91  4.78   Sodium 135 - 145 mmol/L 134  133  136   Potassium 3.5 - 5.1 mmol/L 4.0  4.1  3.5   Chloride 98 - 111 mmol/L 98  98  105   CO2 22 - 32 mmol/L 23  24   22    Calcium 8.9 - 10.3 mg/dL 2.95  9.9  9.3     8. Seizures: Continue Keppra 1500 mg BID             --  GTC at home on presentation, cEEG 9/22-23 showed no epileptiform discharges.  9. ABLA: Resolved 10. HTN: Monitor BP TID--controlled off meds Vitals:   12/11/21 0322 12/11/21 1303  BP: 125/70 114/73  Pulse: 65 (!) 56  Resp: 18 17  Temp: 98 F (36.7 C) 98 F (36.7 C)  SpO2: 100% 98%   Hx HTN but BPs ok, other CVA risk factors hx of smoking , ETOH  11. Hyperlipidemia: LDL 162--continue Crestor 40 mg/day 12. Leucocytosis: Downtrending, 15. Monitor trend. Fevers resolving.  -Current workup: CXR neg 9/30, UA negative, Lower extremity doppler negative for DVT  -WBC stable at 15.7 10/6  -Recheck Monday, no signs or symptoms of infection 13. Constipation refusing miralax most doses        -- LBM 10/12- change Miralax to prn 14. Bradycardia: continue to monitor HR TID  LOS: 9 days A FACE TO FACE EVALUATION WAS PERFORMED  Kit Brubacher P Andreus Cure 12/11/2021, 1:50 PM

## 2021-12-11 NOTE — Progress Notes (Signed)
Physical Therapy Session Note  Patient Details  Name: Frank Warner MRN: [id number] Date of Birth: [birth year]  Today's Date: 12/11/2021 PT Individual Time: 6712-4580 PT Individual Time Calculation (min): 54 min   Short Term Goals: Week 1:  PT Short Term Goal 1 (Week 1): Pt will perform bed mobility with CGA. PT Short Term Goal 1 - Progress (Week 1): Met PT Short Term Goal 2 (Week 1): Pt will perform sit<> stand transfer with CGA consistently and LRAD. PT Short Term Goal 2 - Progress (Week 1): Met PT Short Term Goal 3 (Week 1): Pt will perform bed<>chair transfer with CGA consistently and LRAD. PT Short Term Goal 3 - Progress (Week 1): Met PT Short Term Goal 4 (Week 1): Pt will amb 90 ft with mod assist +1 consistenly and LRAD. PT Short Term Goal 4 - Progress (Week 1): Met PT Short Term Goal 5 (Week 1): Pt will attempt stairs. PT Short Term Goal 5 - Progress (Week 1): Met  Skilled Therapeutic Interventions/Progress Updates:    Chart reviewed and pt agreeable to therapy. Pt received semi-reclined in bed with no c/o pain. Also of note, family present during session for scheduled family education. Mother noted that she was a past caregiver for husband since 12-May-2010 and who recently passed away in 2021/05/12. Mother noted that home was fully modified with hoyer lifts on ceiling, wide entry ways, and ramp. Session focused on transfer education for family and continued ambulation training to promote safe home access. Pt initiated session with transfer to EOB and SPT using SBA. Pt then transferred to therapy gym and completed 3x ambulation of ~132f + ~1010f+ ~17559fith MinA + RW. Of note, pt displayed narrow BOS during amb that was mitigated with VC. Pt had moderate to good control of RW. Pt then returned to room where mother demonstrated ability for SBA during SPT to bed. At end of session, pt was left semi-reclined in bed with alarm engaged, nurse call bell and all needs in reach.     Therapy  Documentation Precautions:  Precautions Precautions: Fall Precaution Comments: L inattention, L hemiparesis, L lean, hx of seizures Restrictions Weight Bearing Restrictions: No General:     Therapy/Group: Individual Therapy  KirMarquette OldT, DPT 12/11/2021, 4:28 PM

## 2021-12-11 NOTE — Progress Notes (Signed)
Speech Language Pathology Daily Session Note  Patient Details  Name: Frank Warner MRN: [id number] Date of Birth: 05-16-1980  Today's Date: 12/11/2021 SLP Individual Time: 1300-1345 SLP Individual Time Calculation (min): 45 min  Short Term Goals: Week 2: SLP Short Term Goal 1 (Week 2): Patient will consume current diet with minimal overt s/s of aspiration with min A verbal cues for use of swallowing compensatory strategies. SLP Short Term Goal 2 (Week 2): Patient will demonstrate sustained attention to a functional task for 6 minutes with Mod verbal cues for redirection. SLP Short Term Goal 3 (Week 2): Patient will demonstrate functional problem solving for basic and familiar tasks with minto-mod A verbal cues. SLP Short Term Goal 4 (Week 2): Patient will identify 2-3 areas of impairment and how they may impact ability to perform ADLs/iADLs to increase awareness of deficits with mod-to-maxA verbal cues SLP Short Term Goal 5 (Week 2): Patient will recall new, daily information with min-to-mod A verbal and visual cues.  Skilled Therapeutic Interventions: Skilled treatment session focused education with the patient and pt's mother. SLP facilitated session by providing education regarding patient's current deficits and their impact on attention, problem solving, awareness, memory and overall safety. SLP reinforced the importance of 24 hour supervision to ensure safe decision making as patient has decreased awareness of deficits. At this time, mother stated she would have to figure how they would provide 24 hour supervision as many family members work during the day. SLP provided strategies to maximize attention, recall, problem solving, and overall safety with functional tasks. SLP also education on pt's current diet recommendations of dysphagia 3 textures and thin liquids with supervision for monitoring of left pocketing, impulsiveness with intake, and reduced insight into swallowing deficits. Pt required  max A verbal cues for intellectual awareness of deficits during today's discussion with his mom, as he often referenced the ability to currently walk independently (inaccurate) and how he would be able to go back to work (tomorrow if he wanted). Pt demonstrated incomplete understanding of education, however mother verbalized understanding of education. SLP provided handouts to reinforce information.      Pain Pain Assessment Pain Scale: 0-10 Pain Score: 0-No pain  Therapy/Group: Individual Therapy  Raizy Auzenne T Hazelle Woollard 12/11/2021, 1:47 PM

## 2021-12-12 NOTE — Progress Notes (Signed)
PROGRESS NOTE   Subjective/Complaints: Alert, makes good eye contact Patient's chart reviewed- No issues reported overnight Vitals signs stable except for bradycardia  Denies pain  Review of Systems  Constitutional:  Negative for chills and fever.  HENT:  Negative for congestion.   Eyes:  Negative for double vision.  Respiratory:  Negative for shortness of breath.   Cardiovascular:  Negative for chest pain.  Gastrointestinal:  Negative for abdominal pain, constipation, diarrhea, nausea and vomiting.  Neurological:  Positive for weakness. Negative for headaches.    Objective:   No results found. No results for input(s): "WBC", "HGB", "HCT", "PLT" in the last 72 hours.  No results for input(s): "NA", "K", "CL", "CO2", "GLUCOSE", "BUN", "CREATININE", "CALCIUM" in the last 72 hours.   Intake/Output Summary (Last 24 hours) at 12/12/2021 0902 Last data filed at 12/12/2021 0827 Gross per 24 hour  Intake 337 ml  Output 400 ml  Net -63 ml         Physical Exam: Vital Signs Blood pressure 139/74, pulse (!) 54, temperature (!) 97.5 F (36.4 C), temperature source Oral, resp. rate 18, height 6' (1.829 m), weight 95.7 kg, SpO2 98 %.   General: No acute distress Mood and affect are appropriate Heart: Bradycardia Lungs: Clear to auscultation, breathing unlabored, no rales or wheezes Abdomen: Positive bowel sounds, soft nontender to palpation, nondistended Extremities: No clubbing, cyanosis, or edema Skin: No evidence of breakdown, no evidence of rash  Sensation intact LT LUE and LLE  Strength 5/5 RUE and RLE, 4 to 4+/5 LLE and 2- Elbow flexors o/w 0/ 5 LUE Sitting balance is fair  Assessment/Plan: 1. Functional deficits which require 3+ hours per day of interdisciplinary therapy in a comprehensive inpatient rehab setting. Physiatrist is providing close team supervision and 24 hour management of active medical problems  listed below. Physiatrist and rehab team continue to assess barriers to discharge/monitor patient progress toward functional and medical goals  Care Tool:  Bathing    Body parts bathed by patient: Left arm, Chest, Abdomen, Front perineal area, Buttocks, Right upper leg, Left upper leg, Right lower leg, Left lower leg, Face   Body parts bathed by helper: Right arm     Bathing assist Assist Level: Minimal Assistance - Patient > 75%     Upper Body Dressing/Undressing Upper body dressing   What is the patient wearing?: Pull over shirt    Upper body assist Assist Level: Moderate Assistance - Patient 50 - 74%    Lower Body Dressing/Undressing Lower body dressing      What is the patient wearing?: Pants, Incontinence brief     Lower body assist Assist for lower body dressing: Moderate Assistance - Patient 50 - 74%     Toileting Toileting    Toileting assist Assist for toileting: Minimal Assistance - Patient > 75%     Transfers Chair/bed transfer  Transfers assist     Chair/bed transfer assist level: Contact Guard/Touching assist     Locomotion Ambulation   Ambulation assist      Assist level: Moderate Assistance - Patient 50 - 74% Assistive device: Cane-quad Max distance: 100   Walk 10 feet activity   Assist  Assist level: Moderate Assistance - Patient - 50 - 74% Assistive device: Cane-quad   Walk 50 feet activity   Assist Walk 50 feet with 2 turns activity did not occur: Safety/medical concerns  Assist level: Moderate Assistance - Patient - 50 - 74% Assistive device: Cane-quad    Walk 150 feet activity   Assist Walk 150 feet activity did not occur: Safety/medical concerns  Assist level: Moderate Assistance - Patient - 50 - 74% Assistive device: Walker-rolling    Walk 10 feet on uneven surface  activity   Assist Walk 10 feet on uneven surfaces activity did not occur: Safety/medical concerns         Wheelchair     Assist  Is the patient using a wheelchair?: Yes (Pt transported to and from main therapy gym via TIS WC for time management.) Type of Wheelchair: Manual    Wheelchair assist level: Dependent - Patient 0% Max wheelchair distance: 150    Wheelchair 50 feet with 2 turns activity    Assist        Assist Level: Dependent - Patient 0%   Wheelchair 150 feet activity     Assist      Assist Level: Dependent - Patient 0%   Blood pressure 139/74, pulse (!) 54, temperature (!) 97.5 F (36.4 C), temperature source Oral, resp. rate 18, height 6' (1.829 m), weight 95.7 kg, SpO2 98 %.  Medical Problem List and Plan: 1. 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  Antithrombotics: -DVT/anticoagulation:  Pharmaceutical: Lovenox             -antiplatelet therapy: DAPT x 3 weeks (Plavix thru 10/25) followed by ASA alone.  3. Pain Management: Tylenol prn.  4. Mood/Behavior/Sleep: LCSW to follow for evaluation and support.              -antipsychotic agents: N/A 5. Neuropsych/cognition: This patient is not capable of making decisions on his own behalf. 6. Skin/Wound Care: routine pressure relief measures.              --continue supplements TID 7. Fluids/Electrolytes/Nutrition: Monitor I/O. Check lytes in am.             --will add Kdur X 2 days to prevent recurrent hypokalemia      Latest Ref Rng & Units 12/06/2021    5:41 AM 12/03/2021    4:30 AM 11/30/2021    4:30 AM  BMP  Glucose 70 - 99 mg/dL 85  95  95   BUN 6 - 20 mg/dL 14  14  16    Creatinine 0.61 - 1.24 mg/dL  0.53  9.76   Sodium 135 - 145 mmol/L 134  133  136   Potassium 3.5 - 5.1 mmol/L 4.0  4.1  3.5   Chloride 98 - 111 mmol/L 98  98  105   CO2 22 - 32 mmol/L 23  24  22     Calcium 8.9 - 10.3 mg/dL 7.34  9.9  9.3     8. Seizures: Continue Keppra 1500 mg BID             --  GTC at home on presentation, cEEG 9/22-23 showed no epileptiform discharges.  9. ABLA: Resolved 10. HTN: Monitor BP TID--controlled off meds Vitals:   12/11/21 2143 12/12/21 0344  BP: 125/83 139/74  Pulse: (!) 56 (!) 54  Resp: 18 18  Temp: 97.8 F (36.6 C) (!) 97.5 F (36.4 C)  SpO2: 98% 98%   Hx HTN but BPs ok, other CVA risk factors hx of smoking , ETOH  11. Hyperlipidemia: LDL 162--continue Crestor 40 mg/day 12. Leucocytosis: Downtrending, 15. Monitor trend. Fevers resolving.  -Current workup: CXR neg 9/30, UA negative, Lower extremity doppler negative for DVT  -WBC stable at 15.7 10/6  -Recheck Monday, no signs or symptoms of infection 13. Constipation refusing miralax most doses        -- LBM 10/12- change Miralax to prn 14. Bradycardia: continue to monitor HR TID  LOS: 10 days A FACE TO FACE EVALUATION WAS PERFORMED  Keriana Sarsfield P Nicko Daher 12/12/2021, 9:02 AM

## 2021-12-12 NOTE — Progress Notes (Signed)
Occupational Therapy Session Note  Patient Details  Name: Frank Warner MRN: [id number] Date of Birth: [birth year]  Today's Date: 12/12/2021 OT Individual Time: 1355-1455 OT Individual Time Calculation (min): 60 min    Short Term Goals: Week 2:  OT Short Term Goal 1 (Week 2): pt will complete 1/3 parts of toileting with MINA OT Short Term Goal 2 (Week 2): pt will demonstrate emergent awareness during functional mobility tasks OT Short Term Goal 3 (Week 2): pt will complete sit>stands with CGA with LRAD  Skilled Therapeutic Interventions/Progress Updates:  Pt greeted supine in bed with visitor present, pt  agreeable to OT intervention. Session focus on BADL reeducation, functional mobility, dynamic standing balance and decreasing overall caregiver burden.               Pt requires increased time to initiate getting OOB but can complete task with CGA. Pt completed ambulatory transfer to bathroom with no AD and CGA. Pt completed toileting with CGA, + BM. Pt entered shower with CGA. Pt completed bathing from shower seat with MINA needing assist to wash RUE, pt able to stand to wash buttock and working on using LUE to wash LLE. Pt exited shower with CGA to sit on [facility] for dressing. Pt donned [place] shirt with MINA, only needing assist for LUE. Pt donned pants and brief with CGA with + time. Pt ambulated from bathroom to sink with CGA, pt stood at sink for oral care with supervison, using hemi techniques as needed.       pt left supine in bed with all needs within reach and bed alarm activated.                        Therapy Documentation Precautions:  Precautions Precautions: Fall Precaution Comments: L inattention, L hemiparesis, L lean, hx of seizures Restrictions Weight Bearing Restrictions: No  Pain: no pain    Therapy/Group: Individual Therapy  Precious Haws 12/12/2021, 3:50 PM

## 2021-12-13 LAB — CBC
HCT: 42.2 % (ref 39.0–52.0)
Hemoglobin: 14 g/dL (ref 13.0–17.0)
MCH: 33 pg (ref 26.0–34.0)
MCHC: 33.2 g/dL (ref 30.0–36.0)
MCV: 99.5 fL (ref 80.0–100.0)
Platelets: 428 10*3/uL — ABNORMAL HIGH (ref 150–400)
RBC: 4.24 MIL/uL (ref 4.22–5.81)
RDW: 13.8 % (ref 11.5–15.5)
WBC: 8.2 10*3/uL (ref 4.0–10.5)
nRBC: 0 % (ref 0.0–0.2)

## 2021-12-13 LAB — BASIC METABOLIC PANEL
Anion gap: 9 (ref 5–15)
BUN: 8 mg/dL (ref 6–20)
CO2: 26 mmol/L (ref 22–32)
Calcium: 9.7 mg/dL (ref 8.9–10.3)
Chloride: 103 mmol/L (ref 98–111)
Creatinine, Ser: 1.01 mg/dL (ref 0.61–1.24)
GFR, Estimated: >60 mL/min (ref >60)
Glucose, Bld: 90 mg/dL (ref 70–99)
Potassium: 4.2 mmol/L (ref 3.5–5.1)
Sodium: 138 mmol/L (ref 135–145)

## 2021-12-13 NOTE — Progress Notes (Signed)
PROGRESS NOTE   Subjective/Complaints:  No issues overnite, good standing balance with OT   Denies pain  Review of Systems  Constitutional:  Negative for chills and fever.  HENT:  Negative for congestion.   Eyes:  Negative for double vision.  Respiratory:  Negative for shortness of breath.   Cardiovascular:  Negative for chest pain.  Gastrointestinal:  Negative for abdominal pain, constipation, diarrhea, nausea and vomiting.  Neurological:  Positive for weakness. Negative for headaches.    Objective:   No results found. Recent Labs    12/13/21 0546  WBC 8.2  HGB 14.0  HCT 42.2  PLT 428*    Recent Labs    12/13/21 0546  NA 138  K 4.2  CL 103  CO2 26  GLUCOSE 90  BUN 8  CREATININE 1.01  CALCIUM 9.7     Intake/Output Summary (Last 24 hours) at 12/13/2021 0919 Last data filed at 12/13/2021 0900 Gross per 24 hour  Intake 716 ml  Output --  Net 716 ml         Physical Exam: Vital Signs Blood pressure (!) 140/90, pulse (!) 50, temperature 98.3 F (36.8 C), temperature source Oral, resp. rate 18, height 6' (1.829 m), weight 95.7 kg, SpO2 100 %.   General: No acute distress Mood and affect are appropriate Heart: Bradycardia Lungs: Clear to auscultation, breathing unlabored, no rales or wheezes Abdomen: Positive bowel sounds, soft nontender to palpation, nondistended Extremities: No clubbing, cyanosis, or edema Skin: No evidence of breakdown, no evidence of rash  Sensation intact LT LUE and LLE  Strength 5/5 RUE and RLE, 4 to 4+/5 LLE and 2- Elbow flexors o/w 0/ 5 LUE Sitting balance is fair  Assessment/Plan: 1. Functional deficits which require 3+ hours per day of interdisciplinary therapy in a comprehensive inpatient rehab setting. Physiatrist is providing close team supervision and 24 hour management of active medical problems listed below. Physiatrist and rehab team continue to assess  barriers to discharge/monitor patient progress toward functional and medical goals  Care Tool:  Bathing    Body parts bathed by patient: Left arm, Chest, Abdomen, Front perineal area, Buttocks, Right upper leg, Left upper leg, Right lower leg, Left lower leg, Face   Body parts bathed by helper: Right arm     Bathing assist Assist Level: Minimal Assistance - Patient > 75%     Upper Body Dressing/Undressing Upper body dressing   What is the patient wearing?: Pull over shirt    Upper body assist Assist Level: Minimal Assistance - Patient > 75%    Lower Body Dressing/Undressing Lower body dressing      What is the patient wearing?: Pants, Incontinence brief     Lower body assist Assist for lower body dressing: Contact Guard/Touching assist     Toileting Toileting    Toileting assist Assist for toileting: Minimal Assistance - Patient > 75%     Transfers Chair/bed transfer  Transfers assist     Chair/bed transfer assist level: Contact Guard/Touching assist     Locomotion Ambulation   Ambulation assist      Assist level: Moderate Assistance - Patient 50 - 74% Assistive device: Cane-quad Max distance: 100  Walk 10 feet activity   Assist     Assist level: Moderate Assistance - Patient - 50 - 74% Assistive device: Cane-quad   Walk 50 feet activity   Assist Walk 50 feet with 2 turns activity did not occur: Safety/medical concerns  Assist level: Moderate Assistance - Patient - 50 - 74% Assistive device: Cane-quad    Walk 150 feet activity   Assist Walk 150 feet activity did not occur: Safety/medical concerns  Assist level: Moderate Assistance - Patient - 50 - 74% Assistive device: Walker-rolling    Walk 10 feet on uneven surface  activity   Assist Walk 10 feet on uneven surfaces activity did not occur: Safety/medical concerns         Wheelchair     Assist Is the patient using a wheelchair?: Yes (Pt transported to and from main  therapy gym via TIS WC for time management.) Type of Wheelchair: Manual    Wheelchair assist level: Dependent - Patient 0% Max wheelchair distance: 150    Wheelchair 50 feet with 2 turns activity    Assist        Assist Level: Dependent - Patient 0%   Wheelchair 150 feet activity     Assist      Assist Level: Dependent - Patient 0%   Blood pressure (!) 140/90, pulse (!) 50, temperature 98.3 F (36.8 C), temperature source Oral, resp. rate 18, height 6' (1.829 m), weight 95.7 kg, SpO2 100 %.  Medical Problem List and Plan: 1. 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  Antithrombotics: -DVT/anticoagulation:  Pharmaceutical: Lovenox             -antiplatelet therapy: DAPT x 3 weeks (Plavix thru 10/25) followed by ASA alone.  3. Pain Management: Tylenol prn.  4. Mood/Behavior/Sleep: LCSW to follow for evaluation and support.              -antipsychotic agents: N/A 5. Neuropsych/cognition: This patient is not capable of making decisions on his own behalf. 6. Skin/Wound Care: routine pressure relief measures.              --continue supplements TID 7. Fluids/Electrolytes/Nutrition: Monitor I/O. Check lytes in am.             --hypoK resolved off KCL supplements      Latest Ref Rng & Units 12/13/2021    5:46 AM 12/06/2021    5:41 AM 12/03/2021    4:30 AM  BMP  Glucose 70 - 99 mg/dL 90  85  95   BUN 6 - 20 mg/dL 8  14  14    Creatinine 0.61 - 1.24 mg/dL 1.01  1.22  1.03   Sodium 135 - 145 mmol/L 138  134  133   Potassium 3.5 - 5.1 mmol/L 4.2  4.0  4.1   Chloride 98 - 111 mmol/L 103  98  98   CO2 22 - 32 mmol/L 26  23  24    Calcium 8.9 - 10.3 mg/dL 9.7  10.1  9.9     8. Seizures: Continue Keppra 1500 mg BID              --  GTC at home on presentation, cEEG 9/22-23 showed no epileptiform discharges.  9. ABLA: Resolved 10. HTN: Monitor BP TID--controlled off meds Vitals:   12/12/21 1955 12/13/21 0329  BP: (!) 128/90 (!) 140/90  Pulse: 65 (!) 50  Resp: 18 18  Temp: 98.4 F (36.9 C) 98.3 F (36.8 C)  SpO2: 99% 100%   Hx HTN but BPs ok, other CVA risk factors hx of smoking , ETOH  11. Hyperlipidemia: LDL 162--continue Crestor 40 mg/day 12. Leucocytosis: Downtrending, 15. Monitor trend. Fevers resolving.  -Current workup: CXR neg 9/30, UA negative, Lower extremity doppler negative for DVT  -WBC stable at 15.7 10/6  -Recheck Monday, no signs or symptoms of infection 13. Constipation refusing miralax most doses        -- LBM 10/15- per pt report 14. Bradycardia: continue to monitor HR TID- asymptomatic, reviewed EKG 11/19/21, essentially normal   LOS: 11 days A FACE TO [place] Degan Hanser 12/13/2021, 9:19 AM

## 2021-12-13 NOTE — Progress Notes (Signed)
Occupational Therapy Session Note  Patient Details  Name: Frank Warner MRN: [id number] Date of Birth: [birth year]  Today's Date: 12/13/2021 OT Individual Time: 0900-1000 OT Individual Time Calculation (min): 60 min    Short Term Goals: Week 2:  OT Short Term Goal 1 (Week 2): pt will complete 1/3 parts of toileting with MINA OT Short Term Goal 2 (Week 2): pt will demonstrate emergent awareness during functional mobility tasks OT Short Term Goal 3 (Week 2): pt will complete sit>stands with CGA with LRAD  Skilled Therapeutic Interventions/Progress Updates:  Pt awake in bed upon OT arrival for am session. Pt agreeable for shower re-training session with progression of safe and anticipatory awareness for standing and transfers. Pt moved from bed to EOB with S, Sit to stand for SPT with CGA and transfer bed to w/c with CGA and min cues for pace. OT mobilized w/c in and out of bathroom due to TIS. Pt transferred SPT off and on shower bench with bars with min A due to complexity of environment. Min cues to allow for safety measures. Pt bathed UB/LB with min A and hemi techniques and used grab bar for support and boy when standing to use R UE for peri and buttocks region. Taken sink side for oral care and deoderant with min a due to one handed techniques, gown donning with min A and incontinence brief as well. One leg cross over technique for slipper socks with set up. Placed pt gravity minimized for AAROM and PNF techniques to L UE with + response for 1/4 ranges prox and triceps. Left pt to rest bed level as per request with needs, call button and bed exit active.   Therapy Documentation Precautions:  Precautions Precautions: Fall Precaution Comments: L inattention, L hemiparesis, L lean, hx of seizures Restrictions Weight Bearing Restrictions: No    Therapy/Group: Individual Therapy  [name] 12/13/2021, 7:46 AM

## 2021-12-13 NOTE — Progress Notes (Signed)
Physical Therapy Session Note  Patient Details  Name: Frank Warner MRN: [id number] Date of Birth: [birth year]  Today's Date: 12/13/2021 PT Individual Time: 2993-7169 PT Individual Time Calculation (min): 25 min   Short Term Goals: Week 2:  PT Short Term Goal 1 (Week 2): Pt will perform bed mobility with SPV consistently. PT Short Term Goal 2 (Week 2): Pt will perform sit <> stand transfers with SBA and LRAD consistenly. PT Short Term Goal 3 (Week 2): Pt will perform bed <> chair transfers with SBA and LRAD consistenly. PT Short Term Goal 4 (Week 2): Pt will amb 178ft with CGA and LRAD. PT Short Term Goal 5 (Week 2): Pt will perform stair negotiaton (3 steps) with CGA and LRAD.  Skilled Therapeutic Interventions/Progress Updates:     Pt received seated in WC in gym, handed off by OT. No complaint of pain. Pt agreeable to therapy. Pt performs multiple bouts of ambulation to work on gait training and NMR of L hemibody. PT provides minA at hips for stability and to promote lateral weight shifting. Pt does not use a RW. Pt presents with frequents scuffing of L sole of shoe on ground during swing phase. Pt provides cues and demonstration of increasing push-off through L forefoot and toes at terminal stance to promote increased knee flexion and optimal swing phase. Pt able to make correction occasionally but continues to scuff L foot ~50% of time. Pt completes 2x175' with extended seated rest break. Pt attempts to perform alternating toe taps on cones to work on dynamic standing balance and coordination. Pt knocks cones over several times while attempting and verbalizes fatigue. WC transport back to room. Stand pivot to bed with CGA and cues for positioning. Pt left supine with alarm intact and all needs within reach.  Therapy Documentation Precautions:  Precautions Precautions: Fall Precaution Comments: L inattention, L hemiparesis, L lean, hx of seizures Restrictions Weight Bearing Restrictions:  No   Therapy/Group: Individual Therapy  Breck Coons, PT, DPT 12/13/2021, 4:55 PM

## 2021-12-13 NOTE — Progress Notes (Signed)
Speech Language Pathology Daily Session Note  Patient Details  Name: Frank Warner MRN: [id number] Date of Birth: 06-23-1980  Today's Date: 12/13/2021 SLP Individual Time: 69-1420 SLP Individual Time Calculation (min): 35 min  Short Term Goals: Week 2: SLP Short Term Goal 1 (Week 2): Patient will consume current diet with minimal overt s/s of aspiration with min A verbal cues for use of swallowing compensatory strategies. SLP Short Term Goal 2 (Week 2): Patient will demonstrate sustained attention to a functional task for 6 minutes with Mod verbal cues for redirection. SLP Short Term Goal 3 (Week 2): Patient will demonstrate functional problem solving for basic and familiar tasks with minto-mod A verbal cues. SLP Short Term Goal 4 (Week 2): Patient will identify 2-3 areas of impairment and how they may impact ability to perform ADLs/iADLs to increase awareness of deficits with mod-to-maxA verbal cues SLP Short Term Goal 5 (Week 2): Patient will recall new, daily information with min-to-mod A verbal and visual cues.  Skilled Therapeutic Interventions: Skilled ST treatment focused on cognitive goals. Pt was sleeping on arrival with blanket pulled over his head. Pt acknowledged therapist by briefly removing blanket from covering face, however quickly covered himself back up. Pt eventually uncovered face with ongoing encouragement. Pt exhibited decreased recall and accuracy of events from previous therapy sessions. Pt also appears to continue to overestimate his abilities. For example, pt reported walking to the end of the hallway at independent level today. However per recent PT note, pt required min A with use of RW. Pt with minimal recall of this precaution. SLP initiated memory notebook to address memory goals by improving recall from prior therapy events, a space to maintain safety precautions, therapy techniques, etc. Pt refused to assist with repositioning himself in bed in order to sit up to use  bedside table to write in notebook. Pt reported date with accurate day of week, month, and year, with sup A verbal cues for correct date. SLP wrote at the top of first page. Pt unable to recall reasoning for memory notebook following 5 minute delay. Pt eventually stopped responding to therapist, turned his head away, and closed his eyes. Pt unwilling to participate despite max encouragement. Therapy session concluded 10 minutes early d/t lack of participation. Patient was left in bed with alarm activated and immediate needs within reach at end of session. Continue per current plan of care.       Pain Pain Assessment Pain Score: 0-No pain  Therapy/Group: Individual Therapy  Patty Sermons 12/13/2021, 2:08 PM

## 2021-12-13 NOTE — Progress Notes (Signed)
Occupational Therapy Session Note  Patient Details  Name: Frank Warner MRN: [id number] Date of Birth: 1980-03-05  Today's Date: 12/13/2021 OT Individual Time: 1030-1130 OT Individual Time Calculation (min): 60 min    Short Term Goals: Week 2:  OT Short Term Goal 1 (Week 2): pt will complete 1/3 parts of toileting with MINA OT Short Term Goal 2 (Week 2): pt will demonstrate emergent awareness during functional mobility tasks OT Short Term Goal 3 (Week 2): pt will complete sit>stands with CGA with LRAD  Skilled Therapeutic Interventions/Progress Updates:    Pt received in bed, he had completed shower with earlier OT session.  Pt agreeable to dressing. Needs cues on how to align clothing to set up to use hemiplegic strategies. Pt needed mod cues and min A with shirt and pants, donned shoes with min A. Pt ambulated to wc with min A/ CGA HHA.  Pt taken to gym to focus on LUE AROM.  Used a variety of NMR exercises to facilitate sh, elbow, wrist AROM. Used single arm movements (hand over hand guiding with grasping cones, moving arms in various planes) and bilateral hands together with a ball toss activity (therapist supported L hand on dowel bar as pt hit a ball away with B hands), B ball hold with support.  Pt did need frequent encouragement to fully participate but he did attend well the entire session. He has fairly good tricep extension and developing wrist extension.  He did attend to LUE. Hand off to PT for his next session.     Therapy Documentation Precautions:  Precautions Precautions: Fall Precaution Comments: L inattention, L hemiparesis, L lean, hx of seizures Restrictions Weight Bearing Restrictions: No  Pain: Pain Assessment Pain Score: 0-No pain       Therapy/Group: Individual Therapy  [place] 12/13/2021, 12:59 PM

## 2021-12-14 NOTE — Progress Notes (Signed)
Speech Language Pathology Daily Session Note  Patient Details  Name: Frank Warner MRN: [id number] Date of Birth: [birth year]  Today's Date: 12/14/2021 SLP Individual Time: 1300-1330 SLP Individual Time Calculation (min): 30 min  Short Term Goals: Week 2: SLP Short Term Goal 1 (Week 2): Patient will consume current diet with minimal overt s/s of aspiration with min A verbal cues for use of swallowing compensatory strategies. SLP Short Term Goal 2 (Week 2): Patient will demonstrate sustained attention to a functional task for 6 minutes with Mod verbal cues for redirection. SLP Short Term Goal 3 (Week 2): Patient will demonstrate functional problem solving for basic and familiar tasks with minto-mod A verbal cues. SLP Short Term Goal 4 (Week 2): Patient will identify 2-3 areas of impairment and how they may impact ability to perform ADLs/iADLs to increase awareness of deficits with mod-to-maxA verbal cues SLP Short Term Goal 5 (Week 2): Patient will recall new, daily information with min-to-mod A verbal and visual cues.  Skilled Therapeutic Interventions: Skilled ST treatment focused on cognitive and swallowing goals. SLP facilitated use of pt's memory notebook by providing sup A verbal cues for day of week + date. Pt wrote date at top of new page. Pt summarized previous events from therapy with sup A verbal cues for thoroughness. Pt stated "I forgot that notebook was even sitting there." SLP placed sign outside of pt's door to encourage/remind staff to encourage pt to write in notebook during each therapy session if possible.   SLP facilitated ongoing education regarding current diet and swallowing precautions per pt's inquiries. Pt continues to exhibit minimal awareness of deficits and denied current swallowing difficulty. SLP acknowledged improvements, however discussed ongoing oral deficits impacting mastication effectiveness, left anterior spillage, and increased risk for pocketing . Pt verbalized  understanding. Pt voiced frustration with full supervision status. SLP educated pt on reasoning. Pt agreeable to PO trials for ongoing assessment of diet tolerance. Pt consumed dys 3 textures with mildly prolonged mastication, good oral clearance, and without overt s/sx of aspiration. Pt consumed trial with appropriate rate of consumption and bite sizes. Per nurse tech who provided full supervision during breakfast and lunch today, pt consumed meal without evidence of impulsivity, and exhibited minimal difficulty nor s/sx of aspiration. SLP recommends set-up assist and intermittent supervision at this time. Both of and NT were educated and verbalized understanding. Plan for regular texture trial tray prior to consideration of diet advancement as tolerated.     Patient was left in bed with alarm activated and immediate needs within reach at end of session. Continue per current plan of care.    Pain  None/denied  Therapy/Group: Individual Therapy  Patty Sermons 12/14/2021, 9:40 PM

## 2021-12-14 NOTE — Progress Notes (Signed)
PROGRESS NOTE   Subjective/Complaints: Pt in bed no c/os, looking at phone , does not engage in conversation   Denies pain  Review of Systems  Constitutional:  Negative for chills and fever.  HENT:  Negative for congestion.   Eyes:  Negative for double vision.  Respiratory:  Negative for shortness of breath.   Cardiovascular:  Negative for chest pain.  Gastrointestinal:  Negative for abdominal pain, constipation, diarrhea, nausea and vomiting.  Musculoskeletal:  Negative for myalgias.  Neurological:  Positive for weakness. Negative for headaches.    Objective:   No results found. Recent Labs    12/13/21 0546  WBC 8.2  HGB 14.0  HCT 42.2  PLT 428*    Recent Labs    12/13/21 0546  NA 138  K 4.2  CL 103  CO2 26  GLUCOSE 90  BUN 8  CREATININE 1.01  CALCIUM 9.7     Intake/Output Summary (Last 24 hours) at 12/14/2021 0837 Last data filed at 12/14/2021 0700 Gross per 24 hour  Intake 600 ml  Output 500 ml  Net 100 ml         Physical Exam: Vital Signs Blood pressure 99/66, pulse 61, temperature 98 F (36.7 C), temperature source Oral, resp. rate 18, height 6' (1.829 m), weight 95.7 kg, SpO2 98 %.   General: No acute distress Mood and affect are appropriate Heart: Bradycardia Lungs: Clear to auscultation, breathing unlabored, no rales or wheezes Abdomen: Positive bowel sounds, soft nontender to palpation, nondistended Extremities: No clubbing, cyanosis, or edema Skin: No evidence of breakdown, no evidence of rash  Sensation intact LT LUE and LLE  Strength 5/5 RUE and RLE, 4 to 4+/5 LLE and 2- Elbow flexors o/w 0/ 5 LUE Sitting balance is fair  Assessment/Plan: 1. Functional deficits which require 3+ hours per day of interdisciplinary therapy in a comprehensive inpatient rehab setting. Physiatrist is providing close team supervision and 24 hour management of active medical problems listed  below. Physiatrist and rehab team continue to assess barriers to discharge/monitor patient progress toward functional and medical goals  Care Tool:  Bathing    Body parts bathed by patient: Left arm, Chest, Abdomen, Front perineal area, Buttocks, Right upper leg, Left upper leg, Right lower leg, Left lower leg, Face   Body parts bathed by helper: Right arm     Bathing assist Assist Level: Minimal Assistance - Patient > 75%     Upper Body Dressing/Undressing Upper body dressing   What is the patient wearing?: Pull over shirt    Upper body assist Assist Level: Minimal Assistance - Patient > 75%    Lower Body Dressing/Undressing Lower body dressing      What is the patient wearing?: Pants, Incontinence brief     Lower body assist Assist for lower body dressing: Contact Guard/Touching assist     Toileting Toileting    Toileting assist Assist for toileting: Minimal Assistance - Patient > 75%     Transfers Chair/bed transfer  Transfers assist     Chair/bed transfer assist level: Contact Guard/Touching assist     Locomotion Ambulation   Ambulation assist      Assist level: Moderate Assistance -  Patient 50 - 74% Assistive device: Cane-quad Max distance: 100   Walk 10 feet activity   Assist     Assist level: Moderate Assistance - Patient - 50 - 74% Assistive device: Cane-quad   Walk 50 feet activity   Assist Walk 50 feet with 2 turns activity did not occur: Safety/medical concerns  Assist level: Moderate Assistance - Patient - 50 - 74% Assistive device: Cane-quad    Walk 150 feet activity   Assist Walk 150 feet activity did not occur: Safety/medical concerns  Assist level: Moderate Assistance - Patient - 50 - 74% Assistive device: Walker-rolling    Walk 10 feet on uneven surface  activity   Assist Walk 10 feet on uneven surfaces activity did not occur: Safety/medical concerns         Wheelchair     Assist Is the patient using  a wheelchair?: Yes (Pt transported to and from main therapy gym via TIS WC for time management.) Type of Wheelchair: Manual    Wheelchair assist level: Dependent - Patient 0% Max wheelchair distance: 150    Wheelchair 50 feet with 2 turns activity    Assist        Assist Level: Dependent - Patient 0%   Wheelchair 150 feet activity     Assist      Assist Level: Dependent - Patient 0%   Blood pressure 99/66, pulse 61, temperature 98 F (36.7 C), temperature source Oral, resp. rate 18, height 6' (1.829 m), weight 95.7 kg, SpO2 98 %.  Medical Problem List and Plan: 1. 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  Antithrombotics: -DVT/anticoagulation:  Pharmaceutical: Lovenox             -antiplatelet therapy: DAPT x 3 weeks (Plavix thru 10/25) followed by ASA alone.  3. Pain Management: Tylenol prn.  4. Mood/Behavior/Sleep: LCSW to follow for evaluation and support.              -antipsychotic agents: N/A 5. Neuropsych/cognition: This patient is not capable of making decisions on his own behalf. 6. Skin/Wound Care: routine pressure relief measures.              --continue supplements TID 7. Fluids/Electrolytes/Nutrition: Monitor I/O. Check lytes in am.             --hypoK resolved off KCL supplements      Latest Ref Rng & Units 12/13/2021    5:46 AM 12/06/2021    5:41 AM 12/03/2021    4:30 AM  BMP  Glucose 70 - 99 mg/dL 90  85  95   BUN 6 - 20 mg/dL 8  14  14    Creatinine 0.61 - 1.24 mg/dL  1.76  1.60   Sodium 135 - 145 mmol/L 138  134  133   Potassium 3.5 - 5.1 mmol/L 4.2  4.0  4.1   Chloride 98 - 111 mmol/L 103  98  98   CO2 22 - 32 mmol/L 26  23  24    Calcium 8.9 - 10.3 mg/dL 9.7   7.37  9.9     8. Seizures: Continue Keppra 1500 mg BID             -- GTC at home on presentation, cEEG 9/22-23 showed no epileptiform discharges.  9. ABLA: Resolved 10. HTN: Monitor BP TID--controlled off meds Vitals:   12/13/21 2006 12/14/21 0604  BP: 119/82 99/66  Pulse: (!) 56 61  Resp: 18 18  Temp: 98.2 F (36.8 C) 98 F (36.7 C)  SpO2: 100% 98%   Hx HTN but BPs ok, other CVA risk factors hx of smoking , ETOH  11. Hyperlipidemia: LDL 162--continue Crestor 40 mg/day 12. Leucocytosis: Downtrending, 15. Monitor trend. Fevers resolving.  -Current workup: CXR neg 9/30, UA negative, Lower extremity doppler negative for DVT  -WBC stable at 15.7 10/6  -Recheck Monday, no signs or symptoms of infection 13. Constipation refusing miralax most doses        -- LBM 10/15- per pt report 14. Bradycardia: continue to monitor HR TID- asymptomatic, reviewed EKG 11/19/21, essentially normal   LOS: 12 days A FACE TO FACE EVALUATION WAS PERFORMED  Erick Colace 12/14/2021, 8:37 AM
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
## 2021-12-14 NOTE — Progress Notes (Signed)
Physical Therapy Session Note  Patient Details  Name: Frank Warner MRN: [id number] Date of Birth: 08-01-1980  Today's Date: 12/14/2021 PT Individual Time: 0092-3300 and 7622 - 6333 PT Individual Time Calculation (min): 45 min and 74 min   Short Term Goals: Week 2:  PT Short Term Goal 1 (Week 2): Pt will perform bed mobility with SPV consistently. PT Short Term Goal 2 (Week 2): Pt will perform sit <> stand transfers with SBA and LRAD consistenly. PT Short Term Goal 3 (Week 2): Pt will perform bed <> chair transfers with SBA and LRAD consistenly. PT Short Term Goal 4 (Week 2): Pt will amb 186f with CGA and LRAD. PT Short Term Goal 5 (Week 2): Pt will perform stair negotiaton (3 steps) with CGA and LRAD.  Skilled Therapeutic Interventions/Progress Updates:     Session 1:  Pt greeted supine in bed and agreeable to therapy. No c/o pain   Supine<> sit with SBA for L UE management.   Sit<> stand [facility]OB at beginning of session and [facility] with CGA and no cuing needed.   Gait training:  2049fx 2 with no AD and CGA and occasional min assist for balance.   Pt demonstrating the following gait deviations and SPT providing the described cuing and facilitation for improvement:   - frequent slight dragging of L foot during swing through phase with cues to increase L knee and hip flexion to completely clear L foot of the ground  -scissoring gait pattern  -unsteadiness with weaving and occasional LOB, especially when presented with a distraction  - Decreased awareness of L side of environment with frequent verbal cues for obstacles coming up on the L that pt is not aware of.   Ramp negotiation x 2 with CGA and continued weaving and scissoring gait pattern with decreased L sided awareness. Continued cues to increase awareness of L environment.   Car transfer x 1 with CGA. SPT demonstrated technique prior to pt performing. Pt required no cues or corrections.   Pt left supine in  bed with HOB elevated, call bell in reach, bed alarm on, all needs in reach.    Session 2:  Pt greeted supine in bed and agreeable to therapy. No c/o pain. Increased time and motivation required for pt to initial mobility.  Min assist for supine to sit (assist needed for increased motivation to sit up on EOB) and CGA for sit to stand from EOB, amb [id number] WC and stand to sit to WC.   Transported pt down to hospital courtyard to initiate gait training on uneven surfaces and increased number of obstacles. Prior to ambulation pt was educated on gait impairments noted last session and how to improve.  Pt amb >350f74fth CGA and occasional min assist for LOB and no AD.  Pt demonstrating the following gait deviations and SPT providing the described cuing and facilitation for improvement:   - occasional slight dragging of L foot during swing through phase with cues to increase L knee and hip flexion to completely clear L foot of the ground. Improved from earlier session.  -scissoring gait pattern, especially when distracted.  -unsteadiness with weaving and occasional LOB, especially when presented with a distraction  - Decreased awareness of L side of environment with frequent verbal cues for obstacles coming up on the L that pt is not aware of.   Challenged L awareness by asking patient to seek out certain objects or places throughout the hospital lobby and  courtyard that were located on his left side.   Pt amb from lobby back up to main therapy gym, ~354f, with CGA and occasional min assist for LOB and with use of the elevator.   Pt took a seated rest break before performing the following high level gait activities:  -Power walking: 2 laps (457flap) with bilateral HHA + 2 and progressing to HHA + 1 for additional 2 laps. Improved gait pattern with decreased scissoring and improved L swing through noted with power walking.  -backwards walking: 2 laps (4029fap) B HHA +2 for balance and cues to  increase backward step length of L LE.  - Side stepping to the left: while in a mini squat with B HHA from DPT and SPT guarding at waist with CGA. 1 lap   After 1 lap of side stepping pt had urge incontinence, so further high level gait training was discontinued.   Trans[facility] pt back to [facility]. Assisted pt with person hygiene tasks which pt was able to perform with min assist. Dynamic standing balance challenged with CGA during washing and dressing.   Pt left seated in WC with brakes on, call bell in reach, posey belt on, set up for dinner, and all needs met.    Therapy Documentation Precautions:  Precautions Precautions: Fall Precaution Comments: L inattention, L hemiparesis, L lean, hx of seizures Restrictions Weight Bearing Restrictions: No   Therapy/Group: Individual Therapy  AnnKayleen MemosPT 12/14/2021, 8:05 AM

## 2021-12-14 NOTE — Progress Notes (Signed)
Occupational Therapy Session Note  Patient Details  Name: Frank Warner MRN: [id number] Date of Birth: [birth year]  Today's Date: 12/14/2021 OT Individual Time: 1000-1100 OT Individual Time Calculation (min): 60 min    Short Term Goals: Week 1:  OT Short Term Goal 1 (Week 1): Pt will increase UB dressing while seated on EOB or at sink with Mod A utilizing hemi dressing techniques. OT Short Term Goal 1 - Progress (Week 1): Met OT Short Term Goal 2 (Week 1): Pt will increase LB dressing while seated on EOB or at sink with Mod A utilizing hemi dressing techniques. OT Short Term Goal 2 - Progress (Week 1): Met OT Short Term Goal 3 (Week 1): Pt will increase dynamic sitting balance to SBA while completing self care tasks such as LB dressing and bathing. OT Short Term Goal 3 - Progress (Week 1): Met  Skilled Therapeutic Interventions/Progress Updates:     Pt received in room laying in bed motivated to participate in therapy. Pt reported he would like to shower. Pt supine to sitting EOB close supervision. Pt ambulated to bathroom with RW CGA. Pt was able to lift his LUE to place it onto the walker following a verbal cue and with increased time. Pt donned his shirt with CGA while seated on elevated toilet seat and doffed his pants with min A to maintain balance in standing. Pt demonstrated mild loss of balance in standing due to turning quickly and not keeping LLE within his base of support. Pt educated on hemiplegia following stroke and maintaining safety. Pt ambulated to shower with min A HHA and transferred to tub bench. Pt completed UB bathing with mod A wash his R arm and LB bathing with min A. Pt was able to maintain dynamic sitting balance while reaching towards feet with min A. Pt donned his shirt using hemi strategy with min A and moderate verbal cueing. Pt donned pants while seated with min A and moderate VB cueing on hemi dressing strategy. Pt requested lotion and applied lotion to his LUE with  supervision and to his RUE with moderate assistance. Max motivational cues required to encourage Pt to use his LUE to apply lotion to his RUE. Pt initiated elbow flexion and extension to rub in lotion and transitioned to [facility] from therapist due to Pt reporting his arm was tired. Pt was transported to therapy gym where he participated in e-stim NMR of LUE.  1:1 NMES applied to L tricep to help improve elbow extension. Pt completed simulated cleaning task with moderate tricep engagement with NMES and mild bicep engagement.   Ratio 1:3 Rate 35 pps Waveform- Asymmetric Ramp 1.0 Pulse 300 Intensity-  Duration -     Report of pain at the beginning of session: None Report of pain at the end of session: None  No adverse reactions after treatment and is skin intact.    Pt was transported back to his room in his wc. Pt requested to brush his teeth and completed task wc level with supervision using modified strategy. Pt transferred back to his bed with min A HHA. Pt was left in his bed with bed alarm on, call bell in reach, and all needs met.    Therapy Documentation Precautions:  Precautions Precautions: Fall Precaution Comments: L inattention, L hemiparesis, L lean, hx of seizures Restrictions Weight Bearing Restrictions: No General:   Vital Signs: Therapy Vitals Temp: 98 F (36.7 C) Temp Source: Oral Pulse Rate: (Abnormal) 51 Resp: 18 BP: 117/83 Patient Position (if  appropriate): Lying Oxygen Therapy SpO2: 98 % O2 Device: Room Air     Therapy/Group: Individual Therapy  Janey Genta 12/14/2021, 4:11 PM

## 2021-12-15 ENCOUNTER — Encounter (HOSPITAL_COMMUNITY): Payer: Self-pay | Admitting: Physical Medicine & Rehabilitation

## 2021-12-15 NOTE — Patient Care Conference (Signed)
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 Claudette Laws Social Worker Present: Lavera Guise, BSW Nurse Present: Chana Bode, RN PT Present: Casimiro Needle, PT OT Present: Other (comment);Barron Schmid, COTA [place], [place]) SLP Present: Eilene Ghazi, SLP PPS Coordinator present : Fae Pippin, SLP     Current Status/Progress Goal Weekly Team Focus  Bowel/Bladder     Urge incontinence   Continent of bowel and bladder   Toileting protocol  Swallow/Nutrition/ Hydration   dys 3 diet with thin liquids  sup A  tolerance of current diet with implementation of safe swallowing precautions, regular trial tray prior to diet advancement   ADL's   CGA for sit>stand, Min a for LB bahting shower level, Mod A UB bahting shower leve, Min a stand pivot transfer with HHA, Min A UB dressing, Min a LB dressing, Min A 3/3 toileting tasks, LUE hemiparesis with some return of bicep flexion and tricep extension, impaired balance, impaired safety awareness  supervision - MIN A goals  BADL reeducation, functional mobility, dynamic balance, LUE NMR,   Mobility   L inattention biggest factor with ambulation, CGA for transfers and CGA/min assist for amb (up to 375ft with no AD), SPV for bed mobility, urge incontience x2 during therapy sessions in last 7 days.  SPV at  an ambulatory level with LRAD  Increasing L attention, gait, transfer, and stair training with LRAD, dynamic standing balance, L hemibody NMR.   Communication   goal discontinued  not actively addressing due to limited participation      Safety/Cognition/ Behavioral Observations  mod A - fluctuating participation, general resistance to SLP tx  sup-to-min A  sustained attention, memory, problem solving, and awareness of deficits   Pain     N/a        Skin     N/a          Discharge Planning:  Patient uninsured. Discharging home with mother, S.O and other family to provide 24/7   Team Discussion: Patient with left hemiparesis, left inattention , decreased insight into deficits and behavior issues post left MCA CVA.   Patient on target to meet rehab goals: Currently  needs CGA for sit - stand pivot. Needs supervision for upper body care and min assist for lower body bathing at the shower level and dressing. Completes transfers with CGA and able to ambulate up to 300' without an assistive device using min assist.  Needs min - max assist for cognition with supervision goals.   *See Care Plan and progress notes for long and short-term goals.   Revisions to Treatment Plan:  HEP Toileting protocol Regular diet trials   Teaching Needs: Safety, dietary modifications, medications, secondary risk management, etc.  Current Barriers to Discharge: Decreased caregiver support, Home enviroment access/layout, Insurance for SNF coverage, Behavior, and lack of insurance for follow up  services  Possible Resolutions to Barriers: Family education HEP program DME: shower seat [facility]     Medical Summary Current Status: Left neglect persists, increasing motor function in the left upper and left lower limb, blood pressures well controlled     Possible Resolutions to [employer] Focus: Continue with neuropsych follow-up, family training prior to discharge   Continued Need  for Acute Rehabilitation Level of Care: The patient requires daily medical management by a physician with specialized training in physical medicine and rehabilitation for the following reasons: Direction of a multidisciplinary physical rehabilitation program to maximize functional independence : Yes Medical management of patient stability for increased activity during participation in an intensive rehabilitation regime.: Yes Analysis of laboratory values and/or radiology reports with any subsequent need for medication adjustment and/or medical intervention. : Yes   I attest that I was present, lead the team conference, and concur with the assessment and plan of the team.   Dorien Chihuahua B 12/15/2021, 3:55 PM

## 2021-12-15 NOTE — Progress Notes (Signed)
Physical Therapy Session Note  Patient Details  Name: Frank Warner MRN: [id number] Date of Birth: [birth year]  Today's Date: 12/15/2021 PT Individual Time: 8003-4917 PT Individual Time Calculation (min): 60 min   Short Term Goals: Week 2:  PT Short Term Goal 1 (Week 2): Pt will perform bed mobility with SPV consistently. PT Short Term Goal 2 (Week 2): Pt will perform sit <> stand transfers with SBA and LRAD consistenly. PT Short Term Goal 3 (Week 2): Pt will perform bed <> chair transfers with SBA and LRAD consistenly. PT Short Term Goal 4 (Week 2): Pt will amb 146f with CGA and LRAD. PT Short Term Goal 5 (Week 2): Pt will perform stair negotiaton (3 steps) with CGA and LRAD.  Skilled Therapeutic Interventions/Progress Updates:     Pt greeted supine in bed and agreeable to therapy. No c/o pain. Pt continuing to require extra encouragement and motivation to sit on EOB, but less time needed today compared to previous visits.   Pt amb 1553ffrom room to main therapy gym with CGA/ occasional min assist due to L LOB. Improved attention to the left environment noted today compared to previous sessions, but cuing still needed occasional to increase L awareness. Continued scuffing of L heel during swing phase occasional as well as scissoring gait pattern occasionally. Improved from previous sessions.   Power walking with sudden turns and direction changes ~40028fith B HHA for first [id number] to L HHA and CGA with occasional min assist with L LOB mainly during sudden turning. Pt demonstrating improved L foot clearance during swing phase with increased gait speed as well as decreased scissoring gait pattern.    Obstacle course set up with goal to challenge balance, coordination, direction changes, and strengthening. Pt stepped up and over curb step, BOSU, and airex, caught and tossed ball in multiple directions, dribbled swiss ball, and carried 25# kettle bell for 20ft59fA assist provided for  most of obstacle course, min assist needed after step down off of curb step due to pt reporting his L ankle almost "gave out".   Pt dynamic balance and walking challenged again with task of holding a small swiss ball with both hands (frequent assist and cues to squeeze ball with L hand), while walking and stepping over sticks on the ground, while also reading numbers that PT was holding up in her hand. Two LOB occurred with min assist for recovery while pt was turning and trying to read numbers, otherwise CGA.   Pt amb 147ft 49f to room with L HHA and CGA with increased gait speed and improvements in L LE swing through, less scissoring gait pattern, and improved attention to L environment.   Left pt seated in WC with brakes locked, posey belt on, dinner set up, call bell in reach, all needs met.  Therapy Documentation Precautions:  Precautions Precautions: Fall Precaution Comments: L inattention, L hemiparesis, L lean, hx of seizures Restrictions Weight Bearing Restrictions: No   Therapy/Group: Individual Therapy  Frank Warner KKayleen Memos10/18/2023, 5:37 PM

## 2021-12-15 NOTE — Progress Notes (Signed)
Physical Therapy Session Note  Patient Details  Name: Frank Warner MRN: [id number] Date of Birth: 01/22/1981  Today's Date: 12/15/2021 PT Individual Time: 5456-2563 PT Individual Time Calculation (min): 45 min   Short Term Goals: Week 2:  PT Short Term Goal 1 (Week 2): Pt will perform bed mobility with SPV consistently. PT Short Term Goal 2 (Week 2): Pt will perform sit <> stand transfers with SBA and LRAD consistenly. PT Short Term Goal 3 (Week 2): Pt will perform bed <> chair transfers with SBA and LRAD consistenly. PT Short Term Goal 4 (Week 2): Pt will amb 173ft with CGA and LRAD. PT Short Term Goal 5 (Week 2): Pt will perform stair negotiaton (3 steps) with CGA and LRAD.  Skilled Therapeutic Interventions/Progress Updates: Pt presented in bed agreeable to therapy. Pt noted to required increased time to put down phone and reminders during start of sesison to focus on therapy vs phone. Pt denies pain during session. Performed supine to sit with supervision and use of bed features. Pt indicated had just used urinal therefore did not need bathroom. Pt performed stand pivot to TIS with CGA with PTA noted that pants were soiled with urine. PTA obtained new brief and pants while pt washed hands at sink with PTA encouraging washing L hand. Upon PTA's return pt performed Sit to stand with CGA and performed LB clothing management with CGA. Pt returned to sitting to remove brief/pants with minA from PTA for time management. Pt was able to perform peri-care with set up to remove any urine. PTA donned brief and pants total A for time management with pt standing in same manner as prior and requiring minA to pull pants over hips specifically on L side. Pt then donned shoes with modA due to grip socks and pt requiring assist for only L side. Pt transported to day room for time management and participated in ambulation without AD ~43ft then 122ft with CGA. Pt initially noted to ambulate with increased L lean,  narrow BOS, and decreased L foot clearance. With verbal cues pt demonstrating increased erect posture, improved BOS,however would consistently brush shoe against floor. AS pt ambulated around nsg station pt noted to bump into more objects on L with increased distractions.  Pt also participated in standing reach task for dynamic balance obtaining clothespins placed on body and placing on basketball net. Pt then removed clothespins as net was biased to L to task for scanning and attention to L. Pt continued to work on balance and gait by ambulating backwards 27ft x 2 with CGA and cues for increased BOS. Pt then ambulated ~70ft back to TIS with improved L foot clearance on this instance. Pt tranpsorted back to room and performed ambulatory transfer to bed. Pt doffed shoes with supervision and performed sit to supine with supervision. Pt left in bed at end of session with bed alarm on, call bell within reach and needs met.      Therapy Documentation Precautions:  Precautions Precautions: Fall Precaution Comments: L inattention, L hemiparesis, L lean, hx of seizures Restrictions Weight Bearing Restrictions: No General:   Vital Signs: Therapy Vitals Temp: 98.3 F (36.8 C) Temp Source: Oral Pulse Rate: 61 Resp: 18 BP: 107/65 Patient Position (if appropriate): Lying Oxygen Therapy SpO2: 100 % O2 Device: Room Air Pain:   Mobility:   Locomotion :    Trunk/Postural Assessment :    Balance:   Exercises:   Other Treatments:      Therapy/Group: Individual Therapy  Keyandre Pileggi  Malyna Budney 12/15/2021, 4:13 PM

## 2021-12-15 NOTE — Progress Notes (Signed)
Speech Language Pathology Daily Session Note  Patient Details  Name: Frank Warner MRN: [id number] Date of Birth: May 20, 1980  Today's Date: 12/15/2021 SLP Individual Time: 9024-0973 SLP Individual Time Calculation (min): 45 min  Short Term Goals: Week 2: SLP Short Term Goal 1 (Week 2): Patient will consume current diet with minimal overt s/s of aspiration with min A verbal cues for use of swallowing compensatory strategies. SLP Short Term Goal 2 (Week 2): Patient will demonstrate sustained attention to a functional task for 6 minutes with Mod verbal cues for redirection. SLP Short Term Goal 3 (Week 2): Patient will demonstrate functional problem solving for basic and familiar tasks with minto-mod A verbal cues. SLP Short Term Goal 4 (Week 2): Patient will identify 2-3 areas of impairment and how they may impact ability to perform ADLs/iADLs to increase awareness of deficits with mod-to-maxA verbal cues SLP Short Term Goal 5 (Week 2): Patient will recall new, daily information with min-to-mod A verbal and visual cues.  Skilled Therapeutic Interventions: Skilled ST treatment focused on cognitive goals. Pt was greeted in bed on arrival and required extensive coaxing to participate (~15 minutes). Pt eventually agreeable. SLP and pt completing medication management task to increase awareness of current medication regime. SLP facilitated session by providing overall sup A fading to mod I for for identifying pertinent medication information on individualized medication chart which contained the following information: name of medication, dose, instructions, and purpose for taking. SLP then facilitated session by providing overall min A fading to supervision A verbal cues for identification of intentional organization errors in daily BID pillbox, mod I for generating appropriate solutions, and sup A verbal cues for problem solving hypothetical scenarios pertaining to medication management. Patient was left in  bed with alarm activated and immediate needs within reach at end of session. Continue per current plan of care.      Pain  None/denied  Therapy/Group: Individual Therapy  Bellany Elbaum T Jhon Mallozzi 12/15/2021, 4:10 PM

## 2021-12-15 NOTE — Progress Notes (Signed)
Occupational Therapy Session Note  Patient Details  Name: Frank Warner MRN: [id number] Date of Birth: 08-10-1980  Today's Date: 12/15/2021 OT Individual Time: 1100-1200 OT Individual Time Calculation (min): 60 min    Short Term Goals: Week 1:  OT Short Term Goal 1 (Week 1): Pt will increase UB dressing while seated on EOB or at sink with Mod A utilizing hemi dressing techniques. OT Short Term Goal 1 - Progress (Week 1): Met OT Short Term Goal 2 (Week 1): Pt will increase LB dressing while seated on EOB or at sink with Mod A utilizing hemi dressing techniques. OT Short Term Goal 2 - Progress (Week 1): Met OT Short Term Goal 3 (Week 1): Pt will increase dynamic sitting balance to SBA while completing self care tasks such as LB dressing and bathing. OT Short Term Goal 3 - Progress (Week 1): Met  Skilled Therapeutic Interventions/Progress Updates:     Pt received in room resting in bed with best friend present. Pt in good spirits and not reporting any pain at the beginning of the session. Discharge plan discussed with pt with a focus on safety in the bathroom including shower set-up and DME usage. Pt confirmed he will be using a tub/shower and is receptive to using a tub transfer bench to increase safety. Pt was receptive to practicing transfers in similar environment (ADL suite). Pt was wheeled to bathroom in ADL suite and educated on safety in bathroom during transfers. Pt demonstrated good teach back and transferred to and from the tub transfer bench with CGA. Pt then participated in [employer] focused activities to increase functional use of the LUE. Pt completed bicep flexion and tricep extension exercises in gravity eliminated plane by rolling a ball towards/away from his body. Pt demonstrated increased bicep and tricep activation in gravity eliminated plane 2/5 and partial AROM against gravity. Trace shoulder activation noted during functional activities. Pt then completed finger flexion and  opposition activities to increase hand strength and functional grasp. Pt able to grasp wash cloth and blue foam block when provided with therapeutic motivation and maximal effort. Pt demonstrated ability to grasp foam block with L hand, but demonstrates challenges release block with finger extension. Pt then participated in mirror therapy activities with good success. Noted improvement with L hand grasp and release of wash cloth when utilizing mirror for visualization. Pt demonstrated ability to extend wrist x3 when instructed to complete movement with eyes closed and using visual imagery. Pt participated well in session and had increased motivation after seeing increased muscle activation in LUE. Pt was transported back to his room and left in his recliner with seat belt alarm on, call bell in reach, and all needs met. Rn notifed of Pt status.   Therapy Documentation Precautions:  Precautions Precautions: Fall Precaution Comments: L inattention, L hemiparesis, L lean, hx of seizures Restrictions Weight Bearing Restrictions: No General:   Vital Signs: Therapy Vitals Temp: 98.3 F (36.8 C) Temp Source: Oral Pulse Rate: 61 Resp: 18 BP: 107/65 Patient Position (if appropriate): Lying Oxygen Therapy SpO2: 100 % O2 Device: Room Air Pain:   ADL: ADL Eating: Minimal assistance Where Assessed-Eating: Bed level Grooming: Minimal assistance Where Assessed-Grooming: Sitting at sink Upper Body Bathing: Moderate assistance Where Assessed-Upper Body Bathing: Shower Lower Body Bathing: Minimal assistance Where Assessed-Lower Body Bathing: Shower Upper Body Dressing: Minimal assistance Where Assessed-Upper Body Dressing: Other (Comment) (Sitting on tub bench) Lower Body Dressing: Minimal assistance Where Assessed-Lower Body Dressing: Other (Comment) (Sitting on elevated toilet seat)  Toileting: Dependent Where Assessed-Toileting: Bed level Toilet Transfer: Minimal assistance Toilet Transfer  Method: Stand pivot Tub/Shower Transfer: Unable to assess Tub/Shower Transfer Method: Unable to assess Social research officer, government: Minimal assistance Social research officer, government Method: Ambulating (HHA) Youth worker: Shower seat without back   Therapy/Group: Individual Therapy  Janey Genta, OTR/L 12/15/2021, 3:01 PM

## 2021-12-15 NOTE — Progress Notes (Addendum)
PROGRESS NOTE   Subjective/Complaints: Appreciate neuropsych note Pt without c/o except left eye  tearing.  No pain or itching, no redness noted   Denies pain  Review of Systems  Constitutional:  Negative for chills and fever.  HENT:  Negative for congestion.   Eyes:  Negative for double vision.  Respiratory:  Negative for shortness of breath.   Cardiovascular:  Negative for chest pain.  Gastrointestinal:  Negative for abdominal pain, constipation, diarrhea, nausea and vomiting.  Musculoskeletal:  Negative for myalgias.  Neurological:  Positive for weakness. Negative for headaches.    Objective:   No results found. Recent Labs    12/13/21 0546  WBC 8.2  HGB 14.0  HCT 42.2  PLT 428*    Recent Labs    12/13/21 0546  NA 138  K 4.2  CL 103  CO2 26  GLUCOSE 90  BUN 8  CREATININE 1.01  CALCIUM 9.7     Intake/Output Summary (Last 24 hours) at 12/15/2021 0827 Last data filed at 12/14/2021 2100 Gross per 24 hour  Intake 600 ml  Output --  Net 600 ml         Physical Exam: Vital Signs Blood pressure 110/70, pulse 62, temperature 98.7 F (37.1 C), temperature source Oral, resp. rate 18, height 6' (1.829 m), weight 95.7 kg, SpO2 98 %.  HEENT left eye tearing , no injection no discharge, no periorbital swelling  General: No acute distress Mood and affect are appropriate Heart: Bradycardia Lungs: Clear to auscultation, breathing unlabored, no rales or wheezes Abdomen: Positive bowel sounds, soft nontender to palpation, nondistended Extremities: No clubbing, cyanosis, or edema Skin: No evidence of breakdown, no evidence of rash  Sensation intact LT LUE and LLE  Strength 5/5 RUE and RLE, 4 to 4+/5 LLE and 2- Elbow flexors and extensors, Left grip, 0/5 left finger extensors   Assessment/Plan: 1. Functional deficits which require 3+ hours per day of interdisciplinary therapy in a comprehensive inpatient  rehab setting. Physiatrist is providing close team supervision and 24 hour management of active medical problems listed below. Physiatrist and rehab team continue to assess barriers to discharge/monitor patient progress toward functional and medical goals  Care Tool:  Bathing    Body parts bathed by patient: Left arm, Chest, Abdomen, Front perineal area, Buttocks, Right upper leg, Left upper leg, Right lower leg, Left lower leg, Face   Body parts bathed by helper: Right arm     Bathing assist Assist Level: Minimal Assistance - Patient > 75%     Upper Body Dressing/Undressing Upper body dressing   What is the patient wearing?: Pull over shirt    Upper body assist Assist Level: Minimal Assistance - Patient > 75%    Lower Body Dressing/Undressing Lower body dressing      What is the patient wearing?: Pants, Incontinence brief     Lower body assist Assist for lower body dressing: Contact Guard/Touching assist     Toileting Toileting    Toileting assist Assist for toileting: Minimal Assistance - Patient > 75%     Transfers Chair/bed transfer  Transfers assist     Chair/bed transfer assist level: Contact Guard/Touching assist  Locomotion Ambulation   Ambulation assist      Assist level: Minimal Assistance - Patient > 75% Assistive device: No Device Max distance: 352f   Walk 10 feet activity   Assist     Assist level: Minimal Assistance - Patient > 75% Assistive device: No Device   Walk 50 feet activity   Assist Walk 50 feet with 2 turns activity did not occur: Safety/medical concerns  Assist level: Minimal Assistance - Patient > 75% Assistive device: No Device    Walk 150 feet activity   Assist Walk 150 feet activity did not occur: Safety/medical concerns  Assist level: Minimal Assistance - Patient > 75% Assistive device: No Device    Walk 10 feet on uneven surface  activity   Assist Walk 10 feet on uneven surfaces activity did  not occur: Safety/medical concerns   Assist level: Minimal Assistance - Patient > 75%     Wheelchair     Assist Is the patient using a wheelchair?: Yes (Pt transported to and from main therapy gym via TIS WC for time management.) Type of Wheelchair: Manual    Wheelchair assist level: Dependent - Patient 0% Max wheelchair distance: 150    Wheelchair 50 feet with 2 turns activity    Assist        Assist Level: Dependent - Patient 0%   Wheelchair 150 feet activity     Assist      Assist Level: Dependent - Patient 0%   Blood pressure 110/70, pulse 62, temperature 98.7 F (37.1 C), temperature source Oral, resp. rate 18, height 6' (1.829 m), weight 95.7 kg, SpO2 98 %.  Medical Problem List and Plan: 1. 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 Formulized individual treatment plan based on medical history, underlying problem and comorbidities.              - Monitor for development of LUE tone, may need WHO  -Continue CIR 2.  Antithrombotics: -DVT/anticoagulation:  Pharmaceutical: Lovenox may d/c due to amb 300'             -antiplatelet therapy: DAPT x 3 weeks (Plavix thru 10/25) followed by ASA alone.  3. Pain Management: Tylenol prn.  4. Mood/Behavior/Sleep: LCSW to follow for evaluation and support.              -antipsychotic agents: N/A 5. Neuropsych/cognition: This patient is not capable of making decisions on his own behalf. 6. Skin/Wound Care: routine pressure relief measures.              --continue supplements TID 7. Fluids/Electrolytes/Nutrition: Monitor I/O. Check lytes in am.             --hypoK resolved off KCL supplements       Latest Ref Rng & Units 12/13/2021    5:46 AM 12/06/2021    5:41 AM 12/03/2021    4:30 AM  BMP  Glucose 70 - 99 mg/dL 90  85  95   BUN 6 - 20 mg/dL 8  14  14    Creatinine 0.61 - 1.24 mg/dL 1.01  1.22  1.03   Sodium 135 - 145  mmol/L 138  134  133   Potassium 3.5 - 5.1 mmol/L 4.2  4.0  4.1   Chloride 98 - 111 mmol/L 103  98  98   CO2 22 - 32 mmol/L 26  23  24    Calcium 8.9 - 10.3 mg/dL 9.7  10.1  9.9     8. Seizures: Continue Keppra 1500 mg BID             -- GTC at home on presentation, cEEG 9/22-23 showed no epileptiform discharges.  9. ABLA: Resolved 10. HTN: Monitor BP TID--controlled off meds Vitals:   12/14/21 1933 12/15/21 0424  BP: 115/66 110/70  Pulse: (!) 57 62  Resp: 18 18  Temp: 98 F (36.7 C) 98.7 F (37.1 C)  SpO2: 98%    Hx HTN but BPs ok will cont to monitor  11. Hyperlipidemia: LDL 162--continue Crestor 40 mg/day 12. Leucocytosis: Downtrending, 15. Monitor trend. Fevers resolving.  -Current workup: CXR neg 9/30, UA negative, Lower extremity doppler negative for DVT  -WBC stable at 15.7 10/6  -Recheck Monday, no signs or symptoms of infection 13. Constipation refusing miralax most doses        -- LBM 10/15- per pt report 14. Bradycardia: continue to monitor HR TID- asymptomatic, reviewed EKG 11/19/21, essentially normal   LOS: 13 days A FACE TO [place] E Joel Cowin 12/15/2021, 8:27 AM

## 2021-12-15 NOTE — Progress Notes (Signed)
Patient ID: Frank Warner, male   DOB: 27-Sep-1980, 41 y.o.   MRN: [id number]  Team Conference Report to Patient/Family  Team Conference discussion was reviewed with the patient and caregiver, including goals, any changes in plan of care and target discharge date.  Patient and caregiver express understanding and are in agreement.  The patient has a target discharge date of 12/24/21.   SW met with patient and friend in the room to discuss team conference updates. Patient unsure who he would like to attend to family education. SW will discuss with patient mother, friend invited to attend. No additional  questions or concerns.  Dyanne Iha 12/15/2021, 1:47 PM

## 2021-12-16 LAB — GLUCOSE, CAPILLARY: Glucose-Capillary: 107 mg/dL — ABNORMAL HIGH (ref 70–99)

## 2021-12-16 NOTE — Progress Notes (Signed)
Occupational Therapy Session Note  Patient Details  Name: Frank Warner MRN: [id number] Date of Birth: 22-Feb-1981  Today's Date: 12/16/2021 OT Individual Time: 1005-1105 OT Individual Time Calculation (min): 60 min    Short Term Goals: Week 1:  OT Short Term Goal 1 (Week 1): Pt will increase UB dressing while seated on EOB or at sink with Mod A utilizing hemi dressing techniques. OT Short Term Goal 1 - Progress (Week 1): Met OT Short Term Goal 2 (Week 1): Pt will increase LB dressing while seated on EOB or at sink with Mod A utilizing hemi dressing techniques. OT Short Term Goal 2 - Progress (Week 1): Met OT Short Term Goal 3 (Week 1): Pt will increase dynamic sitting balance to SBA while completing self care tasks such as LB dressing and bathing. OT Short Term Goal 3 - Progress (Week 1): Met  Skilled Therapeutic Interventions/Progress Updates:     Pt received resting in bed willing to participate in therapy. Pt reported he is not having any pain and that he would like to shower during therapy today. Pt ambulated to shower with close supervision without an assistive device. Pt doffed his shirt while standing in bathroom holding onto grab bar for balance with supervision. Pt attempted to remove his socks and pants while standing and lost his balance, catching himself by holding the grab bar and CGA from therapist. Pt educated on bathroom safety and fall prevention during ADLs. Pt instructed to sit down on tub transfer bench to doff socks and pants and he doffed LB clothing with supervision. Pt completed shower while sitting on tub transfer bench with supervision following instruction on hemi-showering techniques. Pt ambulated back to his bed to dress while sitting EOB. Pt recalled hemi-dressing techniques for dressing and was provided additional education to solidify techniques. Pt donned his shirt and pants with min A. Pt applied deodorant with min A usin modified strategy. Pt donned R/L socks with  supervision using his R hand. During ADLs, Pt prefers to use only his RUE reporting "my left arm is tired". Pt then ambulated to the sink to wash his face and brush his teeth with supervision and min verbal cueing for safety. Pt is demonstrating return of functional use of his RUE, but requires max encouragement to utilize during ADLs/functional tasks. Pt demonstrates decreased awareness into his deficits and decreased safety awareness. Will continue to provide additional education to support carry over and increase safety. Pt was left in his room sitting in his wc with call bell in reach, seat belt alarm on, and all needs met. Pt requested a yogurt and was given one upon OT departure with nursing notified of Pt status.   Therapy Documentation Precautions:  Precautions Precautions: Fall Precaution Comments: L inattention, L hemiparesis, L lean, hx of seizures Restrictions Weight Bearing Restrictions: No    ADL: ADL Eating: Minimal assistance Where Assessed-Eating: Bed level Grooming: Minimal assistance Where Assessed-Grooming: Sitting at sink Upper Body Bathing: Moderate assistance Where Assessed-Upper Body Bathing: Shower Lower Body Bathing: Minimal assistance Where Assessed-Lower Body Bathing: Shower Upper Body Dressing: Minimal assistance Where Assessed-Upper Body Dressing: Other (Comment) (Sitting on tub bench) Lower Body Dressing: Minimal assistance Where Assessed-Lower Body Dressing: Other (Comment) (Sitting on elevated toilet seat) Toileting: Dependent Where Assessed-Toileting: Bed level Toilet Transfer: Minimal assistance Toilet Transfer Method: Stand pivot Tub/Shower Transfer: Unable to assess Tub/Shower Transfer Method: Unable to assess Social research officer, government: Minimal assistance Social research officer, government Method: Ambulating (HHA) Youth worker: Shower seat without back  Therapy/Group: Individual Therapy  Janey Genta 12/16/2021, 12:26 PM

## 2021-12-16 NOTE — Progress Notes (Signed)
PROGRESS NOTE   Subjective/Complaints:  Slept poorly but had no bowel or bladder issues at night, discussed urinary urgency Pt declines sleep aid Denies pain  Review of Systems  Constitutional:  Negative for chills and fever.  HENT:  Negative for congestion.   Eyes:  Negative for double vision.  Respiratory:  Negative for shortness of breath.   Cardiovascular:  Negative for chest pain.  Gastrointestinal:  Negative for abdominal pain, constipation, diarrhea, nausea and vomiting.  Musculoskeletal:  Negative for myalgias.  Neurological:  Positive for weakness. Negative for headaches.    Objective:   No results found. No results for input(s): "WBC", "HGB", "HCT", "PLT" in the last 72 hours.  No results for input(s): "NA", "K", "CL", "CO2", "GLUCOSE", "BUN", "CREATININE", "CALCIUM" in the last 72 hours.   Intake/Output Summary (Last 24 hours) at 12/16/2021 0828 Last data filed at 12/16/2021 0100 Gross per 24 hour  Intake 960 ml  Output 100 ml  Net 860 ml         Physical Exam: Vital Signs Blood pressure 106/65, pulse 61, temperature 98.1 F (36.7 C), temperature source Oral, resp. rate 16, height 6' (1.829 m), weight 95.7 kg, SpO2 98 %.   General: No acute distress Mood and affect are appropriate Heart: Regular rate and rhythm no rubs murmurs or extra sounds Lungs: Clear to auscultation, breathing unlabored, no rales or wheezes Abdomen: Positive bowel sounds, soft nontender to palpation, nondistended Extremities: No clubbing, cyanosis, or edema Skin: No evidence of breakdown, no evidence of rash  Sensation intact LT LUE and LLE  Strength 5/5 RUE and RLE, 4 to 4+/5 LLE and 2 Elbow flexors and extensors, Left grip, 0/5 left finger extensors   Assessment/Plan: 1. Functional deficits which require 3+ hours per day of interdisciplinary therapy in a comprehensive inpatient rehab setting. Physiatrist is providing  close team supervision and 24 hour management of active medical problems listed below. Physiatrist and rehab team continue to assess barriers to discharge/monitor patient progress toward functional and medical goals  Care Tool:  Bathing    Body parts bathed by patient: Left arm, Chest, Abdomen, Front perineal area, Buttocks, Right upper leg, Left upper leg, Right lower leg, Left lower leg, Face   Body parts bathed by helper: Right arm     Bathing assist Assist Level: Minimal Assistance - Patient > 75%     Upper Body Dressing/Undressing Upper body dressing   What is the patient wearing?: Pull over shirt    Upper body assist Assist Level: Minimal Assistance - Patient > 75%    Lower Body Dressing/Undressing Lower body dressing      What is the patient wearing?: Pants, Incontinence brief     Lower body assist Assist for lower body dressing: Contact Guard/Touching assist     Toileting Toileting    Toileting assist Assist for toileting: Minimal Assistance - Patient > 75%     Transfers Chair/bed transfer  Transfers assist     Chair/bed transfer assist level: Contact Guard/Touching assist     Locomotion Ambulation   Ambulation assist      Assist level: Minimal Assistance - Patient > 75% Assistive device: No Device Max distance: 382ft  Walk 10 feet activity   Assist     Assist level: Minimal Assistance - Patient > 75% Assistive device: No Device   Walk 50 feet activity   Assist Walk 50 feet with 2 turns activity did not occur: Safety/medical concerns  Assist level: Minimal Assistance - Patient > 75% Assistive device: No Device    Walk 150 feet activity   Assist Walk 150 feet activity did not occur: Safety/medical concerns  Assist level: Minimal Assistance - Patient > 75% Assistive device: No Device    Walk 10 feet on uneven surface  activity   Assist Walk 10 feet on uneven surfaces activity did not occur: Safety/medical  concerns   Assist level: Minimal Assistance - Patient > 75%     Wheelchair     Assist Is the patient using a wheelchair?: Yes (Pt transported to and from main therapy gym via TIS WC for time management.) Type of Wheelchair: Manual    Wheelchair assist level: Dependent - Patient 0% Max wheelchair distance: 150    Wheelchair 50 feet with 2 turns activity    Assist        Assist Level: Dependent - Patient 0%   Wheelchair 150 feet activity     Assist      Assist Level: Dependent - Patient 0%   Blood pressure 106/65, pulse 61, temperature 98.1 F (36.7 C), temperature source Oral, resp. rate 16, height 6' (1.829 m), weight 95.7 kg, SpO2 98 %.  Medical Problem List and Plan: 1. 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  Antithrombotics: -DVT/anticoagulation:  Pharmaceutical: Lovenox may d/c due to amb 300'             -antiplatelet therapy: DAPT x 3 weeks (Plavix thru 10/25) followed by ASA alone.  3. Pain Management: Tylenol prn.  4. Mood/Behavior/Sleep: LCSW to follow for evaluation and support.              -antipsychotic agents: N/A 5. Neuropsych/cognition: This patient is not capable of making decisions on his own behalf. 6. Skin/Wound Care: routine pressure relief measures.              --continue supplements TID 7. Fluids/Electrolytes/Nutrition: Monitor I/O. Check lytes in am.             --hypoK resolved off KCL supplements      Latest Ref Rng & Units 12/13/2021    5:46 AM 12/06/2021    5:41 AM 12/03/2021    4:30 AM  BMP  Glucose 70 - 99 mg/dL 90  85  95   BUN 6 - 20 mg/dL 8  14  14    Creatinine 0.61 - 1.24 mg/dL  9.15  0.56   Sodium 135 - 145 mmol/L 138  134  133   Potassium 3.5 - 5.1  mmol/L 4.2  4.0  4.1   Chloride 98 - 111 mmol/L 103  98  98   CO2 22 - 32 mmol/L 26  23  24    Calcium 8.9 - 10.3 mg/dL 9.7  9.79  9.9     8. Seizures:  Continue Keppra 1500 mg BID             -- GTC at home on presentation, cEEG 9/22-23 showed no epileptiform discharges.  9. ABLA: Resolved 10. HTN: Monitor BP TID--controlled off meds Vitals:   12/15/21 2008 12/15/21 2011  BP: 106/65 106/65  Pulse: 61 61  Resp: 16 16  Temp: 98.1 F (36.7 C) 98.1 F (36.7 C)  SpO2: 98%    Hx HTN but BPs ok will cont to monitor  11. Hyperlipidemia: LDL 162--continue Crestor 40 mg/day 12. Leucocytosis: Downtrending, 15. Monitor trend. Fevers resolving.  -Current workup: CXR neg 9/30, UA negative, Lower extremity doppler negative for DVT  -WBC stable at 15.7 10/6  -Recheck Monday, no signs or symptoms of infection 13. Constipation refusing miralax most doses        -- LBM 10/19 at 0100 14. Bradycardia: continue to monitor HR TID- asymptomatic, reviewed EKG 11/19/21, essentially normal  15.  Pt reports insomnia but declines sleep aid 16.  Urinary urgency no dysuria, likely CVA related  LOS: 14 days A FACE TO FACE EVALUATION WAS PERFORMED  Erick Colace 12/16/2021, 8:28 AM

## 2021-12-16 NOTE — Progress Notes (Signed)
Patient ID: Frank Warner, male   DOB: [birth year], 41 y.o.   MRN: [id number]  Sw spoke with patient mother, [name] to arrange family education. Patient mother currently preparing the home for the patient for discharge. Mother reports that she will attempt to come in by next week to participate.  Mother plans to go to [facility] to apply for additional resources for patient. Medical record phone number provided to patient's mother.   Mother shares that friend who is usually present may complete education with the team due to her assisting with care at discharge. No additional questions or concerns, Sw will follow up with mother on Monday.

## 2021-12-16 NOTE — Progress Notes (Signed)
Speech Language Pathology Weekly Progress and Session Note  Patient Details  Name: Frank Warner MRN: [id number] Date of Birth: 23-Jul-1980  Beginning of progress report period: December 09, 2021 End of progress report period: December 16, 2021  Today's Date: 12/16/2021 SLP Individual Time: 1215-1300 SLP Individual Time Calculation (min): 45 min  Short Term Goals: Week 2: SLP Short Term Goal 1 (Week 2): Patient will consume current diet with minimal overt s/s of aspiration with min A verbal cues for use of swallowing compensatory strategies. SLP Short Term Goal 1 - Progress (Week 2): Met SLP Short Term Goal 2 (Week 2): Patient will demonstrate sustained attention to a functional task for 6 minutes with Mod verbal cues for redirection. SLP Short Term Goal 2 - Progress (Week 2): Met SLP Short Term Goal 3 (Week 2): Patient will demonstrate functional problem solving for basic and familiar tasks with minto-mod A verbal cues. SLP Short Term Goal 3 - Progress (Week 2): Met SLP Short Term Goal 4 (Week 2): Patient will identify 2-3 areas of impairment and how they may impact ability to perform ADLs/iADLs to increase awareness of deficits with mod-to-maxA verbal cues SLP Short Term Goal 4 - Progress (Week 2): Met SLP Short Term Goal 5 (Week 2): Patient will recall new, daily information with min-to-mod A verbal and visual cues. SLP Short Term Goal 5 - Progress (Week 2): Met  New Short Term Goals: Week 3: SLP Short Term Goal 1 (Week 3): STG=LTG due to ELOS  Weekly Progress Updates: Patient has made functional gains and has met 5 of 5 STGs this reporting period. Patient is currently completing cognitive tasks with overall min-to-mod A cues to complete functional and mildly complex tasks accurately and safely. Pt continues to be limited by decreased motivation and decreased insight into deficits. Pt continues to progress with oral phase of swallowing and was progressed to regular textures and thin liquids  at supervision level for implementation of safe swallowing precautions and strategies. Patient and family education is ongoing. Continue to recommend ST intervention during CIR admission, as well as ST f/u upon d/c. Recommend 24/7 supervision and assistance at time of discharge.    Intensity: Minumum of 1-2 x/day, 30 to 90 minutes Frequency: 3 to 5 out of 7 days Duration/Length of Stay: 10/27 Treatment/Interventions: Cognitive remediation/compensation;Environmental controls;Internal/external aids;Speech/Language facilitation;Oral motor exercises;Cueing hierarchy;Therapeutic Exercise;Dysphagia/aspiration precaution training;Functional tasks;Patient/family education;Therapeutic Activities  Daily Session Skilled Therapeutic Interventions: Skilled ST treatment focused on swallowing and cognitive goals. Pt greeted upright in wheelchair on arrival and agreeable to regular texture trial tray. Pt consumed with effective mastication and was known to intentionally place bolus on R (stronger) side of oral cavity. Pt exhibited good oral clearance and without overt s/sx of aspiration. Pt was seen consuming meal with appropriate size bites and rate of consumption at with verbal cue x1 to monitor L anterior spillage. Pt did well with consistently checking lower lip for spillage following this verbal cue. SLP recommends diet advancement to regular textures and thin liquids at this time. Whole meds with liquid or in puree. Cont intermittent supervision. Pt verbalized understanding and agreement with plan. Pt recalled events from previous therapy sessions and discussions from yesterday with sup A. Pt was able to alternate attention between meal and functional discussion with SLP regarding therapy goals for 25 minute duration within a mildly distracting environment with only occasional verbal redirection cues. SLP observed improved motivation to participate this date. Patient was left in chair with alarm activated and  immediate needs within  reach at end of session. Continue per current plan of care.      General    Pain  None/denied  Therapy/Group: Individual Therapy  Patty Sermons 12/16/2021, 12:49 PM

## 2021-12-16 NOTE — Progress Notes (Signed)
Physical Therapy Weekly Progress Note  Patient Details  Name: Frank Warner MRN: [id number] Date of Birth: 19-Jul-1980  Beginning of progress report period: December 10, 2021 End of progress report period: December 16, 2021  Today's Date: 12/16/2021 PT Individual Time: 5697-9480 PT Individual Time Calculation (min): 62 min   Patient has met 5 of 5 short term goals.   Frank Warner is progressing well with therapy. He is currently at [facility] with bed mobility, SBA with sit to stand and bed to chair transfers, CGA with ambulation up to 250f using no AD, and CGA with stair negotiation. His impairments are left inattention, decreased standing dynamic balance, and gait impairments including occasional scissoring gait pattern, inattention to L environment when walking, decreased L foot clearance during swing phase. The pt would benefit from contined skilled CIR level therapy to address impairments. Planning to DC pt home with mother providing 24 hour support and [facility] area due to pt currently being uninsured.    Patient continues to demonstrate the following deficits muscle weakness, decreased cardiorespiratoy endurance, impaired timing and sequencing and unbalanced muscle activation,  , decreased attention to left, decreased safety awareness, and decreased sitting balance, decreased standing balance, decreased postural control, and decreased balance strategies and therefore will continue to benefit from skilled PT intervention to increase functional independence with mobility.  Patient progressing toward long term goals..  Continue plan of care.  PT Short Term Goals Week 2:  PT Short Term Goal 1 (Week 2): Pt will perform bed mobility with SPV consistently. PT Short Term Goal 1 - Progress (Week 2): Met PT Short Term Goal 2 (Week 2): Pt will perform sit <> stand transfers with SBA and LRAD consistenly. PT Short Term Goal 2 - Progress (Week 2): Met PT Short Term Goal 3  (Week 2): Pt will perform bed <> chair transfers with SBA and LRAD consistenly. PT Short Term Goal 3 - Progress (Week 2): Met PT Short Term Goal 4 (Week 2): Pt will amb 1535fwith CGA and LRAD. PT Short Term Goal 4 - Progress (Week 2): Met PT Short Term Goal 5 (Week 2): Pt will perform stair negotiaton (3 steps) with CGA and LRAD. PT Short Term Goal 5 - Progress (Week 2): Met Week 3:  PT Short Term Goal 1 (Week 3): = to LTGs base on ELOS  Skilled Therapeutic Interventions/Progress Updates:  Ambulation/gait training;Cognitive remediation/compensation;Discharge planning;DME/adaptive equipment instruction;Functional mobility training;Pain management;Psychosocial support;Splinting/orthotics;Therapeutic Activities;UE/LE Strength taining/ROM;Visual/perceptual remediation/compensation;Wheelchair propulsion/positioning;UE/LE Coordination activities;Therapeutic Exercise;Stair training;Skin care/wound management;Patient/family education;Neuromuscular re-education;Functional electrical stimulation;Disease management/prevention;Community reintegration;Balance/vestibular training   Pt greeted supine in bed asleep and easily awoken. Agreeable to therapy, but increased time and motivation required for pt to sit EOB and participate (more than last few sessions).   Supine to sit with min assist via R hand held for motivation.  Total assist to don shoes for time management.   Sit>stand from EOB with SBA and amb into bathroom with CGA. Pt able to stand and be continent of urine with CGA as well as amb out of bathroom and to sink and perform hand washing with CGA to stand, but min cues needed to increase attention to L hand when washing and drying. Manual facilitation of L hand while performing hand washing.   Gait training:  -Amb 30020f150 ft, and 200f32fth CGA and no AD. Seated rest breaks taken between sets.  Directions changes and speed changes performed throughout. Pt having mild instability and occasional  scissoring of steps,  but able to recover. Increased attention to L environment, but cues still needed on occasion.  - Scavenger hunt performed throughout gym to find 8 horseshoes with focus on improving environmental scanning, direction changes, and dynamic balance while reaching. CGA needed and pt showing similar impairments as stated above. Pt required occasional cuing to watch L side due to patient running into objects.   Stair training:  CGA with 4 steps (6 in) x 2 with R UE on B HR ascending and descending. Reciprocal gait pattern. Cues needed to increase attention to L arm and not let it hit hand railing.   Stand>sit SBA and sit>supine and scooting up to [facility] with SPV.   Pt left supine in bed with bed alarm on, HOB elevated, dinner tray set up, call bell in reach, and all needs met.    Therapy Documentation Precautions:  Precautions Precautions: Fall Precaution Comments: L inattention, L hemiparesis, L lean, hx of seizures Restrictions Weight Bearing Restrictions: No  Therapy/Group: Individual Therapy  Frank Warner, SPT  12/16/2021, 7:59 AM

## 2021-12-16 NOTE — Progress Notes (Signed)
Occupational Therapy Weekly Progress Note  Patient Details  Name: Frank Warner MRN: [id number] Date of Birth: 1980/12/18  Beginning of progress report period: December 10, 2021 End of progress report period: December 16, 2021  Today's Date: 12/16/2021    Patient has met 2 of 3 short term goals. Pt currently requires supervision for bathing from shower level, MIN A for UB dressing, and MIN A for LB dressing. Pt requires CGA for ambulatory ADL transfers with no AD. Pt continues to present with LUE hemiparesis but getting good return in LUE able to work on functional reaching from table top. DC plan remains appropriate with DC date of 10/27, family ed session pending.  Patient continues to demonstrate the following deficits: muscle weakness, unbalanced muscle activation, motor apraxia, decreased coordination, and decreased motor planning, peripheral, and decreased sitting balance, decreased standing balance, decreased postural control, hemiplegia, and decreased balance strategies and therefore will continue to benefit from skilled OT intervention to enhance overall performance with BADL, iADL, Vocation, and Reduce care partner burden.  Patient progressing toward long term goals..  Continue plan of care.  OT Short Term Goals Week 1:  OT Short Term Goal 1 (Week 1): Pt will increase UB dressing while seated on EOB or at sink with Mod A utilizing hemi dressing techniques. OT Short Term Goal 1 - Progress (Week 1): Met OT Short Term Goal 2 (Week 1): Pt will increase LB dressing while seated on EOB or at sink with Mod A utilizing hemi dressing techniques. OT Short Term Goal 2 - Progress (Week 1): Met OT Short Term Goal 3 (Week 1): Pt will increase dynamic sitting balance to SBA while completing self care tasks such as LB dressing and bathing. OT Short Term Goal 3 - Progress (Week 1): Met Week 2:  OT Short Term Goal 1 (Week 2): pt will complete 1/3 parts of toileting with MINA OT Short Term Goal 1 -  Progress (Week 2): Met OT Short Term Goal 2 (Week 2): pt will demonstrate emergent awareness during functional mobility tasks OT Short Term Goal 2 - Progress (Week 2): Progressing toward goal OT Short Term Goal 3 (Week 2): pt will complete sit>stands with CGA with LRAD OT Short Term Goal 3 - Progress (Week 2): Met Week 3:  OT Short Term Goal 1 (Week 3): LTG= STG   Therapy Documentation Precautions:  Precautions Precautions: Fall Precaution Comments: L inattention, L hemiparesis, L lean, hx of seizures Restrictions Weight Bearing Restrictions: No    Therapy/Group: Individual Therapy  Corinne Ports [facility] 12/16/2021, 10:20 AM

## 2021-12-16 NOTE — Progress Notes (Signed)
Occupational Therapy Session Note  Patient Details  Name: Frank Warner MRN: [id number] Date of Birth: 04-25-1980  Today's Date: 12/16/2021 OT Individual Time: 9381-0175 OT Individual Time Calculation (min): 60 min    Short Term Goals: Week 3:  OT Short Term Goal 1 (Week 3): LTG= STG  Skilled Therapeutic Interventions/Progress Updates:  Pt greeted seated in w/c, pt agreeable to OT intervention. Total A transport to gym where pt completed ambulatory transfer to [facility] with close supervision and no AD with pt  completing sitting>supine to mat table with supervision. Worked on LUE from gravity eliminated plane with pt presenting with great return in L biceps able to complete elbow flexion/extension from supine with no support at elbow. Also worked on elbow flexion/extension from supine with great return with pt instructed to work on [employer]" education provided on how all therex could be adapted for home. Worked LUE in sidelying with pt completing scapular protraction/retraction in sidelying with 2-/5, places LUE on a ball to provide increased AAROM. Pt also able to lay in supine with unweighted dowel secured to LUE to work on chest presses and elbow flexion/extension as well as shoulder circumduction.  Assisted pt to sitting on EOM with pt completing hand over hand target reach to horseshoes with pt needing assist for shoulder flexion but able to grasp horseshoes and bend elbow during functional reach.  Pt transported back to room with total A with pt returned to supine with all needs within reach and bed alarm activated.   Therapy Documentation Precautions:  Precautions Precautions: Fall Precaution Comments: L inattention, L hemiparesis, L lean, hx of seizures Restrictions Weight Bearing Restrictions: No  Pain: No pain    Therapy/Group: Individual Therapy  Corinne Ports [facility] 12/16/2021, 4:05 PM

## 2021-12-17 NOTE — Progress Notes (Signed)
Physical Therapy Session Note  Patient Details  Name: Frank Warner MRN: [id number] Date of Birth: Feb 07, 1981  Today's Date: 12/17/2021 PT Individual Time: 1417-1530 PT Individual Time Calculation (min): 73 min   Short Term Goals: Week 2:  PT Short Term Goal 1 (Week 2): Pt will perform bed mobility with SPV consistently. PT Short Term Goal 1 - Progress (Week 2): Met PT Short Term Goal 2 (Week 2): Pt will perform sit <> stand transfers with SBA and LRAD consistenly. PT Short Term Goal 2 - Progress (Week 2): Met PT Short Term Goal 3 (Week 2): Pt will perform bed <> chair transfers with SBA and LRAD consistenly. PT Short Term Goal 3 - Progress (Week 2): Met PT Short Term Goal 4 (Week 2): Pt will amb 142f with CGA and LRAD. PT Short Term Goal 4 - Progress (Week 2): Met PT Short Term Goal 5 (Week 2): Pt will perform stair negotiaton (3 steps) with CGA and LRAD. PT Short Term Goal 5 - Progress (Week 2): Met  Skilled Therapeutic Interventions/Progress Updates: Pt presents asleep sidelying on right w/ L UE behind body.  Pt aware of positioning when asked, but does not attempt re-positioning unless cued.  Pt transfers sup to sit w/ supervision and requires A to don shoes.  Pt transfers sit to stand and amb to BR w/ CGA, w/ cues needed for safe negotiation in room for objects to left.  Pt continent of bladder in toilet w/ CGA only for guarding when standing, NT to chart.  Pt stood at sink to wash hands w/ cueing for use of paper towels for dry hands for use.  Pt wheeled self to main gym w/ B feet and R hand, cues for inattention to left, usually verbal, but 2 episodes of PT needing to assist to avoid collision.  Pt performed multiple trials of gait w/o AD but CGA and cueing for inattention to Left, x 2 rsulting in scissoring and increased assist from PT for safety.  Pt w/ noted toe drag w/o verbal cues.  Pt performed toe taps to large cone w/ verbal cues for glut activation on L to avoid large side lean to  left w/ stance.  Pt performed 3 x 5 sit to stand w/o UE assist and RLE on 3 3/4" platform.  Pt amb up ramp and across mulch w/ 1st trial stepping off mulch step w/ trip of L foot.  Pt performed 2nd trial w/ verbal cues and improved safety.  Pt performed scavenger hunt in small gym looking for brightly colored squidges.  When pt told to look to left for items, improved attention, but w/o, pt tends to crowd or collide w/ items on left w/o cues.  Pt returned to room and transfers sit to supine w/ supervision.  Bed alarm on and all needs in reach.  Pt requested apple sauce and approved by nursing.     Therapy Documentation Precautions:  Precautions Precautions: Fall Precaution Comments: L inattention, L hemiparesis, L lean, hx of seizures Restrictions Weight Bearing Restrictions: No General:   Vital Signs: Therapy Vitals Temp: 98.3 F (36.8 C) Temp Source: Oral Pulse Rate: (!) 57 Resp: 16 BP: 112/75 Patient Position (if appropriate): Lying Oxygen Therapy SpO2: 97 % O2 Device: Room Air Pain:0/10 Pain Assessment Pain Scale: 0-10 Pain Score: 0-No pain Mobility:   :      Therapy/Group: Individual Therapy  JLadoris Gene10/20/2023, 4:10 PM

## 2021-12-17 NOTE — Progress Notes (Signed)
Occupational Therapy Session Note  Patient Details  Name: Frank Warner MRN: [id number] Date of Birth: 05/11/1980  Today's Date: 12/17/2021 OT Individual Time: 6153-7943 OT Individual Time Calculation (min): 69 min    Short Term Goals: Week 3:  OT Short Term Goal 1 (Week 3): LTG= STG  Skilled Therapeutic Interventions/Progress Updates:    Upon OT arrival, pt semi recumbent in bed reporting no pain and is agreeable to OT treatment. Pt requesting to get washed up. Pt completes supine to sit transfer with SBA and stand pivot transfer into wheelchair with CGA. Pt was transported into bathroom via w/c and total A and completes stand pivot transfer onto tub transfer bench with CGA. Pt performs full shower ADL at the levels below. Pt requires min verbal cues for safety awareness (sitting versus standing to doff pants and undergarments), hemi techniques, and to utilize L UE during functional tasks to promote return. Discussed weight bearing technique with pt as he did not understand how his leg function returned quicker than his arm. Pt able to ambulate with HHA with Min A secondary to one LOB to the left and also loses his balance seated EOB to the left but was able to correct himself. Pt able to stand with SBA while managing pants. Pt was left seated in w/c at end of session with all needs met.   Therapy Documentation Precautions:  Precautions Precautions: Fall Precaution Comments: L inattention, L hemiparesis, L lean, hx of seizures Restrictions Weight Bearing Restrictions: No    ADL: ADL Eating: Supervision/safety Where Assessed-Eating: Bed level Grooming: Supervision/safety Where Assessed-Grooming: Sitting at sink Upper Body Bathing: Minimal assistance (to wash R UE) Where Assessed-Upper Body Bathing: Shower Lower Body Bathing: Contact guard Where Assessed-Lower Body Bathing: Shower Upper Body Dressing: Supervision/safety Where Assessed-Upper Body Dressing: Chair Lower Body Dressing:  Minimal assistance (to tie shoes and pull up pants over L hip) Where Assessed-Lower Body Dressing: Teaching laboratory technician: Curator Method: Radiographer, therapeutic: Shower seat without back    Therapy/Group: Individual Therapy  Marvetta Gibbons 12/17/2021, 10:42 AM

## 2021-12-17 NOTE — Progress Notes (Signed)
PROGRESS NOTE   Subjective/Complaints:  Has had a couple incont episodes in therapy due to urgency but generally has been cont of bowel and bladder  Usually drowsy in ams, but slept better per pt report  Denies pain  Review of Systems  Constitutional:  Negative for chills and fever.  HENT:  Negative for congestion.   Eyes:  Negative for double vision.  Respiratory:  Negative for shortness of breath.   Cardiovascular:  Negative for chest pain.  Gastrointestinal:  Negative for abdominal pain, constipation, diarrhea, nausea and vomiting.  Musculoskeletal:  Negative for myalgias.  Neurological:  Positive for weakness. Negative for headaches.    Objective:   No results found. No results for input(s): "WBC", "HGB", "HCT", "PLT" in the last 72 hours.  No results for input(s): "NA", "K", "CL", "CO2", "GLUCOSE", "BUN", "CREATININE", "CALCIUM" in the last 72 hours.   Intake/Output Summary (Last 24 hours) at 12/17/2021 0824 Last data filed at 12/16/2021 1816 Gross per 24 hour  Intake 360 ml  Output --  Net 360 ml         Physical Exam: Vital Signs Blood pressure 104/62, pulse (!) 53, temperature 98 F (36.7 C), resp. rate 18, height 6' (1.829 m), weight 95.7 kg, SpO2 98 %.   General: No acute distress Mood and affect are appropriate Heart: Regular rate and rhythm no rubs murmurs or extra sounds Lungs: Clear to auscultation, breathing unlabored, no rales or wheezes Abdomen: Positive bowel sounds, soft nontender to palpation, nondistended Extremities: No clubbing, cyanosis, or edema Skin: No evidence of breakdown, no evidence of rash  Sensation intact LT LUE and LLE  Strength 5/5 RUE and RLE, 4 to 4+/5 LLE and 3- Elbow flexors and extensors,trace Left grip, 0/5 left finger extensors   Assessment/Plan: 1. Functional deficits which require 3+ hours per day of interdisciplinary therapy in a comprehensive inpatient rehab  setting. Physiatrist is providing close team supervision and 24 hour management of active medical problems listed below. Physiatrist and rehab team continue to assess barriers to discharge/monitor patient progress toward functional and medical goals  Care Tool:  Bathing    Body parts bathed by patient: Left arm, Chest, Abdomen, Front perineal area, Buttocks, Right upper leg, Left upper leg, Right lower leg, Left lower leg, Face   Body parts bathed by helper: Right arm     Bathing assist Assist Level: Minimal Assistance - Patient > 75%     Upper Body Dressing/Undressing Upper body dressing   What is the patient wearing?: Pull over shirt    Upper body assist Assist Level: Minimal Assistance - Patient > 75%    Lower Body Dressing/Undressing Lower body dressing      What is the patient wearing?: Pants, Incontinence brief     Lower body assist Assist for lower body dressing: Contact Guard/Touching assist     Toileting Toileting    Toileting assist Assist for toileting: Minimal Assistance - Patient > 75%     Transfers Chair/bed transfer  Transfers assist     Chair/bed transfer assist level: Supervision/Verbal cueing     Locomotion Ambulation   Ambulation assist      Assist level: Contact Guard/Touching assist Assistive  device: No Device Max distance: 359ft   Walk 10 feet activity   Assist     Assist level: Contact Guard/Touching assist Assistive device: No Device   Walk 50 feet activity   Assist Walk 50 feet with 2 turns activity did not occur: Safety/medical concerns  Assist level: Contact Guard/Touching assist Assistive device: No Device    Walk 150 feet activity   Assist Walk 150 feet activity did not occur: Safety/medical concerns  Assist level: Contact Guard/Touching assist Assistive device: No Device    Walk 10 feet on uneven surface  activity   Assist Walk 10 feet on uneven surfaces activity did not occur: Safety/medical  concerns   Assist level: Contact Guard/Touching assist     Wheelchair     Assist Is the patient using a wheelchair?: Yes (Pt transported to and from main therapy gym via TIS WC for time management.) Type of Wheelchair: Manual    Wheelchair assist level: Dependent - Patient 0% Max wheelchair distance: 150    Wheelchair 50 feet with 2 turns activity    Assist        Assist Level: Dependent - Patient 0%   Wheelchair 150 feet activity     Assist      Assist Level: Dependent - Patient 0%   Blood pressure 104/62, pulse (!) 53, temperature 98 F (36.7 C), resp. rate 18, height 6' (1.829 m), weight 95.7 kg, SpO2 98 %.  Medical Problem List and Plan: 1. 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  Antithrombotics: -DVT/anticoagulation:  Pharmaceutical: Lovenox may d/c due to amb 300'             -antiplatelet therapy: DAPT x 3 weeks (Plavix thru 10/25) followed by ASA alone.  3. Pain Management: Tylenol prn.  4. Mood/Behavior/Sleep: LCSW to follow for evaluation and support.              -antipsychotic agents: N/A 5. Neuropsych/cognition: This patient is not capable of making decisions on his own behalf. 6. Skin/Wound Care: routine pressure relief measures.              --continue supplements TID 7. Fluids/Electrolytes/Nutrition: Monitor I/O. Check lytes in am.             --hypoK resolved off KCL supplements      Latest Ref Rng & Units 12/13/2021    5:46 AM 12/06/2021    5:41 AM 12/03/2021    4:30 AM  BMP  Glucose 70 - 99 mg/dL 90  85  95   BUN 6 - 20 mg/dL 8  14  14    Creatinine 0.61 - 1.24 mg/dL 1.01  1.22  1.03   Sodium 135 - 145 mmol/L 138  134  133   Potassium 3.5 - 5.1 mmol/L 4.2  4.0  4.1    Chloride 98 - 111 mmol/L 103  98  98   CO2 22 - 32 mmol/L 26  23  24    Calcium 8.9 - 10.3 mg/dL 9.7  10.1  9.9     8. Seizures: Continue Keppra 1500 mg BID             --  GTC at home on presentation, cEEG 9/22-23 showed no epileptiform discharges.  9. ABLA: Resolved 10. HTN: Monitor BP TID--controlled off meds Vitals:   12/16/21 1947 12/17/21 0333  BP: 124/82 104/62  Pulse: (!) 55 (!) 53  Resp: 18 18  Temp: 98.3 F (36.8 C) 98 F (36.7 C)  SpO2: 100% 98%   Hx HTN but BPs ok will cont to monitor  11. Hyperlipidemia: LDL 162--continue Crestor 40 mg/day 12. Leucocytosis: Downtrending, 15. Monitor trend. Fevers resolving.  -Current workup: CXR neg 9/30, UA negative, Lower extremity doppler negative for DVT  -WBC stable at 15.7 10/6  -Recheck Monday, no signs or symptoms of infection 13. Constipation refusing miralax most doses        -- LBM 10/19 at 0100 14. Bradycardia: continue to monitor HR TID- asymptomatic, reviewed EKG 11/19/21, essentially normal  15.  Pt reports insomnia but declines sleep aid 16.  Urinary urgency no dysuria, likely CVA related  LOS: 15 days A FACE TO FACE EVALUATION WAS PERFORMED  Charlett Blake 12/17/2021, 8:24 AM

## 2021-12-17 NOTE — Progress Notes (Signed)
Occupational Therapy Session Note  Patient Details  Name: Frank Warner MRN: [id number] Date of Birth: April 01, 1980  Today's Date: 12/17/2021 OT Individual Time: 1115-1200 OT Individual Time Calculation (min): 45 min    Short Term Goals: Week 1:  OT Short Term Goal 1 (Week 1): Pt will increase UB dressing while seated on EOB or at sink with Mod A utilizing hemi dressing techniques. OT Short Term Goal 1 - Progress (Week 1): Met OT Short Term Goal 2 (Week 1): Pt will increase LB dressing while seated on EOB or at sink with Mod A utilizing hemi dressing techniques. OT Short Term Goal 2 - Progress (Week 1): Met OT Short Term Goal 3 (Week 1): Pt will increase dynamic sitting balance to SBA while completing self care tasks such as LB dressing and bathing. OT Short Term Goal 3 - Progress (Week 1): Met  Skilled Therapeutic Interventions/Progress Updates:     Pt received in room resting in wc willing to participate in therapy. Pt reporting no pain during session. Pt transported to therapy gym in wc. Pt participated on NMR-education focused session to improve functional use of Pt. LUE. Pt transferred sit<>supine close supervision. Pt completed LUE exercises in gravity eliminated plane while supine on mat. Pt demonstrating good return of bicep and triceps moving through full AAROM in gravity eliminated plane and able to resist minimal resistance. Therapy ball utilized under Pt's Lue to stabilized arm and increase ROM. Pt retraction and protraction of shoulder gradually returning with activation in gravity eliminated plane 2-/5. Pt demonstrated teach back of  home exercises taught in previous session to support carry over. Pt supine>sit to work on functional reach sitting at table EOM. HOH targeted reach to grasp a cone placed at Pt's midline. Assist required to facilitate shoulder flexion and increased time required to grasp cone with finger flexion. Pt transported back to room in wc and left sitting in wc with  call bell in reach, posey alarm on, and all needs met.   Therapy Documentation Precautions:  Precautions Precautions: Fall Precaution Comments: L inattention, L hemiparesis, L lean, hx of seizures Restrictions Weight Bearing Restrictions: No   Vital Signs: Therapy Vitals Temp: 98.3 F (36.8 C) Temp Source: Oral Pulse Rate: (Abnormal) 57 Resp: 16 BP: 112/75 Patient Position (if appropriate): Lying Oxygen Therapy SpO2: 97 % O2 Device: Room Air   ADL: ADL Eating: Supervision/safety Where Assessed-Eating: Bed level Grooming: Supervision/safety Where Assessed-Grooming: Sitting at sink Upper Body Bathing: Minimal assistance (to wash R UE) Where Assessed-Upper Body Bathing: Shower Lower Body Bathing: Contact guard Where Assessed-Lower Body Bathing: Shower Upper Body Dressing: Supervision/safety Where Assessed-Upper Body Dressing: Chair Lower Body Dressing: Minimal assistance (to tie shoes) Where Assessed-Lower Body Dressing: Wheelchair Toileting: Unable to assess (did not need to go) Where Assessed-Toileting: Bed level Toilet Transfer: Minimal assistance Toilet Transfer Method: Stand pivot Tub/Shower Transfer: Unable to assess Tub/Shower Transfer Method: Unable to assess Social research officer, government: Curator Method: Radiographer, therapeutic: Shower seat without back    Therapy/Group: Individual Therapy  Janey Genta 12/17/2021, 3:26 PM

## 2021-12-17 NOTE — Plan of Care (Signed)
  Problem: Consults Goal: RH STROKE PATIENT EDUCATION Description: See Patient Education module for education specifics  Outcome: Progressing   Problem: RH BOWEL ELIMINATION Goal: RH STG MANAGE BOWEL WITH ASSISTANCE Description: STG Manage Bowel with mod I Assistance. Outcome: Progressing Goal: RH STG MANAGE BOWEL W/MEDICATION W/ASSISTANCE Description: STG Manage Bowel with Medication with mod I Assistance. Outcome: Progressing   Problem: RH SAFETY Goal: RH STG ADHERE TO SAFETY PRECAUTIONS W/ASSISTANCE/DEVICE Description: STG Adhere to Safety Precautions With cues Assistance/Device. Outcome: Progressing   Problem: RH COGNITION-NURSING Goal: RH STG USES MEMORY AIDS/STRATEGIES W/ASSIST TO PROBLEM SOLVE Description: STG Uses Memory Aids/Strategies With cues Assistance to Problem Solve. Outcome: Progressing   Problem: RH PAIN MANAGEMENT Goal: RH STG PAIN MANAGED AT OR BELOW PT'S PAIN GOAL Description: < 4 with prns Outcome: Progressing   Problem: RH KNOWLEDGE DEFICIT Goal: RH STG INCREASE KNOWLEDGE OF HYPERTENSION Description: Patient and mother will be able to HTN, manage care with medications and dietary modifications at discharge using handouts and educational resources independently Outcome: Progressing Goal: RH STG INCREASE KNOWLEGDE OF HYPERLIPIDEMIA Description: Patient and mother will be able to HLD, manage care with medications and dietary modifications at discharge using handouts and educational resources independently Outcome: Progressing Goal: RH STG INCREASE KNOWLEDGE OF STROKE PROPHYLAXIS Description: Patient and mother will be able to secondary risks with medications and dietary modifications at discharge using handouts and educational resources independently Outcome: Progressing   Problem: Activity: Goal: Ability to tolerate increased activity will improve Outcome: Progressing   Problem: Respiratory: Goal: Ability to maintain a clear airway and adequate  ventilation will improve Outcome: Progressing   Problem: Role Relationship: Goal: Method of communication will improve Outcome: Progressing   Problem: Education: Goal: Understanding of CV disease, CV risk reduction, and recovery process will improve Outcome: Progressing Goal: Individualized Educational Video(s) Outcome: Progressing   Problem: Activity: Goal: Ability to return to baseline activity level will improve Outcome: Progressing

## 2021-12-18 NOTE — Plan of Care (Signed)
  Problem: Consults Goal: RH STROKE PATIENT EDUCATION Description: See Patient Education module for education specifics  Outcome: Progressing   Problem: RH BOWEL ELIMINATION Goal: RH STG MANAGE BOWEL WITH ASSISTANCE Description: STG Manage Bowel with mod I Assistance. Outcome: Progressing Goal: RH STG MANAGE BOWEL W/MEDICATION W/ASSISTANCE Description: STG Manage Bowel with Medication with mod I Assistance. Outcome: Progressing   Problem: RH SAFETY Goal: RH STG ADHERE TO SAFETY PRECAUTIONS W/ASSISTANCE/DEVICE Description: STG Adhere to Safety Precautions With cues Assistance/Device. Outcome: Progressing   Problem: RH COGNITION-NURSING Goal: RH STG USES MEMORY AIDS/STRATEGIES W/ASSIST TO PROBLEM SOLVE Description: STG Uses Memory Aids/Strategies With cues Assistance to Problem Solve. Outcome: Progressing   Problem: RH PAIN MANAGEMENT Goal: RH STG PAIN MANAGED AT OR BELOW PT'S PAIN GOAL Description: < 4 with prns Outcome: Progressing   Problem: RH KNOWLEDGE DEFICIT Goal: RH STG INCREASE KNOWLEDGE OF HYPERTENSION Description: Patient and mother will be able to HTN, manage care with medications and dietary modifications at discharge using handouts and educational resources independently Outcome: Progressing Goal: RH STG INCREASE KNOWLEGDE OF HYPERLIPIDEMIA Description: Patient and mother will be able to HLD, manage care with medications and dietary modifications at discharge using handouts and educational resources independently Outcome: Progressing Goal: RH STG INCREASE KNOWLEDGE OF STROKE PROPHYLAXIS Description: Patient and mother will be able to secondary risks with medications and dietary modifications at discharge using handouts and educational resources independently Outcome: Progressing   Problem: Activity: Goal: Ability to tolerate increased activity will improve Outcome: Progressing   Problem: Respiratory: Goal: Ability to maintain a clear airway and adequate  ventilation will improve Outcome: Progressing   Problem: Role Relationship: Goal: Method of communication will improve Outcome: Progressing   Problem: Education: Goal: Understanding of CV disease, CV risk reduction, and recovery process will improve Outcome: Progressing Goal: Individualized Educational Video(s) Outcome: Progressing   Problem: Activity: Goal: Ability to return to baseline activity level will improve Outcome: Progressing   

## 2021-12-18 NOTE — Progress Notes (Signed)
Occupational Therapy Session Note  Patient Details  Name: Frank Warner MRN: [id number] Date of Birth: [birth year]  Today's Date: 12/18/2021 OT Individual Time: 1010-1100 OT Individual Time Calculation (min): 50 min    Short Term Goals: Week 2:  OT Short Term Goal 1 (Week 3): LTG= STG  Skilled Therapeutic Interventions/Progress Updates:    Patient agreeable to participate in OT session. Reports 0/10 pain level.   Patient participated in skilled OT session focusing on ADL re-training. Therapist facilitated session to allow patient to complete bathing and dressing tasks as independently and safely as possible. Pt completed all bed mobility with Mod I. Pt completed functional mobility from bed to walk in shower with close SBA and no device. No LOB noted. Pt provided with close SBA throughout session due to impulsiveness (standing without letting therapist know) and decreased awareness related to safety and judgement.  Pt provided with SBA during LB bathing and Min A for UB bathing in shower (due to left side hemiparesis). Dressing completed seated at wheelchair level. Set-up provided for UB dressing with hemi dressing techniques due to shirt becoming twisted. LB dressing completed with Min A utilizing hemi dressing techniques.     Therapy Documentation Precautions:  Precautions Precautions: Fall Precaution Comments: L inattention, L hemiparesis, L lean, hx of seizures Restrictions Weight Bearing Restrictions: No  Therapy/Group: Individual Therapy  Ailene Ravel, OTR/L,CBIS  Supplemental OT - [facility]  12/18/2021, 8:02 AM

## 2021-12-18 NOTE — Progress Notes (Signed)
PROGRESS NOTE   Subjective/Complaints:  Pt had incontinence of bladder when attempted to see pt this Am When went back, was cleaned up in bed- said wasn't clear was going to have accident. Otherwise, reports he's "fine"-  Coughing  "a little"- esp after eats/drinks. But says it's "fine".  No BM since 10/19 per pt- 10/15 per nursing notes- pt reports "no one was here when had BM".     Objective:   No results found. No results for input(s): "WBC", "HGB", "HCT", "PLT" in the last 72 hours.  No results for input(s): "NA", "K", "CL", "CO2", "GLUCOSE", "BUN", "CREATININE", "CALCIUM" in the last 72 hours.   Intake/Output Summary (Last 24 hours) at 12/18/2021 1027 Last data filed at 12/18/2021 0300 Gross per 24 hour  Intake 360 ml  Output 1000 ml  Net -640 ml         Physical Exam: Vital Signs Blood pressure 108/74, pulse (!) 52, temperature 98.4 F (36.9 C), temperature source Oral, resp. rate 16, height 6' (1.829 m), weight 95.7 kg, SpO2 100 %.    General: awake, alert, calm- just got cleaned up- sitting up in bed;  NAD HENT: conjugate gaze; oropharynx moist CV: bradycardic rate; no JVD Pulmonary: CTA B/L; no W/R/R- good air movement GI: soft, NT, ND but a little protuberant, (+)BS Psychiatric: appropriate, but very flat; almost masked facies Neurological: alert, but sleepy Sensation intact LT LUE and LLE  Strength 5/5 RUE and RLE, 4 to 4+/5 LLE and 3- Elbow flexors and extensors,trace Left grip, 0/5 left finger extensors   Assessment/Plan: 1. Functional deficits which require 3+ hours per day of interdisciplinary therapy in a comprehensive inpatient rehab setting. Physiatrist is providing close team supervision and 24 hour management of active medical problems listed below. Physiatrist and rehab team continue to assess barriers to discharge/monitor patient progress toward functional and medical goals  Care  Tool:  Bathing    Body parts bathed by patient: Left arm, Chest, Abdomen, Front perineal area, Buttocks, Right upper leg, Left upper leg, Right lower leg, Left lower leg, Face   Body parts bathed by helper: Right arm     Bathing assist Assist Level: Minimal Assistance - Patient > 75%     Upper Body Dressing/Undressing Upper body dressing   What is the patient wearing?: Pull over shirt    Upper body assist Assist Level: Supervision/Verbal cueing    Lower Body Dressing/Undressing Lower body dressing      What is the patient wearing?: Pants, Incontinence brief     Lower body assist Assist for lower body dressing: Contact Guard/Touching assist     Toileting Toileting    Toileting assist Assist for toileting: Contact Guard/Touching assist     Transfers Chair/bed transfer  Transfers assist     Chair/bed transfer assist level: Contact Guard/Touching assist     Locomotion Ambulation   Ambulation assist      Assist level: Contact Guard/Touching assist Assistive device: No Device Max distance: 200   Walk 10 feet activity   Assist     Assist level: Contact Guard/Touching assist Assistive device: No Device   Walk 50 feet activity   Assist Walk 50 feet with  2 turns activity did not occur: Safety/medical concerns  Assist level: Contact Guard/Touching assist Assistive device: No Device    Walk 150 feet activity   Assist Walk 150 feet activity did not occur: Safety/medical concerns  Assist level: Contact Guard/Touching assist Assistive device: No Device    Walk 10 feet on uneven surface  activity   Assist Walk 10 feet on uneven surfaces activity did not occur: Safety/medical concerns   Assist level: Contact Guard/Touching assist Assistive device: Other (comment) (no device.)   Wheelchair     Assist Is the patient using a wheelchair?: Yes Type of Wheelchair: Manual    Wheelchair assist level: Supervision/Verbal cueing Max wheelchair  distance: 150    Wheelchair 50 feet with 2 turns activity    Assist        Assist Level: Supervision/Verbal cueing   Wheelchair 150 feet activity     Assist      Assist Level: Supervision/Verbal cueing   Blood pressure 108/74, pulse (!) 52, temperature 98.4 F (36.9 C), temperature source Oral, resp. rate 16, height 6' (1.829 m), weight 95.7 kg, SpO2 100 %.  Medical Problem List and Plan: 1. 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  Antithrombotics: -DVT/anticoagulation:  Pharmaceutical: Lovenox may d/c due to amb 300'             -antiplatelet therapy: DAPT x 3 weeks (Plavix thru 10/25) followed by ASA alone.  3. Pain Management: Tylenol prn.  4. Mood/Behavior/Sleep: LCSW to follow for evaluation and support.              -antipsychotic agents: N/A 5. Neuropsych/cognition: This patient is not capable of making decisions on his own behalf. 6. Skin/Wound Care: routine pressure relief measures.              --continue supplements TID 7. Fluids/Electrolytes/Nutrition: Monitor I/O. Check lytes in am.             --hypoK resolved off KCL supplements      Latest Ref Rng & Units 12/13/2021    5:46 AM 12/06/2021    5:41 AM 12/03/2021    4:30 AM  BMP  Glucose 70 - 99 mg/dL 90  85  95   BUN 6 - 20 mg/dL 8  14  14    Creatinine 0.61 - 1.24 mg/dL  4.09  8.11   Sodium 135 - 145 mmol/L 138  134  133   Potassium 3.5 - 5.1 mmol/L 4.2  4.0  4.1   Chloride 98 - 111 mmol/L 103  98  98   CO2 22 - 32 mmol/L 26  23  24    Calcium 8.9 - 10.3 mg/dL 9.7  9.14  9.9     8. Seizures: Continue Keppra 1500 mg BID             -- GTC at home on presentation, cEEG 9/22-23 showed no epileptiform discharges.  9. ABLA:  Resolved 10. HTN: Monitor BP TID--controlled off meds Vitals:   12/18/21 0443 12/18/21 0450  BP: 108/74   Pulse: (!) 46 (!)  52  Resp: 16   Temp: 98.4 F (36.9 C)   SpO2: 100%    Hx HTN but BPs ok will cont to monitor   10/21- HR running 40s-50s- off meds- likely his baseline- will monitor- no Sx's.  11. Hyperlipidemia: LDL 162--continue Crestor 40 mg/day 12. Leucocytosis: Downtrending, 15. Monitor trend. Fevers resolving.  -Current workup: CXR neg 9/30, UA negative, Lower extremity doppler negative for DVT  -WBC stable at 15.7 10/6  -Recheck Monday, no signs or symptoms of infection 13. Constipation refusing miralax most doses        -- LBM 10/19 at 0100  10/21- still refusing miralax- if no BM by tomorrow- will give Sorbitol 14. Bradycardia: continue to monitor HR TID- asymptomatic, reviewed EKG 11/19/21, essentially normal  15.  Pt reports insomnia but declines sleep aid 16.  Urinary urgency no dysuria, likely CVA related   10/21- again this AM- timed voiding.     LOS: 16 days A FACE TO FACE EVALUATION WAS PERFORMED  Caris Cerveny 12/18/2021, 10:27 AM

## 2021-12-18 NOTE — Progress Notes (Signed)
Physical Therapy Session Note  Patient Details  Name: Frank Warner MRN: [id number] Date of Birth: 1980/08/06  Today's Date: 12/18/2021 PT Individual Time: 0404-5913 PT Individual Time Calculation (min): 60 min   Short Term Goals: Week 3:  PT Short Term Goal 1 (Week 3): = to LTGs base on ELOS  Skilled Therapeutic Interventions/Progress Updates:     Pt greeted supine in bed and agreeable to therapy. No c/o pain and friend present in room.  Pt supine>sit EOB with SPV and minimal motivation this session as compared to previous session.   Gait training 145f x 2 and 2057f2 with CGA and no AD. Pt continues to have mild instability when walking with occasional L LE scissoring gait. Pt becomes unsteady occasionally, but is able to correct himself. Continues to require cues to increase attention to the L arm, leg, and environment and pt having multiple instances today of colliding left arm with wall of other objects.   Challenged dynamic balance L LE strength and environmental scanning with obstacles course consisting of 7# L ankle weight, agility ladder, hurdles, and playing basketball (shooting, passing, rebounding).  Majority CGA with some instances of min assist when pt would lose balance. Had pt walk through agility ladder fwd x 2 laps and lateral x 2 laps, fwd and laterally over hurdles x 2 each, and shooting, rebounding, and passing basketball. Pt continues to present with decreased attention to L arm with occasional bumping into things, even with heavy cuing.  Pt then collected laundry from drying with CGA and ambulated back to room 15030fith CGA while carried laundry in both arms. Assisted pt with folding laundry using hand over hand facilitation of L hand. Pt able to lightly grasp clothing with the L hand, but total assist needed to reach and fold with the L hand.  Sit> supine and positioning in bed with SPV.   Pt left supine in bed with call bell in reach, bed alarm on, all needs met.    Therapy Documentation Precautions:  Precautions Precautions: Fall Precaution Comments: L inattention, L hemiparesis, L lean, hx of seizures Restrictions Weight Bearing Restrictions: No  Therapy/Group: Individual Therapy  Frank MemosPT 12/18/2021, 3:21 PM

## 2021-12-19 NOTE — Progress Notes (Signed)
Occupational Therapy Session Note  Patient Details  Name: Frank Warner MRN: [id number] Date of Birth: [birth year]  Today's Date: 12/19/2021 OT Individual Time: 6789-3810 OT Individual Time Calculation (min): 42 min    Short Term Goals: Week 3:  OT Short Term Goal 1 (Week 3): LTG= STG  Skilled Therapeutic Interventions/Progress Updates: Patient received resting in bed. Agreeable to getting OOB for OT treatment. Min assist with transfer to w/c. Patient educated on w/c mobility with LE's to therapy gym. Able to propel w/c with only occasional contact to avoid equipment in the hallway. Patient assisted to therapy mat for LUE neuro reeducation. Mobilization of LUE with good tolerance of full shoulder flexion and IR/ER ROM. Followed with use of [place] MAS for AA reaching with assist set to 5. Patient with moderate assist into shoulder flexion to simulate shoulder height reaching but did well pushing into the flexion assist to return left hand to left knee between each reach. Followed with biceps activation and assist through full ROM and LUE wt bearing at [facility] focusing on triceps activation. Patient with trace thumb activation but able to move fingers grossly through 10-25% ROM into flexion from neutral. Able to activate finger extension from composite fist with forearm in neutral. Patient transferred back to w/c with CGA and propelled himself with LE's and RUE back to his room. Good participation throughout treatment session. Continue with skilled OT POC to facilitate functional improvements.     Therapy Documentation Precautions:  Precautions Precautions: Fall Precaution Comments: L inattention, L hemiparesis, L lean, hx of seizures Restrictions Weight Bearing Restrictions: No General:   Vital Signs: Therapy Vitals Temp: 98.1 F (36.7 C) Pulse Rate: (!) 49 Resp: 18 BP: 124/69 Patient Position (if appropriate): Lying Oxygen Therapy SpO2: 97 % O2 Device: Room Air Pain:no c/o    ADL: ADL Eating: Supervision/safety Where Assessed-Eating: Bed level Grooming: Supervision/safety Where Assessed-Grooming: Sitting at sink Upper Body Bathing: Minimal assistance (to wash R UE) Where Assessed-Upper Body Bathing: Shower Lower Body Bathing: Contact guard Where Assessed-Lower Body Bathing: Shower Upper Body Dressing: Supervision/safety Where Assessed-Upper Body Dressing: Chair Lower Body Dressing: Minimal assistance (to tie shoes) Where Assessed-Lower Body Dressing: Wheelchair Toileting: Unable to assess (did not need to go) Where Assessed-Toileting: Bed level Toilet Transfer: Minimal assistance Toilet Transfer Method: Stand pivot Tub/Shower Transfer: Unable to assess Tub/Shower Transfer Method: Unable to assess Social research officer, government: Curator Method: Radiographer, therapeutic: Shower seat without back    Therapy/Group: Individual Therapy  Hermina Barters 12/19/2021, 3:35 PM

## 2021-12-19 NOTE — Progress Notes (Signed)
Physical Therapy Session Note  Patient Details  Name: Frank Warner MRN: [id number] Date of Birth: 28-Nov-1980  Today's Date: 12/19/2021 PT Individual Time: 1500-1600 PT Individual Time Calculation (min): 60 min   Short Term Goals:  Week 3:  PT Short Term Goal 1 (Week 3): = to LTGs base on ELOS  Skilled Therapeutic Interventions/Progress Updates:  Pt received resting in bed.  He denied pain.  L wrist splint already donned.  Supine> sit with supervision.  Pt donned bil high top shoes sitting EOB with set up.    Gait training over level tile, x 250' without AD, CG/close supervision.  Pt tends to veer L and does not attend to his pathway in hall.  Pt has continual  mild LOB, regained independently with resulting scissoring steps, and occasional poor clearance of L foot without LOB.    Therapeutic activities to promote clearance of L foot during gait:  -kicking cones with L foot x 8 with limited L hip and knee extension needed for speed and force of kicking.   -Stepping L and R through floor agility ladder, x 2 trials q direction.  Pt encountered orange slats 1/10 and 0/10 when moving L; 3/10 and 1/10 when moving R ,due to decreased attention to L foot.  CGA with frequent mild LOB backwards when standing with narrow BOS required in spaces of agility ladder.  Mild LOB regained independently. -Seated on raised mat, reciprocal scooting forward/backwards without use of UEs, for activation of L pelvic muscles and focused wt shifting.     Therapeutic activities to promote LUE function: -seated in chair, pt reached for balled-up tissues on floor with L hand with hand-over hand assistance to secure tissue in fingers.  When cued to extend L index finger, pt was successful in bringing tissue up into his lap without spillage 5/6 trials. -standing with cleaning cloth in L hand, and hand -over -hand assistance, pt cleaned BITS screen with large circular and vertical movements   neuromuscular re-education via  use of BITs for PT therapy/timed scanning program, in sitting using wand in R hand.  Pt also performed in standing on compliant mat, using R index finger, in mini-squat position, with 76% accuracy x 2 min each position.    At conclusion of session, pt sat EOB and doffed bil shoes.  Sit> supine with supervision. Bed alarm set and needs left at hand.   Therapy Documentation Precautions:  Precautions Precautions: Fall Precaution Comments: L inattention, L hemiparesis, L lean, hx of seizures Restrictions Weight Bearing Restrictions: No      Therapy/Group: Individual Therapy  Krystyne Tewksbury 12/19/2021, 4:16 PM

## 2021-12-19 NOTE — Plan of Care (Signed)
  Problem: Consults Goal: RH STROKE PATIENT EDUCATION Description: See Patient Education module for education specifics  Outcome: Progressing   Problem: RH BOWEL ELIMINATION Goal: RH STG MANAGE BOWEL WITH ASSISTANCE Description: STG Manage Bowel with mod I Assistance. Outcome: Not Progressing Goal: RH STG MANAGE BOWEL W/MEDICATION W/ASSISTANCE Description: STG Manage Bowel with Medication with mod I Assistance. Outcome: Not Progressing Note: Patient refuses bowel protocol and patient does not ask staff to use facilities.   Problem: RH SAFETY Goal: RH STG ADHERE TO SAFETY PRECAUTIONS W/ASSISTANCE/DEVICE Description: STG Adhere to Safety Precautions With cues Assistance/Device. Outcome: Progressing   Problem: RH COGNITION-NURSING Goal: RH STG USES MEMORY AIDS/STRATEGIES W/ASSIST TO PROBLEM SOLVE Description: STG Uses Memory Aids/Strategies With cues Assistance to Problem Solve. Outcome: Progressing   Problem: RH PAIN MANAGEMENT Goal: RH STG PAIN MANAGED AT OR BELOW PT'S PAIN GOAL Description: < 4 with prns Outcome: Progressing   Problem: RH KNOWLEDGE DEFICIT Goal: RH STG INCREASE KNOWLEDGE OF HYPERTENSION Description: Patient and mother will be able to HTN, manage care with medications and dietary modifications at discharge using handouts and educational resources independently Outcome: Progressing Goal: RH STG INCREASE KNOWLEGDE OF HYPERLIPIDEMIA Description: Patient and mother will be able to HLD, manage care with medications and dietary modifications at discharge using handouts and educational resources independently Outcome: Progressing Goal: RH STG INCREASE KNOWLEDGE OF STROKE PROPHYLAXIS Description: Patient and mother will be able to secondary risks with medications and dietary modifications at discharge using handouts and educational resources independently Outcome: Progressing   Problem: Activity: Goal: Ability to tolerate increased activity will improve Outcome:  Progressing   Problem: Respiratory: Goal: Ability to maintain a clear airway and adequate ventilation will improve Outcome: Progressing   Problem: Role Relationship: Goal: Method of communication will improve Outcome: Progressing   Problem: Education: Goal: Understanding of CV disease, CV risk reduction, and recovery process will improve Outcome: Progressing Goal: Individualized Educational Video(s) Outcome: Progressing   Problem: Activity: Goal: Ability to return to baseline activity level will improve Outcome: Progressing

## 2021-12-20 LAB — BASIC METABOLIC PANEL
Anion gap: 9 (ref 5–15)
BUN: 10 mg/dL (ref 6–20)
CO2: 25 mmol/L (ref 22–32)
Calcium: 9.3 mg/dL (ref 8.9–10.3)
Chloride: 104 mmol/L (ref 98–111)
Creatinine, Ser: 0.99 mg/dL (ref 0.61–1.24)
GFR, Estimated: >60 mL/min (ref >60)
Glucose, Bld: 91 mg/dL (ref 70–99)
Potassium: 4 mmol/L (ref 3.5–5.1)
Sodium: 138 mmol/L (ref 135–145)

## 2021-12-20 NOTE — Progress Notes (Signed)
Speech Language Pathology Daily Session Note  Patient Details  Name: Frank Warner MRN: [id number] Date of Birth: 1980/06/07  Today's Date: 12/20/2021 SLP Individual Time: 1303-1400 SLP Individual Time Calculation (min): 57 min  Short Term Goals: Week 3: SLP Short Term Goal 1 (Week 3): STG=LTG due to ELOS  Skilled Therapeutic Interventions: Skilled ST treatment focused on cognitive goals. Pt was greeted upright in wheelchair on arrival requiring extended time to put phone down to focus on therapy; intermittently distracted by game on phone throughout session despite requests to put phone away.   SLP facilitated sustained attention, working memory, and error awareness task using the BITS. Pt recalled up to 6 words in sequential order with min-to-mod A verbal/visual cues to achieve 63.5% accuracy. Performance is comparable to the first time this task was addressed on 10/12 in which pt also achieved 64% accuracy with recalling up to 5 words in sequential order.   SLP facilitated trail making task on BITS in which pt alternated between number and letter. Pt completed up to 12 numbers/letters within 4 minute duration and 7 errors. Pt was able to self correct errors with min A verbal cues.   SLP re-assessed cognition with the SLUMS. Pt scored 17/30 this date, as compared to 12/30 during initial assessment obtained on 12/03/21 (n=27). Pt with decreased problem solving, recall, and organizing, planning, and self monitoring on clock drawing. Pt initially wrote 19 numbers and acknowledged it was incorrect, however pt was unable to repair during 2nd attempt.   Pt continues to be known minimize errors. Suspect this attributes to decreased awareness of deficits, and/or decreased motivation. Pt stated "I just don't really care" during session so suspect pt does not see importance of cognitive related tasks.   At end of session pt asked, "if I have to go to the bathroom do you need to go with me?" to which SLP  replied yes and provided explanation. Pt then stated 'I don't need to go then." SLP offered suggestions to use urinal and/or could request NT for assistance if pt didn't want assist from this particular therapist. Pt declined all options. SLP made NT aware of this discussion.   Patient transferred to bed with sup A verbal cues to put safety measures in place, including moving w/c foot rests out of the way prior to standing. Pt was left in bed with alarm activated and immediate needs within reach at end of session. Continue per current plan of care.      Pain  None/denied  Therapy/Group: Individual Therapy  Doreatha Offer T Keali Mccraw 12/20/2021, 1:19 PM

## 2021-12-20 NOTE — Progress Notes (Signed)
Physical Therapy Session Note  Patient Details  Name: Frank Warner MRN: [id number] Date of Birth: March 13, 1980  Today's Date: 12/20/2021 PT Individual Time: 1420-1445 PT Individual Time Calculation (min): 25 min   Short Term Goals: Week 1:  PT Short Term Goal 1 (Week 1): Pt will perform bed mobility with CGA. PT Short Term Goal 1 - Progress (Week 1): Met PT Short Term Goal 2 (Week 1): Pt will perform sit<> stand transfer with CGA consistently and LRAD. PT Short Term Goal 2 - Progress (Week 1): Met PT Short Term Goal 3 (Week 1): Pt will perform bed<>chair transfer with CGA consistently and LRAD. PT Short Term Goal 3 - Progress (Week 1): Met PT Short Term Goal 4 (Week 1): Pt will amb 90 ft with mod assist +1 consistenly and LRAD. PT Short Term Goal 4 - Progress (Week 1): Met PT Short Term Goal 5 (Week 1): Pt will attempt stairs. PT Short Term Goal 5 - Progress (Week 1): Met Week 2:  PT Short Term Goal 1 (Week 2): Pt will perform bed mobility with SPV consistently. PT Short Term Goal 1 - Progress (Week 2): Met PT Short Term Goal 2 (Week 2): Pt will perform sit <> stand transfers with SBA and LRAD consistenly. PT Short Term Goal 2 - Progress (Week 2): Met PT Short Term Goal 3 (Week 2): Pt will perform bed <> chair transfers with SBA and LRAD consistenly. PT Short Term Goal 3 - Progress (Week 2): Met PT Short Term Goal 4 (Week 2): Pt will amb 152f with CGA and LRAD. PT Short Term Goal 4 - Progress (Week 2): Met PT Short Term Goal 5 (Week 2): Pt will perform stair negotiaton (3 steps) with CGA and LRAD. PT Short Term Goal 5 - Progress (Week 2): Met Week 3:  PT Short Term Goal 1 (Week 3): = to LTGs base on ELOS Week 4:     Skilled Therapeutic Interventions/Progress Updates:    Denies pain Supine to sit w/cues to attend to LUE. Dons shoes w/set up, additional time esp w/L.  Session w/focus on functional gait, dynamic balance. Gait >5036fincluding 5X ascend/descend ramp, 5X  ascend/descend stairs w/single rail w/step thru gait, changes in gait speed all w/supervision but mild decrese in clearance LLE thru swing approx 20% time/increases on ramp, no balance loss.  Standing slow marching and alternating kicks w/cga progressing to min assist w/fatigue Gait while dribbling basketball overall min assist w/increased balance loss.  Therapy Documentation Precautions:  Precautions Precautions: Fall Precaution Comments: L inattention, L hemiparesis, L lean, hx of seizures Restrictions Weight Bearing Restrictions: No    Therapy/Group: Individual Therapy  BaJerrilyn Cairo0/23/2023, 2:53 PM

## 2021-12-20 NOTE — Progress Notes (Signed)
Occupational Therapy Session Note  Patient Details  Name: Frank Warner MRN: [id number] Date of Birth: Jul 13, 1980  Today's Date: 12/20/2021 OT Individual Time: 0131-4388 OT Individual Time Calculation (min): 45 min    Short Term Goals: Week 3:  OT Short Term Goal 1 (Week 3): LTG= STG  Skilled Therapeutic Interventions/Progress Updates:    Patient agreeable to participate in OT session. Reports 0/10 pain level.   Patient participated in skilled OT session focusing on standing balance and NM re-ed to the LUE. Therapist facilitated left UE muscle activation utilizing muscle vibration, muscle tapping, and AA/ROM movement in order to facilitate greater muscle movement and ability to utilize LUE to complete functional ADL tasks.   Exercises completed:  Seated, LUE, AA/ROM, shoulder protraction/retraction, shoulder IR/er, wrist pronation/supination, wrist extension; 5X. Forearm placed on pillowcase on table top for shoulder movements. Vibration used to assist with muscle activation. Improved A/ROM noted during task especially with wrist supination and extension. During wrist extension, pt held extension for 5-10 seconds; 5X. Slight finger extension noted in index and thumb although not functional for use at this time.   All functional transfers completed during session with SBA and no device.      Therapy Documentation Precautions:  Precautions Precautions: Fall Precaution Comments: L inattention, L hemiparesis, L lean, hx of seizures Restrictions Weight Bearing Restrictions: No  Therapy/Group: Individual Therapy  Ailene Ravel, OTR/L,CBIS  Supplemental OT - [facility] and [facility]  12/20/2021, 7:45 AM

## 2021-12-20 NOTE — Progress Notes (Signed)
Patient ID: Frank Warner, male   DOB: 05-20-1980, 41 y.o.   MRN: [id number]  Ttb ordered through [employer]

## 2021-12-20 NOTE — Progress Notes (Signed)
Occupational Therapy Session Note  Patient Details  Name: Frank Warner MRN: [id number] Date of Birth: [birth year]  Today's Date: 12/20/2021 OT Individual Time: 1015-1130 OT Individual Time Calculation (min): 75 min    Short Term Goals: Week 2:  OT Short Term Goal 1 (Week 2): pt will complete 1/3 parts of toileting with MINA OT Short Term Goal 1 - Progress (Week 2): Met OT Short Term Goal 2 (Week 2): pt will demonstrate emergent awareness during functional mobility tasks OT Short Term Goal 2 - Progress (Week 2): Progressing toward goal OT Short Term Goal 3 (Week 2): pt will complete sit>stands with CGA with LRAD OT Short Term Goal 3 - Progress (Week 2): Met  Skilled Therapeutic Interventions/Progress Updates:     Self Care: Pt received resting in bed reporting no pain during session. Pt reported he would like to take a shower to begin skilled OT session. Pt ambulated to bathroom with CGA with no loss of balance noted. Pt doffed shirt while standing leaning against wall close supervision. Pt attempted to remove socks and pants while standing, but was provided verbal cues to doff clothing while seated on tub bench for safety. Pt doffed LB clothing CGA while sitting and standing to bring pants off waste. Pt completed UB bathing with min A to wash RUE and CGA to wash LB. Pt occasionally impulsive standing without warning and demonstrating decreased safety awareness. Pt re-educated on fall prevention and safety strategies. Pt ambulated to EOB CGA to completing dressing tasks. Pt donned shirt using hemi-dressing technique with close supervision. Pt donned brief, pants, socks, and shoes with min A to adjust socks, bring pants over loft side of waste, and tie shoes. Pt brushed his teeth while standing at sink with CGA.   NMR-ED: Pt propelled self to therapy gym in manual wc using RUE and feet with min verbal cues to navigate hallway to prevent running into obstacles. Pt transferred EOM close supervision.  Pt completed LUE functional activities to promote LUE function. Pt reached for jenga blocks while sitting EOM with verbal cues to extend fingers and then grasp the block. Pt demonstrated finger 2-/5 finger activation in gravity eliminated plane with 3-/5 finger flexion when giving maximal effort and increased time. Pt provided intermittent HOH A to grasp block to place it in basket located on the ground 1x10 reps. Pt demonstrating 3/5 wrist extension with education on utilizing tendonesis grasp when picking up and transporting items with noted improvement. Pt then completed functional reach activities grasping a large checker to place it in bin located to left side of pt. Moderate support provided for shoulder flexion during shoulder level reach with Pt able to extend and flex elbow 3- with maximal verbal cues and intermittent HOH to facilitate grasp 1x10 reps. Pt transferred back to wc with close supervision and propelled self back to room using RUE and Les with min A to navigate hallway. Pt was left sitting in his wc with posey belt on, call bell in reach, and all needs met. Good participation throughout treatment session.   Therapy Documentation Precautions:  Precautions Precautions: Fall Precaution Comments: L inattention, L hemiparesis, L lean, hx of seizures Restrictions Weight Bearing Restrictions: No  ADL: ADL Eating: Supervision/safety Where Assessed-Eating: Bed level Grooming: Supervision/safety Where Assessed-Grooming: Sitting at sink Upper Body Bathing: Minimal assistance (to wash R UE) Where Assessed-Upper Body Bathing: Shower Lower Body Bathing: Contact guard Where Assessed-Lower Body Bathing: Shower Upper Body Dressing: Supervision/safety Where Assessed-Upper Body Dressing: Chair Lower Body Dressing: Minimal  assistance (to tie shoes) Where Assessed-Lower Body Dressing: Wheelchair Toileting: Unable to assess (did not need to go) Where Assessed-Toileting: Bed level Toilet  Transfer: Minimal assistance Toilet Transfer Method: Stand pivot Tub/Shower Transfer: Unable to assess Tub/Shower Transfer Method: Unable to assess Social research officer, government: Curator Method: Radiographer, therapeutic: Shower seat without back  Therapy/Group: Individual Therapy  Janey Genta 12/20/2021, 11:54 AM

## 2021-12-20 NOTE — Progress Notes (Signed)
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 No results for input(s): "WBC", "HGB", "HCT", "PLT" in the last 72 hours.  Recent Labs    12/20/21 0529  NA 138  K 4.0  CL 104  CO2 25  GLUCOSE 91  BUN 10  CREATININE 0.99  CALCIUM 9.3     Intake/Output Summary (Last 24 hours) at 12/20/2021 [id number] Last data filed at 12/19/2021 1700 Gross per 24 hour  Intake 480 ml  Output --  Net 480 ml         Physical Exam: Vital Signs Blood pressure 100/65, pulse (!) 52, temperature 97.6 F (36.4 C), temperature source Oral, resp. rate 18, height 6' (1.829 m), weight 95.7 kg, SpO2 95 %.  General: No acute distress Mood and affect are appropriate Heart: Regular rate and rhythm no rubs murmurs or extra sounds Lungs: Clear to auscultation, breathing unlabored, no rales or wheezes Abdomen: Positive bowel sounds, soft nontender to palpation, nondistended Extremities: No clubbing, cyanosis, or edema Skin: No evidence of breakdown, no evidence of rash   Strength 5/5 RUE and RLE, 4 to 4+/5 LLE and 3- Elbow flexors and extensors,trace Left grip, 0/5 left finger extensors   Assessment/Plan: 1. Functional deficits which require 3+ hours per day of interdisciplinary therapy in a comprehensive inpatient rehab setting. Physiatrist is providing close team supervision and 24 hour management of active medical problems listed below. Physiatrist and rehab team continue to assess barriers to discharge/monitor patient progress toward functional and medical goals  Care Tool:  Bathing    Body parts bathed by patient: Left arm, Chest,  Abdomen, Front perineal area, Buttocks, Right upper leg, Left upper leg, Right lower leg, Left lower leg, Face   Body parts bathed by helper: Right arm     Bathing assist Assist Level: Minimal Assistance - Patient > 75%     Upper Body Dressing/Undressing Upper body dressing   What is the patient wearing?: Pull over shirt    Upper body assist Assist Level: Supervision/Verbal cueing    Lower Body Dressing/Undressing Lower body dressing      What is the patient wearing?: Pants, Incontinence brief     Lower body assist Assist for lower body dressing: Contact Guard/Touching assist     Toileting Toileting    Toileting assist Assist for toileting: Contact Guard/Touching assist     Transfers Chair/bed transfer  Transfers assist     Chair/bed transfer assist level: Contact Guard/Touching assist     Locomotion Ambulation   Ambulation assist      Assist level: Contact Guard/Touching assist Assistive device: No Device Max distance: 250   Walk 10 feet activity   Assist     Assist level: Supervision/Verbal cueing Assistive device: No Device   Walk 50 feet activity   Assist Walk 50 feet with 2 turns activity did  not occur: Safety/medical concerns  Assist level: Contact Guard/Touching assist Assistive device: No Device    Walk 150 feet activity   Assist Walk 150 feet activity did not occur: Safety/medical concerns  Assist level: Contact Guard/Touching assist Assistive device: No Device    Walk 10 feet on uneven surface  activity   Assist Walk 10 feet on uneven surfaces activity did not occur: Safety/medical concerns   Assist level: Contact Guard/Touching assist Assistive device: Other (comment) (no device.)   Wheelchair     Assist Is the patient using a wheelchair?: Yes Type of Wheelchair: Manual    Wheelchair assist level: Supervision/Verbal cueing Max wheelchair distance: 150    Wheelchair 50 feet with 2 turns  activity    Assist        Assist Level: Supervision/Verbal cueing   Wheelchair 150 feet activity     Assist      Assist Level: Supervision/Verbal cueing   Blood pressure 100/65, pulse (!) 52, temperature 97.6 F (36.4 C), temperature source Oral, resp. rate 18, height 6' (1.829 m), weight 95.7 kg, SpO2 95 %.  Medical Problem List and Plan: 1. 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  Antithrombotics: -DVT/anticoagulation:  Pharmaceutical: Lovenox may d/c due to amb 300'             -antiplatelet therapy: DAPT x 3 weeks (Plavix thru 10/25) followed by ASA alone.  3. Pain Management: Tylenol prn.  4. Mood/Behavior/Sleep: LCSW to follow for evaluation and support.              -antipsychotic agents: N/A 5. Neuropsych/cognition: This patient is not capable of making decisions on his own behalf. 6. Skin/Wound Care: routine pressure relief measures.              --continue supplements TID 7. Fluids/Electrolytes/Nutrition: Monitor I/O. Check lytes in am.             --hypoK resolved off KCL supplements      Latest Ref Rng & Units 12/20/2021    5:29 AM 12/13/2021    5:46 AM 12/06/2021    5:41 AM  BMP  Glucose 70 - 99 mg/dL 91  90  85   BUN 6 - 20 mg/dL 10  8  14    Creatinine 0.61 - 1.24 mg/dL 0.99  1.01  1.22   Sodium 135 - 145 mmol/L 138  138  134   Potassium 3.5 - 5.1 mmol/L 4.0  4.2  4.0   Chloride 98 - 111 mmol/L 104  103  98   CO2 22 - 32 mmol/L 25  26  23    Calcium 8.9 - 10.3 mg/dL 9.3  9.7  10.1   Normal BMET 10/23  8. Seizures: Continue Keppra 1500 mg BID             -- GTC at home on presentation, cEEG 9/22-23 showed no epileptiform discharges.  9. ABLA: Resolved 10. HTN: Monitor BP  TID--controlled off meds Vitals:   12/19/21 1922 12/20/21 0337  BP: 123/74 100/65  Pulse: (!) 51 (!) 52  Resp: 18 18  Temp: 98.3 F (36.8 C) 97.6 F (36.4 C)  SpO2: 97% 95%   Hx HTN but BPs ok will cont to monitor   10/21- HR running 40s-50s- off meds- likely his baseline- will monitor- no Sx's.  11. Hyperlipidemia: LDL 162--continue Crestor 40 mg/day 12. Leucocytosis: Downtrending, 15. Monitor trend. Fevers resolving.  -Current workup: CXR neg 9/30, UA negative, Lower extremity doppler negative for DVT  -WBC stable at 15.7 10/6  -Recheck Monday, no signs or symptoms of infection 13. Constipation refusing miralax most doses        -- LBM 10/19 at 0100  10/21- still refusing miralax- if no BM by tomorrow- will give Sorbitol 14. Bradycardia: continue to monitor HR TID- asymptomatic, reviewed EKG 11/19/21, essentially normal  15.  Pt reports insomnia but declines sleep aid 16.  Urinary urgency no dysuria, likely CVA related   10/21- again this AM- timed voiding.     LOS: 18 days A FACE TO FACE EVALUATION WAS PERFORMED  Charlett Blake 12/20/2021, 9:27 AM

## 2021-12-21 NOTE — Progress Notes (Signed)
Physical Therapy Session Note  Patient Details  Name: Frank Warner MRN: [id number] Date of Birth: Jun 02, 1980  Today's Date: 12/21/2021 PT Individual Time: 8329-1916 PT Individual Time Calculation (min): 60 min   Short Term Goals: Week 3:  PT Short Term Goal 1 (Week 3): = to LTGs base on ELOS  Skilled Therapeutic Interventions/Progress Updates:     Pt greeted supine in bed and agreeable to therapy. No c/o pain.   Educated pt today regarding inability to drive after discharge from the hospital until cleared by his MD.   Sup>sit with mod I and don shoes with set up assist.  Sit> stand from EOB to no AD with SPV and amb 261f with SPV from room to main therapy gym no scissoring of gait or colliding with objects on his left side due to increased attention to L, minimal cues still needed.   Obstacle course set up to include agility ladder, hurdles, and cones. Addition of R UE involvement via dribbling a basketball. Pt required CGA while performing fwd stepping through agility ladder, stepping over hurdles, and zigzagging through cones. Pt struggles with maintaining control of feet while stepping in ladder and dribbling the ball at the same time. Course completed 3 times with pt becoming unsteady occasionally, but able to correct without increased assistance.   Gait training >10049fwith no AD throughout hospital lobby, elevators, foot court, and outside. Pt requiring SPV while inside the hospital on level surfaces and cuing for navigating elevator buttons. Ranging between SPV and CGA when outside. CGA needed outside when navigating steps, curb, ramps, and sharp turns on sidewalk. Increased cuing needed to increase attention to L environment and to increase L foot clearance when on ramps and steps to avoid LOB.   Once back in room pt stand>sit from no AD to bed with SPV, doffed shoes mod I, and sit>supine with mod I,  and cues to scoot up in bet.  Pt left supine, set up for dinner, bed alarm on,  call bell in reach, all needs met.  Therapy Documentation Precautions:  Precautions Precautions: Fall Precaution Comments: L inattention, L hemiparesis, L lean, hx of seizures Restrictions Weight Bearing Restrictions: No  Therapy/Group: Individual Therapy  AnKayleen MemosSPT 12/21/2021, 5:38 PM

## 2021-12-21 NOTE — Progress Notes (Signed)
PROGRESS NOTE   Subjective/Complaints:  No issues overnite , bloodwork reviewed, nl    ROS- neg CP, SOB, N/V/D Objective:   No results found. No results for input(s): "WBC", "HGB", "HCT", "PLT" in the last 72 hours.  Recent Labs    12/20/21 0529  NA 138  K 4.0  CL 104  CO2 25  GLUCOSE 91  BUN 10  CREATININE 0.99  CALCIUM 9.3     Intake/Output Summary (Last 24 hours) at 12/21/2021 0753 Last data filed at 12/20/2021 1544 Gross per 24 hour  Intake 120 ml  Output 800 ml  Net -680 ml         Physical Exam: Vital Signs Blood pressure 113/72, pulse 62, temperature 97.7 F (36.5 C), resp. rate 18, height 6' (1.829 m), weight 95.7 kg, SpO2 97 %.  General: No acute distress Mood and affect are appropriate Heart: Regular rate and rhythm no rubs murmurs or extra sounds Lungs: Clear to auscultation, breathing unlabored, no rales or wheezes Abdomen: Positive bowel sounds, soft nontender to palpation, nondistended Extremities: No clubbing, cyanosis, or edema Skin: No evidence of breakdown, no evidence of rash   Strength 5/5 RUE and RLE, 4 to 4+/5 LLE and 3- Elbow flexors and extensors,trace Left grip, 0/5 left finger extensors   Assessment/Plan: 1. Functional deficits which require 3+ hours per day of interdisciplinary therapy in a comprehensive inpatient rehab setting. Physiatrist is providing close team supervision and 24 hour management of active medical problems listed below. Physiatrist and rehab team continue to assess barriers to discharge/monitor patient progress toward functional and medical goals  Care Tool:  Bathing    Body parts bathed by patient: Left arm, Chest, Abdomen, Front perineal area, Buttocks, Right upper leg, Left upper leg, Right lower leg, Left lower leg, Face   Body parts bathed by helper: Right arm     Bathing assist Assist Level: Minimal Assistance - Patient > 75%     Upper Body  Dressing/Undressing Upper body dressing   What is the patient wearing?: Pull over shirt    Upper body assist Assist Level: Supervision/Verbal cueing    Lower Body Dressing/Undressing Lower body dressing      What is the patient wearing?: Pants, Incontinence brief     Lower body assist Assist for lower body dressing: Contact Guard/Touching assist     Toileting Toileting    Toileting assist Assist for toileting: Contact Guard/Touching assist     Transfers Chair/bed transfer  Transfers assist     Chair/bed transfer assist level: Contact Guard/Touching assist     Locomotion Ambulation   Ambulation assist      Assist level: Contact Guard/Touching assist Assistive device: No Device Max distance: 250   Walk 10 feet activity   Assist     Assist level: Supervision/Verbal cueing Assistive device: No Device   Walk 50 feet activity   Assist Walk 50 feet with 2 turns activity did not occur: Safety/medical concerns  Assist level: Contact Guard/Touching assist Assistive device: No Device    Walk 150 feet activity   Assist Walk 150 feet activity did not occur: Safety/medical concerns  Assist level: Contact Guard/Touching assist Assistive device: No Device  Walk 10 feet on uneven surface  activity   Assist Walk 10 feet on uneven surfaces activity did not occur: Safety/medical concerns   Assist level: Contact Guard/Touching assist Assistive device: Other (comment) (no device.)   Wheelchair     Assist Is the patient using a wheelchair?: Yes Type of Wheelchair: Manual    Wheelchair assist level: Supervision/Verbal cueing Max wheelchair distance: 150    Wheelchair 50 feet with 2 turns activity    Assist        Assist Level: Supervision/Verbal cueing   Wheelchair 150 feet activity     Assist      Assist Level: Supervision/Verbal cueing   Blood pressure 113/72, pulse 62, temperature 97.7 F (36.5 C), resp. rate 18, height  6' (1.829 m), weight 95.7 kg, SpO2 97 %.  Medical Problem List and Plan: 1. 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  Antithrombotics: -DVT/anticoagulation:  Pharmaceutical: Lovenox may d/c due to amb 300'             -antiplatelet therapy: DAPT x 3 weeks (Plavix thru 10/25) followed by ASA alone.  3. Pain Management: Tylenol prn.  4. Mood/Behavior/Sleep: LCSW to follow for evaluation and support.              -antipsychotic agents: N/A 5. Neuropsych/cognition: This patient is not capable of making decisions on his own behalf. 6. Skin/Wound Care: routine pressure relief measures.              --continue supplements TID 7. Fluids/Electrolytes/Nutrition: Monitor I/O. Check lytes in am.             --hypoK resolved off KCL supplements      Latest Ref Rng & Units 12/20/2021    5:29 AM 12/13/2021    5:46 AM 12/06/2021    5:41 AM  BMP  Glucose 70 - 99 mg/dL 91  90  85   BUN 6 - 20 mg/dL 10  8  14    Creatinine 0.61 - 1.24 mg/dL  1.69  6.78   Sodium 135 - 145 mmol/L 138  138  134   Potassium 3.5 - 5.1 mmol/L 4.0  4.2  4.0   Chloride 98 - 111 mmol/L 104  103  98   CO2 22 - 32 mmol/L 25  26  23    Calcium 8.9 - 10.3 mg/dL 9.3  9.7  9.38   Normal BMET 10/23  8. Seizures: Continue Keppra 1500 mg BID             -- GTC at home on presentation, cEEG 9/22-23 showed no epileptiform discharges.  9. ABLA: Resolved 10. HTN: Monitor BP TID--controlled off meds Vitals:   12/20/21 1535 12/20/21 2028  BP: 105/70 113/72  Pulse: (!) 58 62  Resp: 18 18  Temp: 98.2 F (36.8 C) 97.7 F (36.5 C)  SpO2: 100% 97%   Hx HTN but BPs ok will cont to monitor    11. Hyperlipidemia: LDL 162--continue Crestor 40 mg/day 12.  Leucocytosis: Downtrending, 15. Monitor trend. Fevers resolving.  -Current workup: CXR neg  9/30, UA negative, Lower extremity doppler negative for DVT  -WBC stable at 15.7 10/6  -Recheck Monday, no signs or symptoms of infection 13. Constipation refusing miralax most doses        -- LBM 10/22  14. Bradycardia: continue to monitor HR TID- asymptomatic, reviewed EKG 11/19/21, essentially normal  15.  Pt reports insomnia but declines sleep aid 16.  Urinary urgency no dysuria, likely CVA related   10/24 spilled urinal     LOS: 19 days A FACE TO FACE EVALUATION WAS PERFORMED  Erick Colace 12/21/2021, 7:53 AM

## 2021-12-21 NOTE — Progress Notes (Signed)
Patient ID: Frank Warner, male   DOB: Sep 15, 1980, 41 y.o.   MRN: [id number]  SW spoke with patient's mother, Frank Warner and discussed FMLA. Patient mother unable to access portal to obtain FMLA benefits and job will not release information to anyone except patient. Patient has forgotten his password to obtain information. Sw and mother discussed providing letter form physician to obtain, mother was informed even with letter information will not be provided. This can only be provided to employee/patient.

## 2021-12-21 NOTE — Progress Notes (Signed)
Occupational Therapy Session Note  Patient Details  Name: Frank Warner MRN: [id number] Date of Birth: [birth year]  Today's Date: 12/21/2021 OT Individual Time: 1100-1200 OT Individual Time Calculation (min): 60 min    Short Term Goals: Week 2:  OT Short Term Goal 1 (Week 2): pt will complete 1/3 parts of toileting with MINA OT Short Term Goal 1 - Progress (Week 2): Met OT Short Term Goal 2 (Week 2): pt will demonstrate emergent awareness during functional mobility tasks OT Short Term Goal 2 - Progress (Week 2): Progressing toward goal OT Short Term Goal 3 (Week 2): pt will complete sit>stands with CGA with LRAD OT Short Term Goal 3 - Progress (Week 2): Met  Skilled Therapeutic Interventions/Progress Updates:     Pt received resting in bed in good spirits and ready to participate in therapy. Pt excited to show OT improvement in function of LUE. Pt demonstrated ability to reach LUE into ~130 degrees of shoulder flexion x2 when sitting with his back supported when giving maximal effort and increased time. Pt congratulated and provided with educating to keep using his LUE during functional tasks to improve return of muscle function. Pt requested to take a shower to begin OT session. Pt supine>sitting Eob supervision. Pt ambulated to bathroom with close supervision and verbal cues for safety. Pt doffed sock and shirt while standing, leaning against wall for support and doffed pants sitting on shower chair. Pt completed LB bathing with supervision and UB bathing with min A to wash his upper R upper arm with HOH A to improve functional use. Pt was able to wash his R lower arm when provided with moderate verbal cues and motivation to utilize his L arm. Following shower, Pt dried self and ambulated back to EOB with close supervision. Upon returning to EOB, Pt plopped self down onto bed without control or awareness of placement of LUE and collapse towards his left side. Pt found this humerus and was provided  skilled education on importance of safety awareness and fall prevent. Pt stated he did not want to hear anything else about "fall prevention" or "safety" and reported those were going to become his "trigger words". Pt donned socks, brief, and pants sitting EOB with CGA and maximal encouragement to utilize LUE during task. Pt able to extend and flex fingers on L hand to grasp his clothing requiring HOH A to maintain tight grasp to use L hand to adjust/pull up clothing. Pt utilized L hand to reach for deodorant when presented at shoulder level in front of him and maximal verbal cues to "squeeze" the  deodorant to apply it under his R arm. Pt dropped his deodorant after moving it under his arm and was provided with [facility] to finish task. Pt donned his shirt sitting EOB using hemi-dressing strategy with supervision. Pt stood at sink to brush his teeth with close supervision. Pt was left resting in his bed with bed alarm on, call bell in reach, and all needs met.    Therapy Documentation Precautions:  Precautions Precautions: Fall Precaution Comments: L inattention, L hemiparesis, L lean, hx of seizures Restrictions Weight Bearing Restrictions: No    Pain: Pain Assessment Pain Scale: 0-10 Pain Score: 0-No pain ADL: ADL Eating: Supervision/safety Where Assessed-Eating: Bed level Grooming: Supervision/safety Where Assessed-Grooming: Standing at sink Upper Body Bathing: Minimal assistance Where Assessed-Upper Body Bathing: Shower Lower Body Bathing: Supervision/safety Where Assessed-Lower Body Bathing: Shower Upper Body Dressing: Supervision/safety Where Assessed-Upper Body Dressing: Edge of bed Lower Body Dressing: Contact guard  Where Assessed-Lower Body Dressing: Edge of bed Toileting: Unable to assess (did not need to go) Where Assessed-Toileting: Bed level Toilet Transfer: Minimal assistance Toilet Transfer Method: Stand pivot Tub/Shower Transfer: Unable to assess Tub/Shower Transfer  Method: Unable to assess Social research officer, government: Curator Method: Heritage manager: Shower seat with back   Therapy/Group: Individual Therapy  Janey Genta 12/21/2021, 12:31 PM

## 2021-12-21 NOTE — Progress Notes (Signed)
Speech Language Pathology Daily Session Note  Patient Details  Name: Frank Warner MRN: [id number] Date of Birth: 01-10-1981  Today's Date: 12/21/2021 SLP Individual Time: 5621-3086 SLP Individual Time Calculation (min): 41 min  Short Term Goals: Week 3: SLP Short Term Goal 1 (Week 3): STG=LTG due to ELOS  Skilled Therapeutic Interventions: Skilled ST treatment focused on cognitive goals. SLP facilitated session by administering ALFA "solving daily math problems" subtest. Pt achieved 7/10 accuracy at independent level, progressing to 9/10 accuracy with sup A verbal cues for problem solving/calculations.   SLP facilitated functional, hypothetical problem solving scenarios pertaining to medication management. Pt responded appropriately with good attention to detail, awareness, and regard to safety with mod I. Pt reported he plans to be diligent with taking prescribed medications to prevent future strokes/seizures from occurring. Although pt discussed his disinterest in therapy as a whole, he stated he knows he needs to continue to participate because he wants to get better so he can move on and "live my life."  Plan to discuss stroke symptoms/provide further stroke education during upcoming session per pt request.   Patient was left in bed with alarm activated and immediate needs within reach at end of session. Continue per current plan of care.       Pain Pain Assessment Pain Scale: 0-10 Pain Score: 0-No pain  Therapy/Group: Individual Therapy  Frank Warner T Sarah Zerby 12/21/2021, 1:51 PM

## 2021-12-21 NOTE — Progress Notes (Addendum)
Occupational Therapy Session Note  Patient Details  Name: My Madariaga MRN: [id number] Date of Birth: [birth year]  Today's Date: 12/21/2021 OT Individual Time: 0109-3235 OT Individual Time Calculation (min): 50 min    Short Term Goals: Week 3:  OT Short Term Goal 1 (Week 3): LTG= STG  Skilled Therapeutic Interventions/Progress Updates:  Pt greeted supine in bed, pt agreeable to OT intervention. Pt completed supine>sit with supervision and stand pivot transfer from EOB>w/c with no AD and supervision. Total A transport in gym to w/c. Worked on functional reach with LUE with pt standing at railing with pt instructed to use LUE to reach to rail to retrieve horseshoes for NMR, pt with accurate targeted reach but presents with difficulty manipulating digits to grasp horseshoe needing hand over hand assist. Graded task down to have pt reach to railing but this time to grasp wash cloths with improved success. Pt completed task in standing with supervision.  Pt reports urgent need to void bowels, total A transfer back to room in w/c. Pt completed ambulatory toilet transfer with no AD and supervision, pt completed 3/3 toileting tasks with supervision but continued to have continued BM. Pt left seated on toilet with NT aware.   Therapy Documentation Precautions:  Precautions Precautions: Fall Precaution Comments: L inattention, L hemiparesis, L lean, hx of seizures Restrictions Weight Bearing Restrictions: No  Pain: No pain     Therapy/Group: Individual Therapy  Precious Haws 12/21/2021, 3:55 PM

## 2021-12-22 ENCOUNTER — Other Ambulatory Visit: Payer: Self-pay

## 2021-12-22 MED ORDER — ROSUVASTATIN CALCIUM 40 MG PO TABS
40.0000 mg | ORAL_TABLET | Freq: Every day | ORAL | 0 refills | Status: DC
  Filled 2021-12-22: qty 30, 30d supply, fill #0

## 2021-12-22 MED ORDER — PANTOPRAZOLE SODIUM 40 MG PO TBEC
40.0000 mg | DELAYED_RELEASE_TABLET | Freq: Every day | ORAL | Status: DC
  Administered 2021-12-23 – 2021-12-24 (×2): 40 mg via ORAL
  Filled 2021-12-22 (×2): qty 1

## 2021-12-22 MED ORDER — ADULT MULTIVITAMIN W/MINERALS CH
1.0000 | ORAL_TABLET | Freq: Every day | ORAL | 0 refills | Status: AC
  Filled 2021-12-22: qty 30, 30d supply, fill #0

## 2021-12-22 MED ORDER — ASPIRIN 81 MG PO TBEC
81.0000 mg | DELAYED_RELEASE_TABLET | Freq: Every day | ORAL | 0 refills | Status: AC
  Filled 2021-12-22: qty 30, 30d supply, fill #0

## 2021-12-22 MED ORDER — LEVETIRACETAM 750 MG PO TABS
1500.0000 mg | ORAL_TABLET | Freq: Two times a day (BID) | ORAL | 0 refills | Status: DC
  Filled 2021-12-22: qty 120, 30d supply, fill #0

## 2021-12-22 MED ORDER — LEVETIRACETAM 750 MG PO TABS
1500.0000 mg | ORAL_TABLET | Freq: Two times a day (BID) | ORAL | Status: DC
  Administered 2021-12-22 – 2021-12-24 (×4): 1500 mg via ORAL
  Filled 2021-12-22 (×4): qty 2

## 2021-12-22 MED ORDER — PANTOPRAZOLE SODIUM 40 MG PO TBEC
40.0000 mg | DELAYED_RELEASE_TABLET | Freq: Every day | ORAL | 0 refills | Status: DC
  Filled 2021-12-22: qty 30, 30d supply, fill #0

## 2021-12-22 NOTE — Progress Notes (Signed)
Physical Therapy Session Note  Patient Details  Name: Frank Warner MRN: [id number] Date of Birth: [birth year]  Today's Date: 12/22/2021 PT Individual Time: 5397-6734 PT Individual Time Calculation (min): 70 min   Short Term Goals: Week 3:  PT Short Term Goal 1 (Week 3): = to LTGs base on ELOS  Skilled Therapeutic Interventions/Progress Updates:     Pt greeted seated on EOB with NT present stating she just helped him from his WC into the bed. Pt agreeable to therapy with no c/o pain.  Donned shoes with mod I.  Tested LE strength and sensation. R LE MMT WNL tested in sitting, L LE hip flexion 4+/5, L knee flexion 4/5, L knee extension and L ankle DF 5/5, L plantar flexion 4-/5.  Sensation to light tough intact and similar on both sides.   Gait training performed 117f x 2 with no AD and SPV. Minimal scissoring gait, but cuing continues to be needed to increased attention to L environment due to pt continuing to bump into obstacles with his L arm. Improved since last week though.   Bathroom use with pt amb from main therapy gym to closest bathroom, ~1068f with no AD and SPV, continent of bladder in standing with SPV and able to perform hand washing and personal hygiene with SPV. Amb 10030fack to main therapy gym with no AD and SPV and same cuing as above.   Performed Functional Gait Assessment, see detailed results below, and pt scored a 21, which is associated with a medium fall risk. SPV/CGA provided throughout assessment with pt having the most difficulty with head turning vertically and tandem walking, both of which pt would become unsteady and lose balance, but able to self correct.   Discussed return to work with patient and notified SW to further educate pt on options for financial support prior to return to work.  Discussed HEP with pt and educated pt on the benefit of probono outpatient physical therapy due to lack of insurance. Pt verbalized understanding.   Pt left in L sidelying  with HOB elevated. Bed alarm on, call bell in reach, all needs met.   Therapy Documentation Precautions:  Precautions Precautions: Fall Precaution Comments: L inattention, L hemiparesis, L lean, hx of seizures Restrictions Weight Bearing Restrictions: No    Balance: Standardized Balance Assessment Standardized Balance Assessment: Functional Gait Assessment Functional Gait  Assessment Gait assessed : Yes Gait Level Surface: Walks 20 ft in less than 5.5 sec, no assistive devices, good speed, no evidence for imbalance, normal gait pattern, deviates no more than 6 in outside of the 12 in walkway width. Change in Gait Speed: Able to smoothly change walking speed without loss of balance or gait deviation. Deviate no more than 6 in outside of the 12 in walkway width. Gait with Horizontal Head Turns: Performs head turns smoothly with slight change in gait velocity (eg, minor disruption to smooth gait path), deviates 6-10 in outside 12 in walkway width, or uses an assistive device. Gait with Vertical Head Turns: Performs task with slight change in gait velocity (eg, minor disruption to smooth gait path), deviates 6 - 10 in outside 12 in walkway width or uses assistive device Gait and Pivot Turn: Pivot turns safely in greater than 3 sec and stops with no loss of balance, or pivot turns safely within 3 sec and stops with mild imbalance, requires small steps to catch balance. Step Over Obstacle: Is able to step over one shoe box (4.5 in total height) without changing  gait speed. No evidence of imbalance. Gait with Narrow Base of Support: Ambulates 4-7 steps. Gait with Eyes Closed: Walks 20 ft, uses assistive device, slower speed, mild gait deviations, deviates 6-10 in outside 12 in walkway width. Ambulates 20 ft in less than 9 sec but greater than 7 sec. Ambulating Backwards: Walks 20 ft, uses assistive device, slower speed, mild gait deviations, deviates 6-10 in outside 12 in walkway width. Steps:  Alternating feet, must use rail. Total Score: 21   Therapy/Group: Individual Therapy  Kayleen Memos, SPT 12/22/2021, 11:49 AM

## 2021-12-22 NOTE — Patient Care Conference (Cosign Needed)
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 Alysia Penna Social Worker Present: Erlene Quan, BSW Nurse Present: Dorien Chihuahua, RN PT Present: Page Spiro, PT OT Present:  Cecille Rubin, OT) SLP Present: Sherren Kerns, SLP PPS Coordinator present : Gunnar Fusi, SLP     Current Status/Progress Goal Weekly Team Focus  Bowel/Bladder   patient is continent of bladder and bowel LBM 12/21/21  maintain continence  assess q shift and prn   Swallow/Nutrition/ Hydration   regular diet, thin liquids  sup A  tolerance of current diet with continued implemnetation of safe swallowing precautions   ADL's   Close supervision sit>stand, close supervision LB bathing/dresing, min A UB bathing to wash upper R arm; pt able to use L arm to wash R lower arm, supervision UB dressing, CGA toileting, Improved return of shoulder flexors, bicep flexion, tricep extension, finger extension, wrist extension, and finger flexion. Continued impraired safety awareness (provided education each session whitout demosntration of retention), impaired balance,  supervision - MIN A goals  BADLS reeducaiton, LUE NMR, functional mobility, dynamic sitting/standing balance   Mobility   no follow up therapy due to no insurance, no DME needed, reiterate no driving until cleared by MD. SPV  for transfers, SPV for amb on even ground/controlled envornment no AD for 359ft, CGA for uneven ground no AD, SPV bed mobility. Improving L inattention, but still a factor. No urge incontinence last 7 days for PT.  SPV at an ambulatory level with LRAD  Increasing L attention, gait, transfer, and stair training with LRAD, dynamic standing balance, L hemibody NMR.   Communication             Safety/Cognition/ Behavioral Observations  sup-to-min A, difficult to assess at times due to some resistance toward cognitive treatment; mod A for awareness of deficits  sup-to-min A  problem solving, awareness of deficits, attention, memory   Pain   patient is pain free  remain pain free  assess q shift and prn   Skin   skin clear  skin to remain intact  assess q shift and prn     Discharge Planning:  Patient uninsured. Discharging home with mother with mother and family to assist 24/7   Team Discussion: Patient with poor insight into deficits and poor safety awareness post left MCV CVA. Mild - mod cognitive deficits and needs supervision to complete tasks. Tolerating a regular/thin diet.  Patient on target to meet rehab goals: yes, currently needs CGA for ramps, steps and able to ambulate up to 1000' with supervision over even ground and confined environments.  *See Care Plan and progress notes for long and short-term goals.   Revisions to Treatment Plan:  N/a   Teaching Needs: No driving for 6 months post seizures and clearance from neurology. Safety, medications, dietary modification, etc.   Current Barriers to Discharge: Decreased  caregiver support and Home enviroment access/layout and lack of insurance for follow up services.  Possible Resolutions to Barriers: Family education DME: TTB     Medical Summary Current Status: Poor awareness of deficits, left upper extremity with poor motor control although gross motor has improved, reduced balance, on seizure precautions  Barriers to  Discharge: Medication compliance;Other (comments)  Barriers to Discharge Comments: Poor awareness of deficits due to stroke location Possible Resolutions to Barriers/Weekly Focus: No driving no return to work, plan for discharge later this week, switch seizure medications to p.o. route   Continued Need for Acute Rehabilitation Level of Care: The patient requires daily medical management by a physician with specialized training in physical medicine and rehabilitation for the following reasons: Direction of a multidisciplinary physical rehabilitation program to maximize functional independence : Yes Medical management of patient stability for increased activity during participation in an intensive rehabilitation regime.: Yes Analysis of laboratory values and/or radiology reports with any subsequent need for medication adjustment and/or medical intervention. : Yes   I attest that I was present, lead the team conference, and concur with the assessment and plan of the team.   Chana Bode B 12/22/2021, 3:25 PM

## 2021-12-22 NOTE — Progress Notes (Signed)
Occupational Therapy Session Note  Patient Details  Name: Frank Warner MRN: [id number] Date of Birth: March 04, 1980  Today's Date: 12/22/2021 OT Individual Time: 1100-1200 OT Individual Time Calculation (min): 1hour OT Individual Time: 1500-1530 OT Individual Time Calculation (min): 30 min   Short Term Goals: Week 3:  OT Short Term Goal 1 (Week 3): LTG= STG  Skilled Therapeutic Interventions/Progress Updates:     Session 1: Pt received resting in bed reporting he would like to shower and motivated to participate in therapy. Pt no pain during session. Pt completed shower in tub bath with tub bench to practice transfers and functional mobility in preparation for d/c home on 10/27. Pt propelled self to shower in wc with supervision and min verbal cues to navigate hallway. Pt educated on safety in shower and tub bench transfer demonstrating good teach back during session. Pt completed U/L bathing with close supervision standing to clean bottom and peri area. Pt able to use LUE to wash his RUE during shower today with improved activation of biceps and finger flexion. Pt completed U/LB dressing while sitting on tub bench with close supervision. Pt required min a to adjust posterior portion of L shoe. Pt donned deodorant with min HOH to maintian grasp on deopdorant when applying under R arm. Pt able to extend L arm to ~130 degrees shoulder flexion for a few seconds to apply deodorant under arm. Pt propelled self back to room using BLE and RUE with supervision. Pt left resting in wc with posey belt on, call bell in reach, and all needs met.   Session 2: Pt received resting in bed willing to participate in skilled therapy session. Pt reported to therapist that he was going to buy a pack of cigarettes as soon as he leaves the hospital. OT provided education the effects of smoking and risk factors for having another CVA directing Pt to handouts located in Pt handbook. Pt was not receptive of education at this  time. Pt ambulated to therapy gym without AD with close supervision. Pt completed LUE therapeutic exercises while holding hourse shoe to facilitate improved grasp 2 x 8 reps of wrist extension, forearm supination/pronation, and bicep flexion. Pt able to extend fingers to 75% ROM when giving maximal effort. Pt practiced functional reach extending/flexing fingers in gravity eliminated plane and lifting horse shoes to shoulder level to place it on horizontal bar. Pt continuously stated he was tired throughout session with therapeutic motivation provided. Pt ambulated back to room with close supervision and was left resting in his bed with call bell in reach, bed alarm on, and all needs met.   Therapy Documentation Precautions:  Precautions Precautions: Fall Precaution Comments: L inattention, L hemiparesis, L lean, hx of seizures Restrictions Weight Bearing Restrictions: No   Vital Signs: Therapy Vitals Temp: 98.5 F (36.9 C) Temp Source: Oral Pulse Rate: 81 Resp: 17 BP: 116/78 Patient Position (if appropriate): Sitting Oxygen Therapy SpO2: 98 % O2 Device: Room Air   ADL: ADL Eating: Supervision/safety Where Assessed-Eating: Bed level Grooming: Supervision/safety Where Assessed-Grooming: Standing at sink Upper Body Bathing: Contact guard Where Assessed-Upper Body Bathing: Shower Lower Body Bathing: Supervision/safety Where Assessed-Lower Body Bathing: Shower Upper Body Dressing: Supervision/safety, Minimal cueing Where Assessed-Upper Body Dressing: Wheelchair Lower Body Dressing: Contact guard Where Assessed-Lower Body Dressing: Wheelchair Toileting: Unable to assess (did not need to go) Where Assessed-Toileting: Glass blower/designer: Therapist, music Method: Ambulating Tub/Shower Transfer: Metallurgist Method: Optometrist: Producer, television/film/video  guard Social research officer, government Method:  Print production planner with back  International aid/development worker Standardized Balance Assessment: Functional Gait Assessment Functional Gait  Assessment Gait assessed : Yes Gait Level Surface: Walks 20 ft in less than 5.5 sec, no assistive devices, good speed, no evidence for imbalance, normal gait pattern, deviates no more than 6 in outside of the 12 in walkway width. Change in Gait Speed: Able to smoothly change walking speed without loss of balance or gait deviation. Deviate no more than 6 in outside of the 12 in walkway width. Gait with Horizontal Head Turns: Performs head turns smoothly with slight change in gait velocity (eg, minor disruption to smooth gait path), deviates 6-10 in outside 12 in walkway width, or uses an assistive device. Gait with Vertical Head Turns: Performs task with slight change in gait velocity (eg, minor disruption to smooth gait path), deviates 6 - 10 in outside 12 in walkway width or uses assistive device Gait and Pivot Turn: Pivot turns safely in greater than 3 sec and stops with no loss of balance, or pivot turns safely within 3 sec and stops with mild imbalance, requires small steps to catch balance. Step Over Obstacle: Is able to step over one shoe box (4.5 in total height) without changing gait speed. No evidence of imbalance. Gait with Narrow Base of Support: Ambulates 4-7 steps. Gait with Eyes Closed: Walks 20 ft, uses assistive device, slower speed, mild gait deviations, deviates 6-10 in outside 12 in walkway width. Ambulates 20 ft in less than 9 sec but greater than 7 sec. Ambulating Backwards: Walks 20 ft, uses assistive device, slower speed, mild gait deviations, deviates 6-10 in outside 12 in walkway width. Steps: Alternating feet, must use rail. Total Score: 21  Therapy/Group: Individual Therapy  Janey Genta 12/22/2021, 3:53 PM

## 2021-12-22 NOTE — Progress Notes (Signed)
Patient ID: Frank Warner, male   DOB: 03/06/1980, 41 y.o.   MRN: [id number]  Team Conference Report to Patient/Family  Team Conference discussion was reviewed with the patient and caregiver, including goals, any changes in plan of care and target discharge date.  Patient and caregiver express understanding and are in agreement.  The patient has a target discharge date of 12/24/21.  SW met with patient and provided team conference updates. Patient ready for discharge on Friday. Patient uninsured will discharge home with HEP. Patient has no DME needs. No questions or concerns currently.  Dyanne Iha 12/22/2021, 1:52 PM

## 2021-12-22 NOTE — Progress Notes (Addendum)
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 No results for input(s): "WBC", "HGB", "HCT", "PLT" in the last 72 hours.  Recent Labs    12/20/21 0529  NA 138  K 4.0  CL 104  CO2 25  GLUCOSE 91  BUN 10  CREATININE 0.99  CALCIUM 9.3     Intake/Output Summary (Last 24 hours) at 12/22/2021 0759 Last data filed at 12/22/2021 0100 Gross per 24 hour  Intake 712 ml  Output 900 ml  Net -188 ml         Physical Exam: Vital Signs Blood pressure 106/65, pulse 61, temperature 98.1 F (36.7 C), resp. rate 16, height 6' (1.829 m), weight 95.7 kg, SpO2 98 %.  General: No acute distress Mood and affect are appropriate Heart: Regular rate and rhythm no rubs murmurs or extra sounds Lungs: Clear to auscultation, breathing unlabored, no rales or wheezes Abdomen: Positive bowel sounds, soft nontender to palpation, nondistended Extremities: No clubbing, cyanosis, or edema Skin: No evidence of breakdown, no evidence of rash   Strength 5/5 RUE and RLE, 4 to 4+/5 LLE and 3- Elbow flexors and extensors,trace Left grip, 0/5 left finger extensors   Assessment/Plan: 1. Functional deficits which require 3+ hours per day of interdisciplinary therapy in a comprehensive inpatient rehab setting. Physiatrist is providing close team supervision and 24 hour management of active medical problems listed below. Physiatrist and rehab team continue to assess barriers to discharge/monitor patient progress toward functional and medical goals  Care Tool:  Bathing    Body parts bathed by patient: Left arm, Chest, Abdomen, Front perineal area, Buttocks, Right upper leg, Left upper leg, Right lower leg, Left lower leg, Face   Body parts bathed by helper: Right  arm     Bathing assist Assist Level: Minimal Assistance - Patient > 75% (Min A to bathe R upper arm)     Upper Body Dressing/Undressing Upper body dressing   What is the patient wearing?: Pull over shirt    Upper body assist Assist Level: Supervision/Verbal cueing    Lower Body Dressing/Undressing Lower body dressing      What is the patient wearing?: Pants, Incontinence brief     Lower body assist Assist for lower body dressing: Contact Guard/Touching assist     Toileting Toileting    Toileting assist Assist for toileting: Contact Guard/Touching assist     Transfers Chair/bed transfer  Transfers assist     Chair/bed transfer assist level: Contact Guard/Touching assist     Locomotion Ambulation   Ambulation assist      Assist level: Supervision/Verbal cueing Assistive device: No Device Max distance: 36f   Walk 10 feet activity   Assist     Assist level: Supervision/Verbal cueing Assistive device: No Device   Walk 50 feet activity   Assist Walk 50 feet with 2 turns activity did not occur: Safety/medical concerns  Assist level: Supervision/Verbal cueing Assistive device: No Device    Walk 150 feet activity  Assist Walk 150 feet activity did not occur: Safety/medical concerns  Assist level: Supervision/Verbal cueing Assistive device: No Device    Walk 10 feet on uneven surface  activity   Assist Walk 10 feet on uneven surfaces activity did not occur: Safety/medical concerns   Assist level: Contact Guard/Touching assist Assistive device: Other (comment) (no AD)   Wheelchair     Assist Is the patient using a wheelchair?: Yes Type of Wheelchair: Manual    Wheelchair assist level: Supervision/Verbal cueing Max wheelchair distance: 150    Wheelchair 50 feet with 2 turns activity    Assist        Assist Level: Supervision/Verbal cueing   Wheelchair 150 feet activity     Assist      Assist Level:  Supervision/Verbal cueing   Blood pressure 106/65, pulse 61, temperature 98.1 F (36.7 C), resp. rate 16, height 6' (1.829 m), weight 95.7 kg, SpO2 98 %.  Medical Problem List and Plan: 1. Functional deficits secondary to R ICA and MCA stroke affecting right basal ganglia, insula, anterior temporal and frontal lobe s/p thrombectomy TICI2c             -patient may shower             -ELOS/Goals: 12/24/21,              - Monitor for development of LUE tone,  Con't CIR- PT, OT and SLP, Team conference today please see physician documentation under team conference tab, met with team  to discuss problems,progress, and goals. Formulized individual treatment plan based on medical history, underlying problem and comorbidities.  2.  Antithrombotics: -DVT/anticoagulation:  Pharmaceutical: Lovenox may d/c due to amb 300'             -antiplatelet therapy: DAPT x 3 weeks (Plavix thru 10/25) followed by ASA alone.  3. Pain Management: Tylenol prn.  4. Mood/Behavior/Sleep: LCSW to follow for evaluation and support.              -antipsychotic agents: N/A 5. Neuropsych/cognition: This patient is not capable of making decisions on his own behalf. 6. Skin/Wound Care: routine pressure relief measures.              --continue supplements TID 7. Fluids/Electrolytes/Nutrition: Monitor I/O. Check lytes in am.             --hypoK resolved off KCL supplements      Latest Ref Rng & Units 12/20/2021    5:29 AM 12/13/2021    5:46 AM 12/06/2021    5:41 AM  BMP  Glucose 70 - 99 mg/dL 91  90  85   BUN 6 - 20 mg/dL 10  8  14    Creatinine 0.61 - 1.24 mg/dL 0.99  1.01  1.22   Sodium 135 - 145 mmol/L 138  138  134   Potassium 3.5 - 5.1 mmol/L 4.0  4.2  4.0   Chloride 98 - 111 mmol/L 104  103  98   CO2 22 - 32 mmol/L 25  26  23    Calcium 8.9 - 10.3 mg/dL 9.3  9.7  10.1   Normal BMET 10/23  8. Seizures: Continue Keppra 1500 mg BID             -- GTC at home on presentation, cEEG 9/22-23 showed no epileptiform  discharges.  Probable CVA onset seizure, no driving until cleared by neuro , f/u as OP in 2 mo 9. ABLA: Resolved 10. HTN: Monitor BP TID--controlled off  meds Vitals:   12/21/21 1947 12/22/21 0522  BP: 114/81 106/65  Pulse: 66 61  Resp: 16 16  Temp: 98.1 F (36.7 C) 98.1 F (36.7 C)  SpO2: 99% 98%   Hx HTN but BPs ok will cont to monitor    11. Hyperlipidemia: LDL 162--continue Crestor 40 mg/day 12. Leucocytosis: Downtrending, 15. Monitor trend. Fevers resolving.  -Current workup: CXR neg 9/30, UA negative, Lower extremity doppler negative for DVT  -WBC stable at 15.7 10/6  -Recheck Monday, no signs or symptoms of infection 13. Constipation refusing miralax most doses        -- LBM 10/24 14. Bradycardia: continue to monitor HR TID- asymptomatic, reviewed EKG 11/19/21, essentially normal  15.  Pt reports insomnia but declines sleep aid 16.  Urinary urgency no dysuria, likely CVA related   10/24 spilled urinal     LOS: 20 days A FACE TO [place] Sabri Teal 12/22/2021, 7:59 AM

## 2021-12-22 NOTE — Progress Notes (Signed)
Physical Therapy Session Note  Patient Details  Name: Frank Warner MRN: [id number] Date of Birth: Dec 19, 1980  Today's Date: 12/22/2021 PT Individual Time: 0915-0945 PT Individual Time Calculation (min): 30 min   Short Term Goals: Week 3:  PT Short Term Goal 1 (Week 3): = to LTGs base on ELOS  Skilled Therapeutic Interventions/Progress Updates:      Pt supine in bed to start - agreeable to PT tx and denies pain. Pt unaware of his therapy scheduled - provided this for him. He also didn't touch his breakfast tray sitting on table - reports he didn't know it was there - plans to eat after morning therapy session. Asked patient if he needed to toilet prior to leaving his room - reports he just completed that and doesn't need to go.   Supine<>sitting EOB with supervision with HOB raised. Stand step transfer to w/c on his L with SBA - pt with controlled and safe transfer. Wheeled sinkside per his request for him to complete a few self care tasks - oral care, face washing, etc. Completed all without assist or cues.  Gait training during session >232ft with CGA and no AD - dynamic gait training to challenge balance and improve L sided attention - ball toss while ambulating and ball dribbling while ambulating. During task, pt reporting sudden urge to void - ambulated quickly back to his room with CGA - pt continent of bladder while standing but he did have urge incontinence with wet brief. NT notified to chart. TotalA for brief change while seated for safety.  Returned to bed and patient left sitting EOB with alarm on, setup for his breakfast.    Therapy Documentation Precautions:  Precautions Precautions: Fall Precaution Comments: L inattention, L hemiparesis, L lean, hx of seizures Restrictions Weight Bearing Restrictions: No General:      Therapy/Group: Individual Therapy  [name] 12/22/2021, 7:37 AM

## 2021-12-23 ENCOUNTER — Other Ambulatory Visit (HOSPITAL_COMMUNITY): Payer: Self-pay

## 2021-12-23 NOTE — Progress Notes (Signed)
Occupational Therapy Discharge Summary  Patient Details  Name: Frank Warner MRN: [id number] Date of Birth: 06/24/1980  Date of Discharge from [place] [facility], 2023  Today's Date: 12/23/2021 OT Individual Time: 10:45-12:00 OT Individual Time Calculation: 75 min OT Individual Time: 1300-1330 OT Individual Time Calculation (min): 30 min    Patient has met 12 of 13 long term goals due to improved activity tolerance, improved balance, postural control, ability to compensate for deficits, functional use of  LEFT upper and LEFT lower extremity, improved attention, improved awareness, and improved coordination.  Patient to discharge at overall Supervision level. Pt demostrated improved functional use of LUE with return of bicep and tricep activation, finger extension/flexion in gravity eliminated plane, and full wrist extension. Pt continues to demonstrate 2-/5 shoulder activation impacting his functional reach and ADLs. Pt has adapted well to deficits and is proficient in hemi-dressing/bathing techniques. Pt demonstrates decreased safety awareness and will benefit from supervision upon dc home. Patient's care partner is independent to provide the necessary physical and cognitive assistance at discharge.    Reasons goals not met: Pt continues to required min verbal cues for safety awareness and supervision during functional tasks d/t absence of insight to deficits. Pt is at am emergent awareness level and does not currently anticipate his needs appropriately.    Recommendation:  Patient will benefit from ongoing skilled OT services in outpatient setting to continue to advance functional skills in the area of BADL, iADL, Vocation, and Reduce care partner burden. Pt is currently uninsured, provided with information on local [facility]-bon[facility] clinics for therapy services. Equipment: Tub transfer bench  Reasons for discharge: treatment goals met and discharge from hospital  Patient/family agrees with  progress made and goals achieved: Yes  OT Discharge Session 1: Pt received resting in bed motivated to participate in skilled OT session. Pt supine to sit OEB>supervision. Pt ambulated to bathroom wihtout AD with close supervision and completed 3/3 toileting tasks. Pt doffed clothing while standing leaning against wall requiring min verbal cues for safety during task. Pt completed tub bench transfer and U/LB bathing with close supervision while seated on tub bench and standing to wash peri area and bottom. Pt ambulated back to EOB and completed U/LB dressing using hemi-dressing technique with supervision. Pt applied deodorant and brushed his teeth with close supervision while standing at the sink. Pt required moderate verbal cues to initiate use of LUE throughout session. Min verbal cues required for safety awareness and to increase awareness to L side. Pt was left resting in his wc with all bell in reach, posey belt on, and all needs met.   Session 2 : Pt received resting in wc with close friend/coworker present. Pt reporting no pain and open to participating in skilled therapy session with a focus on HEP. Pt provided with handouts for HEP including putty exercises, UE stretches, therband exercises, and fine motor coordination activities. Pt receptive to education and demonstrating good teach back of learned HEP. Pt friend stating she is planning to remind Pt to complete exercises as needed. Pt left in wc with posey belt on, call bell in reach, and all needs met.   Precautions/Restrictions  Precautions Precautions: Fall Precaution Comments: L innatention, L lean, hx of seizures, L hemiparesis Restrictions Weight Bearing Restrictions: No General   Vital Signs Therapy Vitals Temp: 98.5 F (36.9 C) Temp Source: Oral Pulse Rate: 74 Resp: 16 BP: 127/86 Patient Position (if appropriate): Sitting Oxygen Therapy SpO2: 99 % O2 Device: Room Air Pain Pain Assessment Pain  Scale:  0-10 ADL ADL Eating: Supervision/safety Where Assessed-Eating: Bed level Grooming: Supervision/safety Where Assessed-Grooming: Standing at sink Upper Body Bathing: Supervision/safety Where Assessed-Upper Body Bathing: Shower Lower Body Bathing: Supervision/safety Where Assessed-Lower Body Bathing: Shower Upper Body Dressing: Supervision/safety, Minimal cueing Where Assessed-Upper Body Dressing: Wheelchair Lower Body Dressing: Supervision/safety Where Assessed-Lower Body Dressing: Wheelchair Toileting: Supervision/safety Where Assessed-Toileting: Glass blower/designer: Close supervision Toilet Transfer Method: Counselling psychologist: Ambulance person Transfer: Close supervison Clinical cytogeneticist Method: Optometrist: Facilities manager: Distant supervision Social research officer, government Method: Heritage manager: Civil engineer, contracting without back Vision Baseline Vision/History: 0 No visual deficits Patient Visual Report: No change from baseline Vision Assessment?: No apparent visual deficits Eye Alignment: Within Functional Limits Alignment/Gaze Preference: Within Defined Limits Saccades: Within functional limits Convergence: Within functional limits Perception  Perception: Impaired Inattention/Neglect: Does not attend to left side of body (Improvement from admission; requires min verbal cues for L attention during funtional tasks and ambulation) Praxis Praxis: Impaired Praxis Impairment Details: Motor planning Cognition Cognition Overall Cognitive Status: Within Functional Limits for tasks assessed Arousal/Alertness: Awake/alert Memory: Impaired Memory Impairment: Decreased short term memory Decreased Short Term Memory: Verbal basic;Functional basic Attention: Focused;Sustained Focused Attention: Appears intact Sustained Attention: Impaired Sustained Attention Impairment: Verbal basic;Functional  basic Awareness: Impaired Awareness Impairment: Intellectual impairment;Emergent impairment Problem Solving: Impaired Problem Solving Impairment: Verbal basic;Functional basic Behaviors: Poor frustration tolerance;Impulsive Safety/Judgment: Impaired Brief Interview for Mental Status (BIMS) Repetition of Three Words (First Attempt): 3 Temporal Orientation: Year: Correct Temporal Orientation: Month: Accurate within 5 days Temporal Orientation: Day: Correct Recall: "Sock": Yes, after cueing ("something to wear") Recall: "Blue": Yes, no cue required Recall: "Bed": Yes, no cue required BIMS Summary Score: 14 Sensation Sensation Light Touch: Appears Intact Hot/Cold: Appears Intact Proprioception: Appears Intact Stereognosis: Appears Intact Additional Comments: tested in sitting Coordination Gross Motor Movements are Fluid and Coordinated: No Fine Motor Movements are Fluid and Coordinated: No Motor  Motor Motor: Hemiplegia;Abnormal postural alignment and control Motor - Discharge Observations: LUE hemiplegia with increased activation of finger felxion/extension, bicep flexion, tricep extension, and wrist extension Mobility  Bed Mobility Bed Mobility: Supine to Sit;Sit to Supine;Rolling Right;Sitting - Scoot to Edge of Bed Rolling Right: Supervision/verbal cueing Rolling Left: Supervision/Verbal cueing Right Sidelying to Sit: Supervision/Verbal cueing Supine to Sit: Supervision/Verbal cueing Sitting - Scoot to Edge of Bed: Supervision/Verbal cueing Sit to Supine: Supervision/Verbal cueing Transfers Sit to Stand: Minimal Assistance - Patient > 75% Stand to Sit: Supervision/Verbal cueing  Trunk/Postural Assessment  Cervical Assessment Cervical Assessment: Within Functional Limits Thoracic Assessment Thoracic Assessment: Within Functional Limits Lumbar Assessment Lumbar Assessment: Within Functional Limits Postural Control Postural Control: Deficits on evaluation (Improvement  from time of addmission, Pt occasionally claps to left side wihtout attention to left side) Trunk Control: impaired and delayed Righting Reactions: impaired and delayed Protective Responses: impaired and delayed  Balance Balance Balance Assessed: Yes Static Sitting Balance Static Sitting - Balance Support: Feet supported;Right upper extremity supported Static Sitting - Level of Assistance: 7: Independent Dynamic Sitting Balance Dynamic Sitting - Balance Support: Feet supported Dynamic Sitting - Level of Assistance: 5: Stand by assistance Dynamic Sitting - Balance Activities: Forward lean/weight shifting;Reaching across midline Static Standing Balance Static Standing - Balance Support: No upper extremity supported Static Standing - Level of Assistance: 5: Stand by assistance Dynamic Standing Balance Dynamic Standing - Balance Support: Bilateral upper extremity supported Dynamic Standing - Level of Assistance: 5: Stand by assistance Dynamic Standing - Balance Activities: Lateral lean/weight shifting;Forward  lean/weight shifting;Reaching for objects Extremity/Trunk Assessment RUE Assessment RUE Assessment: Within Functional Limits LUE Assessment LUE Assessment: Exceptions to [facility] Active Range of Motion (AROM) Comments: Able to demonstrate shoulder elevation, protraction with gravity eliminated. Partial bicep and tricep activation with resisitng moderate force 4/5, Full finger flexion when given maximal effort, full finger extension in gravity eliminated plane, full wrist extension. General Strength Comments: Gross grasp present in gravity eliminated palne and when lifting light objects (tissue/wash cloth), wrist extension 4/5, bicep 4/5, tricep 4/5, finger extension 3/5, finger flexion 3-/5, shoulder flexion 3-/5, IR/ER 2-/5   Janey Genta 12/23/2021, 5:02 PM

## 2021-12-23 NOTE — Progress Notes (Addendum)
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 No results for input(s): "WBC", "HGB", "HCT", "PLT" in the last 72 hours.  No results for input(s): "NA", "K", "CL", "CO2", "GLUCOSE", "BUN", "CREATININE", "CALCIUM" in the last 72 hours.   Intake/Output Summary (Last 24 hours) at 12/23/2021 0838 Last data filed at 12/23/2021 0025 Gross per 24 hour  Intake 948 ml  Output 650 ml  Net 298 ml         Physical Exam: Vital Signs Blood pressure 106/60, pulse 68, temperature 98.5 F (36.9 C), temperature source Oral, resp. rate 16, height 6' (1.829 m), weight 95.7 kg, SpO2 99 %.  General: No acute distress Mood and affect are appropriate Heart: Regular rate and rhythm no rubs murmurs or extra sounds Lungs: Clear to auscultation, breathing unlabored, no rales or wheezes Abdomen: Positive bowel sounds, soft nontender to palpation, nondistended Extremities: No clubbing, cyanosis, or edema Skin: No evidence of breakdown, no evidence of rash   Strength 5/5 RUE and RLE, 4 to 4+/5 LLE and 3- Elbow flexors and extensors,trace Left grip, 0/5 left finger extensors   Assessment/Plan: 1. Functional deficits which require 3+ hours per day of interdisciplinary therapy in a comprehensive inpatient rehab setting. Physiatrist is providing close team supervision and 24 hour management of active medical problems listed below. Physiatrist and rehab team continue to assess barriers to discharge/monitor patient progress toward functional and medical goals  Care Tool:  Bathing    Body parts bathed by patient: Left arm, Chest, Abdomen, Front perineal area, Buttocks, Right upper leg, Left upper leg, Right lower leg, Left lower leg, Face, Right arm   Body parts  bathed by helper: Right arm     Bathing assist Assist Level: Contact Guard/Touching assist     Upper Body Dressing/Undressing Upper body dressing   What is the patient wearing?: Pull over shirt    Upper body assist Assist Level: Supervision/Verbal cueing    Lower Body Dressing/Undressing Lower body dressing      What is the patient wearing?: Pants, Incontinence brief     Lower body assist Assist for lower body dressing: Contact Guard/Touching assist     Toileting Toileting    Toileting assist Assist for toileting: Contact Guard/Touching assist     Transfers Chair/bed transfer  Transfers assist     Chair/bed transfer assist level: Contact Guard/Touching assist     Locomotion Ambulation   Ambulation assist      Assist level: Supervision/Verbal cueing Assistive device: No Device Max distance: 344ft   Walk 10 feet activity   Assist     Assist level: Supervision/Verbal cueing Assistive device: No Device   Walk 50 feet activity   Assist Walk 50 feet with 2 turns activity did not occur: Safety/medical concerns  Assist level: Supervision/Verbal cueing Assistive device: No Device    Walk 150 feet activity   Assist Walk 150 feet activity did not occur: Safety/medical concerns  Assist level: Supervision/Verbal cueing Assistive device: No Device  Walk 10 feet on uneven surface  activity   Assist Walk 10 feet on uneven surfaces activity did not occur: Safety/medical concerns   Assist level: Contact Guard/Touching assist Assistive device: Other (comment) (no AD)   Wheelchair     Assist Is the patient using a wheelchair?: Yes Type of Wheelchair: Manual    Wheelchair assist level: Supervision/Verbal cueing Max wheelchair distance: 150    Wheelchair 50 feet with 2 turns activity    Assist        Assist Level: Supervision/Verbal cueing   Wheelchair 150 feet activity     Assist      Assist Level: Supervision/Verbal  cueing   Blood pressure 106/60, pulse 68, temperature 98.5 F (36.9 C), temperature source Oral, resp. rate 16, height 6' (1.829 m), weight 95.7 kg, SpO2 99 %.  Medical Problem List and Plan: 1. 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  Antithrombotics: -DVT/anticoagulation:  Pharmaceutical: Lovenox may d/c due to amb 300'             -antiplatelet therapy:now on ASA alone 3. Pain Management: Tylenol prn.  4. Mood/Behavior/Sleep: LCSW to follow for evaluation and support.              -antipsychotic agents: N/A 5. Neuropsych/cognition: This patient is not capable of making decisions on his own behalf. 6. Skin/Wound Care: routine pressure relief measures.              --continue supplements TID 7. Fluids/Electrolytes/Nutrition: Monitor I/O. Check lytes in am.             --hypoK resolved off KCL supplements      Latest Ref Rng & Units 12/20/2021    5:29 AM 12/13/2021    5:46 AM 12/06/2021    5:41 AM  BMP  Glucose 70 - 99 mg/dL 91  90  85   BUN 6 - 20 mg/dL 10  8  14    Creatinine 0.61 - 1.24 mg/dL 0.99  1.01  1.22   Sodium 135 - 145 mmol/L 138  138  134   Potassium 3.5 - 5.1 mmol/L 4.0  4.2  4.0   Chloride 98 - 111 mmol/L 104  103  98   CO2 22 - 32 mmol/L 25  26  23    Calcium 8.9 - 10.3 mg/dL 9.3  9.7  10.1   Normal BMET 10/23  8. Seizures: Continue Keppra 1500 mg BID             -- GTC at home on presentation, cEEG 9/22-23 showed no epileptiform discharges.  Probable CVA onset seizure, no driving until cleared by neuro , f/u as OP in 2 mo 9. ABLA: Resolved 10. HTN: Monitor BP TID--controlled off meds Vitals:   12/22/21 1928 12/23/21 0422  BP: 128/84 106/60  Pulse: 65 68  Resp: 16 16   Temp: 98.5 F (36.9 C) 98.5 F (36.9 C)  SpO2: 100% 99%   Hx HTN but BPs ok will cont to monitor    11. Hyperlipidemia: LDL 162--continue Crestor 40 mg/day 12. Leucocytosis: Downtrending,  15. Monitor trend. Fevers resolving.  -Current workup: CXR neg 9/30, UA negative, Lower extremity doppler negative for DVT  -WBC stable at 15.7 10/6  -Recheck Monday, no signs or symptoms of infection 13. Constipation refusing miralax most doses        -- LBM 10/24 14. Bradycardia: continue to monitor HR TID- asymptomatic, reviewed EKG 11/19/21, essentially normal  15.  Pt reports insomnia but declines sleep aid 16.  Urinary urgency no dysuria, likely CVA related   10/24 spilled urinal     LOS: 21 days A FACE TO [place] E Maudy Yonan 12/23/2021, 8:38 AM

## 2021-12-23 NOTE — Progress Notes (Signed)
Speech Language Pathology Discharge Summary  Patient Details  Name: Frank Warner MRN: [id number] Date of Birth: 1980-06-16  Date of Discharge from [place] 26, 2023  Today's Date: 12/23/2021 SLP Individual Time: 1445-1535 SLP Individual Time Calculation (min): 50 min  Skilled Therapeutic Interventions: Skilled ST treatment focused on cognitive goals. 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 Pt informed SLP that there may be an opportunity for him to return to work with accommodations and modified hours which would involve taking orders over the phone from a seated position. Friend (Frank Warner) was educated regarding pt's current cognitive status and current areas of deficit that may impact pt's success with this position including decreased sustained, alternating, and divided attention skills, awareness of deficits, judgement, short-term memory, and higher level problem solving skills. Frank Warner verbalized understanding of these deficits and stated their place of work would be more than willing to accommodate pt's needs to ensure his overall success, safety, and wellbeing, of course pending MD's clearance. SLP facilitated simulated tasks related to work responsibilities re: writing down orders and numbers in real time, and repeating back information to SLP. Pt completed these written tasks with extended processing time and sup A verbal cues. Pt appeared externally distracted due to stimuli from hallway. SLP then facilitated similar task but using [name], as Frank Warner stated he would be inputting orders into a touch screen computer. Pt selected numbers being verbally read to him in real time to achieve 100% accuracy at the mod I level by pausing and requesting SLP to repeat information x1 for clarification. Pt exhibited difficulty with short-term recall of information but was able to compensate appropriately by asking for clarification and/or  self correcting in the moment. Pt performed better using the BITS touch screen capabilities and it should be noted this task occurred within a quiet and minimally distracting environment. Pt/SLP discussed various strategies to ensure accuracy re: repeating information back to customer, double checking for accuracy. Given pt's performance on today's tasks and consideration of performance during other recent cognitive-linguistic activities, anticipate pt may be able to effectively take orders over the phone in a seated position given he is in a quiet and minimally distracting environment for limited periods of time following a prolonged training period to familiarize self with computer system. Anticipate pt may have increased difficulty completing such work in a distractible environment for prolonged periods of time considering attention and other cognitive deficits. The likely presence of cognitive fatigue may also impact pt's judgement and reduce safety awareness within a work setting without consistent supervision. Recommend further discussion with MD. Patient was passed off to PT at end of session.  Patient has met 5 of 7 long term goals.  Patient to discharge at overall Supervision level.  Reasons goals not met: Pt requiring at least min A verbal cues for day-to-day recall, at least mod A for emergent awareness   Clinical Impression/Discharge Summary: Patient has made functional gains and has met 5 of 7 long-term goals this admission. Pt is currently completing cognitive-linguistic tasks at the supervision level in regards to attention, problem solving, and use of compensatory strategies; min A for short-term recall; mod A for emergent awareness of deficits with slow progress in regards to insight into deficits. Pt demonstrated excellent progress with swallowing goals and is currently consuming regular textures and thin liquids at supervision level to monitor ongoing anterior spillage on left side due to L  facial and labial weakness.  Patient and family education is complete and patient to discharge at overall supervision level. Patient's care partner is independent to provide the necessary physical and cognitive assistance at discharge. Patient would benefit from continued SLP services in outpatient setting to maximize cognitive-linguistic function and functional independence.   Care Partner:  Caregiver Able to Provide Assistance: Yes  Type of Caregiver Assistance: Cognitive;Physical  Recommendation:  24 hour supervision/assistance;Outpatient SLP  Rationale for SLP Follow Up: Maximize cognitive function and independence;Reduce caregiver burden   Equipment: None   Reasons for discharge: Treatment goals met;Discharged from hospital   Patient/Family Agrees with Progress Made and Goals Achieved: Yes   Frank Warner T Frank Warner 12/24/2021, 7:50 AM

## 2021-12-23 NOTE — Progress Notes (Signed)
Physical Therapy Discharge Summary  Patient Details  Name: Frank Warner MRN: [id number] Date of Birth: 1980-11-24  Date of Discharge from PT service:December 23, 2021  Today's Date: 12/23/2021 PT Individual Time: 1536-1650 PT Individual Time Calculation (min): 74 min    Patient has met 9 of 9 long term goals due to improved activity tolerance, improved balance, improved postural control, increased strength, functional use of  left upper extremity and left lower extremity, improved attention, and improved coordination.  Patient to discharge at an ambulatory level Supervision.   Patient's care partner is independent to provide the necessary physical assistance at discharge.    Recommendation:  Patient will benefit from ongoing skilled PT services in outpatient setting (pt currently uninsured therefore provided information on local probono clinics) to continue to advance safe functional mobility, address ongoing impairments in dynamic balance, L inattention, gait mechanics, activity tolerance, and minimize fall risk.  Equipment: No equipment provided, none needed.   Reasons for discharge: treatment goals met and discharge from hospital  Patient/family agrees with progress made and goals achieved: Yes   Skilled Therapeutic Interventions/Progress Updates:  Pt greeted in ortho gym with handoff from SLP. Pt agreeable to therapy with no c/o pain.   Gait Training >1076f between therapy gyms and room with no AD and SPV, minimal scissoring gait, minimal unsteadiness, continued frequent cuing to increase attention to objects on L side to avoid collision.   Dynamic balance and B UE strength and coordination challenged with basketball drill involving shooting, dribbling, and rebounding. SPV required and pt unsteady at times, but no LOB.  Amb 1520fwith carrying laundry basket with B UE, assistance needed with L UE. Pt able to initate elbow flexion to bring L UE up to basket about 75%, but  assistance given by SPT for last 25%, assistance and cuing given to squeeze handle with L hand. Pt able to grasp basket between 5-10seconds with L hand before losing grasp of basket.  Boxing drill performed for dynamic balance balance and L UE strength and coordination. No LOB noted and pt showing good postural control with stepping and punching. Pt able to flex L elbow with concentration and able to punch slightly fwd and medially. Targets given both high and low for L UE punching. Pt is able to hit target 50% of the time. L UE strength and coordination are a factor in having difficulty with missing target.  Pt left seated EOB with call bell in reach and all needs met.   PT Discharge Precautions/Restrictions Precautions Precautions: Fall Precaution Comments: L inattention, hx of seizures, L hemiparesis Restrictions Weight Bearing Restrictions: No Pain Pain Assessment Pain Scale: 0-10 Pain Interference Pain Interference Pain Effect on Sleep: 1. Rarely or not at all Pain Interference with Therapy Activities: 1. Rarely or not at all Pain Interference with Day-to-Day Activities: 1. Rarely or not at all Vision/Perception  Vision - History Ability to See in Adequate Light: 0 Adequate Vision - Assessment Eye Alignment: Within Functional Limits Alignment/Gaze Preference: Within Defined Limits Perception Inattention/Neglect: Does not attend to left side of body (improve from initial eval) Praxis Praxis: intact Cognition Overall Cognitive Status: Within Functional Limits for tasks assessed Arousal/Alertness: Awake/alert Orientation Level: Oriented X4 Memory: Appears intact Safety/Judgment: Impaired Comments: Decreased awareness of L inattention and believe he can go back to work immediately. Sensation Sensation Light Touch: Appears Intact Hot/Cold: Not tested Proprioception: Impaired by gross assessment Stereognosis: Not tested Additional Comments: tested in sitting, L inattention  due to not being aware of where  L UE is in space Coordination Gross Motor Movements are Fluid and Coordinated: Yes Fine Motor Movements are Fluid and Coordinated: Not tested Coordination and Movement Description: Assessment pertains to LEs Motor  Motor Motor: Hemiplegia Motor - Skilled Clinical Observations: L hemiplegia UE>LE Motor - Discharge Observations: L hemiplegia UE>LE  Mobility Bed Mobility Bed Mobility: Supine to Sit;Sit to Supine;Rolling Right;Sitting - Scoot to Edge of Bed Rolling Right: Independent with assistive device Rolling Left: Independent with assistive device Right Sidelying to Sit: Independent with assistive device Supine to Sit: Independent with assistive device Sitting - Scoot to Edge of Bed: Independent with assistive device Sit to Supine: Independent with assistive device Transfers Transfers: Sit to Stand;Stand to Sit Sit to Stand: Supervision/Verbal cueing Stand to Sit: Supervision/Verbal cueing Stand Pivot Transfers: Supervision/Verbal cueing Transfer (Assistive device): None Locomotion  Gait Ambulation: Yes Gait Assistance: Supervision/Verbal cueing Gait Distance (Feet): 1000 Feet Assistive device: None Gait Assistance Details: Visual cues/gestures for precautions/safety;Verbal cues for precautions/safety Gait Assistance Details: Cues for increased attention to L side of environment to avoid running into obstacles. Gait Gait: Yes Gait Pattern: Impaired Gait Pattern: Narrow base of support (occasional scissoring gait) Gait velocity: decreased Stairs / Additional Locomotion Stairs: Yes Stairs Assistance: Supervision/Verbal cueing Stair Management Technique: One rail Left;One rail Right (using right rail while ascending and left rail while descending with R UE) Number of Stairs: 12 Height of Stairs: 6 Ramp: Supervision/Verbal cueing Curb: Contact Guard/Touching assist Pick up small object from the floor assist level: Supervision/Verbal  cueing Wheelchair Mobility Wheelchair Mobility: No  Trunk/Postural Assessment  Cervical Assessment Cervical Assessment: Within Functional Limits Thoracic Assessment Thoracic Assessment: Within Functional Limits Lumbar Assessment Lumbar Assessment: Within Functional Limits Postural Control Postural Control: Within Functional Limits Balance Balance Balance Assessed: Yes Standardized Balance Assessment Standardized Balance Assessment: Functional Gait Assessment Static Sitting Balance Static Sitting - Balance Support: Feet supported;No upper extremity supported Static Sitting - Level of Assistance: 6: Modified independent (Device/Increase time) Dynamic Sitting Balance Dynamic Sitting - Balance Support: Feet supported Dynamic Sitting - Level of Assistance: 6: Modified independent (Device/Increase time) Dynamic Sitting - Balance Activities: Forward lean/weight shifting;Reaching across midline Static Standing Balance Static Standing - Balance Support: No upper extremity supported;During functional activity Static Standing - Level of Assistance: 5: Stand by assistance Dynamic Standing Balance Dynamic Standing - Balance Support: No upper extremity supported;During functional activity Dynamic Standing - Level of Assistance: 5: Stand by assistance Dynamic Standing - Balance Activities: Lateral lean/weight shifting;Forward lean/weight shifting;Reaching for objects Functional Gait  Assessment Gait assessed : Yes Gait Level Surface: Walks 20 ft in less than 5.5 sec, no assistive devices, good speed, no evidence for imbalance, normal gait pattern, deviates no more than 6 in outside of the 12 in walkway width. Change in Gait Speed: Able to smoothly change walking speed without loss of balance or gait deviation. Deviate no more than 6 in outside of the 12 in walkway width. Gait with Horizontal Head Turns: Performs head turns smoothly with slight change in gait velocity (eg, minor disruption to  smooth gait path), deviates 6-10 in outside 12 in walkway width, or uses an assistive device. Gait with Vertical Head Turns: Performs task with slight change in gait velocity (eg, minor disruption to smooth gait path), deviates 6 - 10 in outside 12 in walkway width or uses assistive device Gait and Pivot Turn: Pivot turns safely in greater than 3 sec and stops with no loss of balance, or pivot turns safely within 3 sec and stops with mild  imbalance, requires small steps to catch balance. Step Over Obstacle: Is able to step over one shoe box (4.5 in total height) without changing gait speed. No evidence of imbalance. Gait with Narrow Base of Support: Ambulates 4-7 steps. Gait with Eyes Closed: Walks 20 ft, uses assistive device, slower speed, mild gait deviations, deviates 6-10 in outside 12 in walkway width. Ambulates 20 ft in less than 9 sec but greater than 7 sec. Ambulating Backwards: Walks 20 ft, uses assistive device, slower speed, mild gait deviations, deviates 6-10 in outside 12 in walkway width. Steps: Alternating feet, must use rail. Total Score: 21 Extremity Assessment      RLE Assessment RLE Assessment: Within Functional Limits General Strength Comments: assessed grossly in sitting LLE Assessment LLE Assessment: Exceptions to [facility] General Strength Comments: tested grossly in sitting LLE Strength Left Hip Flexion: 4+/5 Left Knee Flexion: 4/5 Left Knee Extension: 5/5 Left Ankle Dorsiflexion: 5/5 Left Ankle Plantar Flexion: 4-/5   Kayleen Memos, SPT  12/23/2021, 7:40 AM

## 2021-12-23 NOTE — Progress Notes (Addendum)
Patient ID: Frank Warner, male   DOB: 07-Jul-1980, 41 y.o.   MRN: [id number]  Sw received phone call from patient mother, [name]. She reports that patient does not have a bed for discharge tomorrow and is concerned about what to do at this point. Sw will follow up with team about potentially arranging a HB with patient. No questions or concerns, sw will follow up with patient mother.   Hospital Bed ordered through [facility].

## 2021-12-23 NOTE — Progress Notes (Signed)
Patient ID: Frank Warner, male   DOB: 01-15-1981, 41 y.o.   MRN: [id number]  Patient [place] entered in system.  Effective: 12/23/2021 Term: 12/30/2021

## 2021-12-23 NOTE — TOC Transition Note (Signed)
Discharge medications (5) are being stored in the Transitions of Care (TOC) Pharmacy on the second floor until patient is ready for discharge.    

## 2021-12-23 NOTE — Progress Notes (Signed)
Inpatient Rehabilitation Care Coordinator Discharge Note   Patient Details  Name: Frank Warner MRN: [id number] Date of Birth: December 28, 1980   Discharge location: Mother's Home  Length of Stay: 22 Days  Discharge activity level: Sup,CGA  Home/community participation: Mother, friends and family  Patient response HY:IFOYDX Literacy - How often do you need to have someone help you when you read instructions, pamphlets, or other written material from your doctor or pharmacy?: Often  Patient response AJ:OINOMV Isolation - How often do you feel lonely or isolated from those around you?: Never  Services provided included: MD, RD, PT, OT, SLP, RN, CM, TR, Pharmacy, Neuropsych, SW  Financial Services:  Charity fundraiser Utilized: Other (Comment) (uninsured)    Choices offered to/list presented to:    Follow-up services arranged:  Other (Comment)           Patient response to transportation need: Is the patient able to respond to transportation needs?: Yes In the past 12 months, has lack of transportation kept you from medical appointments or from getting medications?: No In the past 12 months, has lack of transportation kept you from meetings, work, or from getting things needed for daily living?: No    Comments (or additional information):  Patient/Family verbalized understanding of follow-up arrangements:  Yes  Individual responsible for coordination of the follow-up plan: Mother [phone]  Confirmed correct DME delivered: Dyanne Iha 12/23/2021    Dyanne Iha

## 2021-12-24 ENCOUNTER — Other Ambulatory Visit (HOSPITAL_COMMUNITY): Payer: Self-pay

## 2021-12-24 NOTE — Progress Notes (Signed)
Patient ID: Frank Warner, male   DOB: [birth year], 41 y.o.   MRN: [id number]  Patient reports friend can pick him up for discharge today. Friend will sit with patient at home while mom sits with patient in ED. Patient and friend will wait for HB at home.

## 2021-12-24 NOTE — Progress Notes (Signed)
Inpatient Rehabilitation Discharge Medication Review by a Pharmacist  A complete drug regimen review was completed for this patient to identify any potential clinically significant medication issues.  High Risk Drug Classes Is patient taking? Indication by Medication  Antipsychotic No   Anticoagulant No   Antibiotic No   Opioid No   Antiplatelet Yes Aspirin for stroke prophylaxis  Hypoglycemics/insulin No   Vasoactive Medication No   Chemotherapy No   Other Yes Pain: Tylenol prn Seizure: Keppra Vitamin/supplement: MVI GERD: Protonix Hyperlipidemia: Crestor     Type of Medication Issue Identified Description of Issue Recommendation(s)  Drug Interaction(s) (clinically significant)     Duplicate Therapy     Allergy     No Medication Administration End Date     Incorrect Dose     Additional Drug Therapy Needed     Significant med changes from prior encounter (inform family/care partners about these prior to discharge).    Other       Clinically significant medication issues were identified that warrant physician communication and completion of prescribed/recommended actions by midnight of the next day:  No   Time spent performing this drug regimen review (minutes):  30   Thank you for allowing [place] to participate in this patients care. Jens Som, PharmD 12/24/2021 9:52 AM  **Pharmacist phone directory can be found on [email or web address] listed under [place]**

## 2021-12-24 NOTE — Discharge Summary (Signed)
Physician Discharge Summary  Patient ID: Frank Warner MRN: [id number] DOB/AGE: 1981-01-12 41 y.o.  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 RADIATION DOSE REDUCTION: This exam was performed according to the departmental dose-optimization program which includes automated exposure control, adjustment of the mA and/or kV according to patient size and/or use of iterative reconstruction technique. COMPARISON:  11/27/2021 FINDINGS: Brain: Right MCA distribution infarct affecting the insula and frontal cortex and the striatum with intervening white matter. Petechial intraventricular hemorrhage has subsided. Local swelling appears unchanged. No new abnormality. No hydrocephalus. Vascular: Prominent density of right MCA vessels could be from ictus or related to adjacent edematous brain. Skull: Normal. Negative for fracture or focal lesion. Sinuses/Orbits: Negative IMPRESSION: Recent right MCA distribution infarct with regressing hemorrhage. No evidence of progression. Electronically Signed   By: Jorje Guild M.D.   On: 12/01/2021 11:25   DG CHEST PORT 1 VIEW  Result Date: 11/30/2021 CLINICAL DATA:  Cough. EXAM: PORTABLE CHEST 1 VIEW COMPARISON:  Chest x-ray 11/27/2021 FINDINGS: Right upper extremity PICC terminates over the distal SVC. Lung volumes are low likely accentuating central pulmonary vascularity. The heart size and mediastinal contours are  within normal limits. Both lungs are clear. The visualized skeletal structures are unremarkable. IMPRESSION: No active disease. Electronically Signed   By: Ronney Asters M.D.   On: 11/30/2021 19:20   VAS [place] LOWER EXTREMITY VENOUS (DVT)  Result Date: 11/29/2021  Lower Venous DVT Study Patient Name:  Frank Warner  Date of Exam:   11/29/2021 Medical Rec #: [id number]     Accession #:    [id number] Date of Birth: Dec 11, 1980      Patient Gender: M Patient Age:   35 years Exam Location:  [facility] Procedure:      VAS [place] LOWER EXTREMITY VENOUS (DVT) Referring Phys: Elwin Sleight DE LA TORRE --------------------------------------------------------------------------------  Indications: Ongoing leukocytosis per MD.  Risk Factors: Recent CVA. Comparison Study: Prior negative LEV done 11/20/21 Performing Technologist: Sharion Dove RVS  Examination Guidelines: A complete evaluation includes B-mode imaging, spectral Doppler, color Doppler, and power Doppler as needed of all accessible portions of each vessel. Bilateral testing is considered an integral part of a complete examination. Limited examinations for reoccurring indications may be performed as noted. The reflux portion of the exam is performed with the patient in reverse Trendelenburg.  +---------+---------------+---------+-----------+----------+--------------+ RIGHT    CompressibilityPhasicitySpontaneityPropertiesThrombus Aging +---------+---------------+---------+-----------+----------+--------------+ CFV      Full           Yes      Yes                                 +---------+---------------+---------+-----------+----------+--------------+ SFJ      Full                                                        +---------+---------------+---------+-----------+----------+--------------+  FV Prox  Full                                                        +---------+---------------+---------+-----------+----------+--------------+ FV Mid    Full                                                        +---------+---------------+---------+-----------+----------+--------------+ FV DistalFull                                                        +---------+---------------+---------+-----------+----------+--------------+ PFV      Full                                                        +---------+---------------+---------+-----------+----------+--------------+ POP      Full           Yes      Yes                                 +---------+---------------+---------+-----------+----------+--------------+ PTV      Full                                                        +---------+---------------+---------+-----------+----------+--------------+ PERO     Full                                                        +---------+---------------+---------+-----------+----------+--------------+ Gastroc  Full                                                        +---------+---------------+---------+-----------+----------+--------------+   +---------+---------------+---------+-----------+----------+--------------+ LEFT     CompressibilityPhasicitySpontaneityPropertiesThrombus Aging +---------+---------------+---------+-----------+----------+--------------+ CFV      Full           Yes      Yes                                 +---------+---------------+---------+-----------+----------+--------------+ SFJ      Full                                                        +---------+---------------+---------+-----------+----------+--------------+  FV Prox  Full                                                        +---------+---------------+---------+-----------+----------+--------------+ FV Mid   Full                                                        +---------+---------------+---------+-----------+----------+--------------+ FV DistalFull                                                         +---------+---------------+---------+-----------+----------+--------------+ PFV      Full                                                        +---------+---------------+---------+-----------+----------+--------------+ POP      Full           Yes      Yes                                 +---------+---------------+---------+-----------+----------+--------------+ PTV      Full                                                        +---------+---------------+---------+-----------+----------+--------------+ PERO     Full                                                        +---------+---------------+---------+-----------+----------+--------------+     Summary: BILATERAL: - No evidence of deep vein thrombosis seen in the lower extremities, bilaterally. -No evidence of popliteal cyst, bilaterally. RIGHT: - Findings appear essentially unchanged compared to previous examination.  LEFT: - Findings appear essentially unchanged compared to previous examination.  *See table(s) above for measurements and observations. Electronically signed by Lemar Livings MD on 11/29/2021 at 7:50:20 PM.    Final    ECHO TEE  Result Date: 11/27/2021    TRANSESOPHOGEAL ECHO REPORT   Patient [facility]:   [facility] Date of Exam: 11/26/2021 Medical Rec #:  [id number]    Height:       72.0 in Accession #:    [id number]   Weight:       220.2 lb Date of Birth:  Jul 03, 1980     BSA:          2.220 m Patient Age:    41 years     BP:           130/73 mmHg  Patient Gender: M            HR:           102 bpm. Exam Location:  Inpatient Procedure: 2D Echo, Cardiac Doppler, Color Doppler and Saline Contrast Bubble            Study Indications:     Stroke  History:         Patient has prior history of Echocardiogram examinations. Risk                  Factors:Current Smoker. CVA. Acute respiratory failure with                  hypoxia.  Sonographer:     Ross Ludwig RDCS (AE) Referring Phys:  [id number] Roe Rutherford [facility]  Diagnosing Phys: Jodelle Red MD PROCEDURE: After discussion of the risks and benefits of a TEE, an informed consent was obtained from a family member. The transesophogeal probe was passed without difficulty through the esophogus of the patient. Local oropharyngeal anesthetic was provided with Cetacaine. Sedation performed by different physician. The patient was monitored while under deep sedation. Anesthestetic sedation was provided intravenously by Anesthesiology: 263.72mg  of Propofol. Image quality was good. The patient developed no complications during the procedure. IMPRESSIONS  1. Left ventricular ejection fraction, by estimation, is 55 to 60%. The left ventricle has normal function.  2. Right ventricular systolic function is normal. The right ventricular size is normal.  3. No left atrial/left atrial appendage thrombus was detected.  4. The mitral valve is normal in structure. No evidence of mitral valve regurgitation. No evidence of mitral stenosis.  5. The aortic valve is tricuspid. Aortic valve regurgitation is not visualized. No aortic stenosis is present.  6. Agitated saline contrast bubble study was positive with shunting observed after >6 cardiac cycles suggestive of intrapulmonary shunting. Conclusion(s)/Recommendation(s): No intracardiac source of embolism. No evidence of intra-atrial shunt. Late positive bubble suggests non-cardiac shunt. FINDINGS  Left Ventricle: Left ventricular ejection fraction, by estimation, is 55 to 60%. The left ventricle has normal function. The left ventricular internal cavity size was normal in size. Right Ventricle: The right ventricular size is normal. No increase in right ventricular wall thickness. Right ventricular systolic function is normal. Left Atrium: Left atrial size was normal in size. No left atrial/left atrial appendage thrombus was detected. Right Atrium: Right atrial size was normal in size. Pericardium: There is no evidence of pericardial  effusion. Mitral Valve: The mitral valve is normal in structure. No evidence of mitral valve regurgitation. No evidence of mitral valve stenosis. There is no evidence of mitral valve vegetation. Tricuspid Valve: The tricuspid valve is normal in structure. Tricuspid valve regurgitation is trivial. No evidence of tricuspid stenosis. There is no evidence of tricuspid valve vegetation. Aortic Valve: The aortic valve is tricuspid. Aortic valve regurgitation is not visualized. No aortic stenosis is present. There is no evidence of aortic valve vegetation. Pulmonic Valve: The pulmonic valve was grossly normal. Pulmonic valve regurgitation is not visualized. No evidence of pulmonic stenosis. There is no evidence of pulmonic valve vegetation. Aorta: The aortic root and ascending aorta are structurally normal, with no evidence of dilitation. IAS/Shunts: No atrial level shunt detected by color flow Doppler. Agitated saline contrast was given intravenously to evaluate for intracardiac shunting. Agitated saline contrast bubble study was positive with shunting observed after >6 cardiac cycles suggestive of intrapulmonary shunting. Jodelle Red MD Electronically signed by Jodelle Red MD Signature Date/Time: 11/27/2021/1:46:49 PM  Final    DG CHEST PORT 1 VIEW  Result Date: 11/27/2021 CLINICAL DATA:  Leukocytosis EXAM: PORTABLE CHEST 1 VIEW COMPARISON:  11/22/2021 FINDINGS: There is interval removal of endotracheal tube and enteric tube. Tip of PICC line is seen at the junction of superior vena cava and right atrium. There is poor inspiration. Transverse diameter of heart is increased. There are no signs of alveolar pulmonary edema or focal pulmonary consolidation. There is improvement in the aeration of left lower lung field. IMPRESSION: Poor inspiration. There are no signs of pulmonary edema or focal pulmonary consolidation. Electronically Signed   By: Ernie Avena M.D.   On: 11/27/2021 13:22   CT  HEAD WO CONTRAST ( )  Result Date: 11/27/2021 CLINICAL DATA:  Seizure and stroke EXAM: CT HEAD WITHOUT CONTRAST TECHNIQUE: Contiguous axial images were obtained from the base of the skull through the vertex without intravenous contrast. RADIATION DOSE REDUCTION: This exam was performed according to the departmental dose-optimization program which includes automated exposure control, adjustment of the mA and/or kV according to patient size and/or use of iterative reconstruction technique. COMPARISON:  11/23/2021 FINDINGS: Brain: Right MCA distribution infarct with hemorrhage at the basal ganglia and frontal horn of the right lateral ventricle, non progressed. Local swelling appears similar to prior. No hydrocephalus. Vascular: High-density vessels along the surface of the infarcted right frontal lobe. No interval change Skull: Unremarkable Sinuses/Orbits: Negative IMPRESSION: Recent right MCA territory infarct without progression of hemorrhage. No evidence of interval infarction. Local intraventricular clot without hydrocephalus. Electronically Signed   By: Tiburcio Pea M.D.   On: 11/27/2021 06:33    Labs:  Basic Metabolic Panel:    Latest Ref Rng & Units 12/20/2021    5:29 AM 12/13/2021    5:46 AM 12/06/2021    5:41 AM  BMP  Glucose 70 - 99 mg/dL 91  90  85   BUN 6 - 20 mg/dL 10  8  14    Creatinine 0.61 - 1.24 mg/dL  3.57  0.17   Sodium 135 - 145 mmol/L 138  138  134   Potassium 3.5 - 5.1 mmol/L 4.0  4.2  4.0   Chloride 98 - 111 mmol/L 104  103  98   CO2 22 - 32 mmol/L 25  26  23    Calcium 8.9 - 10.3 mg/dL 9.3  9.7  7.93      CBC:    Latest Ref Rng & Units 12/13/2021    5:46 AM 12/06/2021    5:41 AM 12/03/2021    4:30 AM  CBC  WBC 4.0 - 10.5 K/uL 8.2  11.4  15.7   Hemoglobin 13.0 - 17.0 g/dL 02/05/2022  02/02/2022  00.9   Hematocrit 39.0 - 52.0 % 42.2  42.1  42.1   Platelets 150 - 400 K/uL 428  532  446      CBG: No results for input(s): "GLUCAP" in the last 168 hours.  Brief HPI:    Oron Westrup is a 41 y.o. male ***   Hospital Course: Raymondo Garcialopez was admitted to rehab 12/02/2021 for inpatient therapies to consist of PT, ST and OT at least three hours five days a week. Past admission physiatrist, therapy team and rehab RN have worked together to provide customized collaborative inpatient rehab.   Blood pressures were monitored on TID basis and   Diabetes has been monitored with ac/hs CBG checks and SSI was use prn for tighter BS control.  He has made great gains but continues to  be limited by left inattention, cognitive deficits and decreased awareness of deficits. Supervision is recommended for safety.   Rehab course: During patient's stay in rehab weekly team conferences were held to monitor patient's progress, set goals and discuss barriers to discharge. At admission, patient required  He  has had improvement in activity tolerance, balance, postural control as well as ability to compensate for deficits. He/She has had improvement in functional use LUE  and LLE as well as  slow improvement in awareness of deficits. He is able to compelete ADL tasks with supervision. He requires supervision with verbal cues for transfers and to ambulate 1000' without AD. He requires min verbal cues for day to day recall, moderate assistance for emergent awareness due to slow progress in awareness and supervision with meals. Family education was completed with friend    Disposition:  There are no questions and answers to display.         Diet:  Special Instructions:  Discharge Instructions     Ambulatory referral to Neurology   Complete by: As directed    An appointment is requested in approximately: 2 weeks post discharge ELOS 12/24/21   Ambulatory referral to Physical Medicine Rehab   Complete by: As directed       Allergies as of 12/24/2021   No Known Allergies      Medication List     STOP taking these medications    clopidogrel 75 MG tablet Commonly known  as: PLAVIX   docusate sodium 100 MG capsule Commonly known as: COLACE   ondansetron 4 MG/2ML Soln injection Commonly known as: ZOFRAN   polyethylene glycol 17 g packet Commonly known as: MIRALAX / GLYCOLAX   senna-docusate 8.6-50 MG tablet Commonly known as: Senokot-S       TAKE these medications    acetaminophen 325 MG tablet Commonly known as: TYLENOL Take 1-2 tablets (325-650 mg total) by mouth every 4 (four) hours as needed for mild pain.   Aspirin Low Dose 81 MG tablet Generic drug: aspirin EC Take 1 tablet (81 mg total) by mouth daily. Swallow whole.   CertaVite/Antioxidants Tabs Take 1 tablet by mouth daily.   levETIRAcetam 750 MG tablet Commonly known as: KEPPRA Take 2 tablets (1,500 mg total) by mouth 2 (two) times daily.   pantoprazole 40 MG tablet Commonly known as: PROTONIX Take 1 tablet (40 mg total) by mouth daily.   rosuvastatin 40 MG tablet Commonly known as: CRESTOR Take 1 tablet (40 mg total) by mouth daily.        Follow-up Information     [facility] Follow up.   Why: office will call you with follow up appointment Contact information: [address]     [address] [place] [phone]        Erick ColaceKirsteins, Andrew E, MD Follow up.   Specialty: Physical Medicine and Rehabilitation Why: office will call you with follow up appointment Contact information: [address] [place] [address] [phone]         Loyola Mastudd, Stephen M, MD Follow up on 12/27/2021.   Specialty: Family Medicine Why: Be there at 12:40 for 1 pm appointment. This will be your primary care providerf for medical issues/medication refills. Contact information: [address] [place] [address] [phone]                 Signed: Jacquelynn Creeamela S Tywanda Rice 12/24/2021, 11:11 AM

## 2021-12-24 NOTE — Progress Notes (Signed)
Patient's pulse is ranging between 50-59 this morning. All vitals are WNL. Patient has a history of dropping below 60 according to previous charting. Patient remains stable and alert at this time.

## 2021-12-24 NOTE — Progress Notes (Signed)
Patient ID: Frank Warner, male   DOB: Feb 02, 1981, 41 y.o.   MRN: [id number]  New Patient Visit with Haydee Salter, MD Monday December 27, 2021 Arrive by 12:45 PM   [facility] [place] [address] [phone]  Please arrive 20 minutes prior to your appointment to allow time for [place] to update your demographics, insurance, process your copay, and update necessary documentation. If you do not arrive on time, your appointment may need to be rescheduled. Please bring all prescription medication bottles with you to your first visit to ensure we have the most accurate information in your chart. If you are not able to keep your appointment, please notify our office 24 hours in advance to avoid a $50 late cancellation or no show fee. Our office phone number is [phone].

## 2021-12-24 NOTE — Plan of Care (Signed)
  Problem: RH Memory Goal: LTG Patient will demonstrate ability for day to day (SLP) Description: LTG:   Patient will demonstrate ability for day to day recall/carryover during cognitive/linguistic activities with assist  (SLP) Outcome: Not Met (add Reason)   Problem: RH Awareness Goal: LTG: Patient will demonstrate awareness during functional activites type of (SLP) Description: LTG: Patient will demonstrate awareness during functional activites type of (SLP) Outcome: Not Met (add Reason)   Problem: RH Swallowing Goal: LTG Patient will consume least restrictive diet using compensatory strategies with assistance (SLP) Description: LTG:  Patient will consume least restrictive diet using compensatory strategies with assistance (SLP) Outcome: Completed/Met Goal: LTG Pt will demonstrate functional change in swallow as evidenced by bedside/clinical objective assessment (SLP) Description: LTG: Patient will demonstrate functional change in swallow as evidenced by bedside/clinical objective assessment (SLP) Outcome: Completed/Met   Problem: RH Problem Solving Goal: LTG Patient will demonstrate problem solving for (SLP) Description: LTG:  Patient will demonstrate problem solving for basic/complex daily situations with cues  (SLP) Outcome: Completed/Met   Problem: RH Memory Goal: LTG Patient will use memory compensatory aids to (SLP) Description: LTG:  Patient will use memory compensatory aids to recall biographical/new, daily complex information with cues (SLP) Outcome: Completed/Met   Problem: RH Attention Goal: LTG Patient will demonstrate this level of attention during functional activites (SLP) Description: LTG:  Patient will will demonstrate this level of attention during functional activites (SLP) Outcome: Completed/Met

## 2021-12-24 NOTE — Progress Notes (Signed)
PROGRESS NOTE   Subjective/Complaints:  No new issues overnight.  Appreciate speech therapy note, discussed with speech therapy.  Patient was stimulated with work activities, taking orders via phone.  He did well with this.  Some concerns about fatigue and sustained attention.  Patient supervisor happened to be there during the assessment and felt comfortable allowing him 4-hour days.  We discussed that this would be appropriate. No driving after discharge patient is aware   ROS- neg CP, SOB, N/V/D Objective:   No results found. No results for input(s): "WBC", "HGB", "HCT", "PLT" in the last 72 hours.  No results for input(s): "NA", "K", "CL", "CO2", "GLUCOSE", "BUN", "CREATININE", "CALCIUM" in the last 72 hours.   Intake/Output Summary (Last 24 hours) at 12/24/2021 0932 Last data filed at 12/24/2021 0500 Gross per 24 hour  Intake 120 ml  Output 300 ml  Net -180 ml         Physical Exam: Vital Signs Blood pressure 106/64, pulse (!) 50, temperature 97.9 F (36.6 C), temperature source Oral, resp. rate 16, height 6' (1.829 m), weight 95.7 kg, SpO2 98 %.  General: No acute distress Mood and affect are appropriate Heart: Regular rate and rhythm no rubs murmurs or extra sounds Lungs: Clear to auscultation, breathing unlabored, no rales or wheezes Abdomen: Positive bowel sounds, soft nontender to palpation, nondistended Extremities: No clubbing, cyanosis, or edema Skin: No evidence of breakdown, no evidence of rash   Strength 5/5 RUE and RLE, 4 to 4+/5 LLE and 3- Elbow flexors and extensors,trace Left grip, 0/5 left finger extensors   Assessment/Plan: 1. Functional deficits due to right MCA infarct Stable for D/C today F/u PCP in 3-4 weeks F/u PM&R 2 weeks See D/C summary See D/C instructions   Care Tool:  Bathing    Body parts bathed by patient: Left arm, Chest, Abdomen, Front perineal area, Buttocks, Right  upper leg, Left upper leg, Right lower leg, Left lower leg, Face, Right arm   Body parts bathed by helper: Right arm     Bathing assist Assist Level: Supervision/Verbal cueing     Upper Body Dressing/Undressing Upper body dressing   What is the patient wearing?: Pull over shirt    Upper body assist Assist Level: Supervision/Verbal cueing    Lower Body Dressing/Undressing Lower body dressing      What is the patient wearing?: Pants, Incontinence brief     Lower body assist Assist for lower body dressing: Supervision/Verbal cueing     Toileting Toileting    Toileting assist Assist for toileting: Supervision/Verbal cueing     Transfers Chair/bed transfer  Transfers assist     Chair/bed transfer assist level: Supervision/Verbal cueing     Locomotion Ambulation   Ambulation assist      Assist level: Supervision/Verbal cueing Assistive device: No Device Max distance: 1,068ft   Walk 10 feet activity   Assist     Assist level: Supervision/Verbal cueing Assistive device: No Device   Walk 50 feet activity   Assist Walk 50 feet with 2 turns activity did not occur: Safety/medical concerns  Assist level: Supervision/Verbal cueing Assistive device: No Device    Walk 150 feet activity   Assist  Walk 150 feet activity did not occur: Safety/medical concerns  Assist level: Supervision/Verbal cueing Assistive device: No Device    Walk 10 feet on uneven surface  activity   Assist Walk 10 feet on uneven surfaces activity did not occur: Safety/medical concerns   Assist level: Contact Guard/Touching assist Assistive device: Other (comment) (no AD)   Wheelchair     Assist Is the patient using a wheelchair?: No Type of Wheelchair: Manual    Wheelchair assist level: Supervision/Verbal cueing Max wheelchair distance: 150    Wheelchair 50 feet with 2 turns activity    Assist        Assist Level: Supervision/Verbal cueing   Wheelchair  150 feet activity     Assist      Assist Level: Supervision/Verbal cueing   Blood pressure 106/64, pulse (!) 50, temperature 97.9 F (36.6 C), temperature source Oral, resp. rate 16, height 6' (1.829 m), weight 95.7 kg, SpO2 98 %.  Medical Problem List and Plan: 1. Functional deficits secondary to R ICA and MCA stroke affecting right basal ganglia, insula, anterior temporal and frontal lobe s/p thrombectomy TICI2c             -patient may shower             -ELOS/Goals: 12/24/21,            2.  Antithrombotics: -DVT/anticoagulation:  Pharmaceutical: Lovenox may d/c due to amb 300'             -antiplatelet therapy:now on ASA alone 3. Pain Management: Tylenol prn.  4. Mood/Behavior/Sleep: LCSW to follow for evaluation and support.              -antipsychotic agents: N/A 5. Neuropsych/cognition: This patient is not capable of making decisions on his own behalf. 6. Skin/Wound Care: routine pressure relief measures.              --continue supplements TID 7. Fluids/Electrolytes/Nutrition: Monitor I/O. Check lytes in am.             --hypoK resolved off KCL supplements      Latest Ref Rng & Units 12/20/2021    5:29 AM 12/13/2021    5:46 AM 12/06/2021    5:41 AM  BMP  Glucose 70 - 99 mg/dL 91  90  85   BUN 6 - 20 mg/dL 10  8  14    Creatinine 0.61 - 1.24 mg/dL 0.99  1.01  1.22   Sodium 135 - 145 mmol/L 138  138  134   Potassium 3.5 - 5.1 mmol/L 4.0  4.2  4.0   Chloride 98 - 111 mmol/L 104  103  98   CO2 22 - 32 mmol/L 25  26  23    Calcium 8.9 - 10.3 mg/dL 9.3  9.7  10.1   Normal BMET 10/23  8. Seizures: Continue Keppra 1500 mg BID             -- GTC at home on presentation, cEEG 9/22-23 showed no epileptiform discharges.  Probable CVA onset seizure, no driving until cleared by neuro , f/u as OP in 2 mo 9. ABLA: Resolved 10. HTN: Monitor BP TID--controlled off meds Vitals:   12/23/21 1920 12/24/21 0507  BP: 116/73 106/64  Pulse: (!) 59 (!) 50  Resp: 18 16  Temp: 97.8 F  (36.6 C) 97.9 F (36.6 C)  SpO2: 100% 98%   Hx HTN but BPs ok will cont to monitor    11. Hyperlipidemia: LDL 162--continue Crestor  40 mg/day 12. Leucocytosis: Downtrending, 15. Monitor trend. Fevers resolving.  -Current workup: CXR neg 9/30, UA negative, Lower extremity doppler negative for DVT  -WBC stable at 15.7 10/6  -Recheck Monday, no signs or symptoms of infection 13. Constipation refusing miralax most doses        -- LBM 10/24 14. Bradycardia: continue to monitor HR TID- asymptomatic, reviewed EKG 11/19/21, essentially normal  15.  Pt reports insomnia but declines sleep aid 16.  Urinary urgency no dysuria, likely CVA related   10/24 spilled urinal     LOS: 22 days A FACE TO FACE EVALUATION WAS PERFORMED  Erick Colace 12/24/2021, 9:32 AM

## 2021-12-27 ENCOUNTER — Ambulatory Visit (INDEPENDENT_AMBULATORY_CARE_PROVIDER_SITE_OTHER): Payer: Self-pay | Admitting: Family Medicine

## 2021-12-27 ENCOUNTER — Encounter: Payer: Self-pay | Admitting: Family Medicine

## 2021-12-27 VITALS — BP 120/74 | HR 76 | Temp 97.5°F | Ht 72.0 in | Wt 214.4 lb

## 2021-12-27 DIAGNOSIS — I10 Essential (primary) hypertension: Secondary | ICD-10-CM

## 2021-12-27 DIAGNOSIS — R569 Unspecified convulsions: Secondary | ICD-10-CM

## 2021-12-27 DIAGNOSIS — Z9081 Acquired absence of spleen: Secondary | ICD-10-CM

## 2021-12-27 DIAGNOSIS — I63511 Cerebral infarction due to unspecified occlusion or stenosis of right middle cerebral artery: Secondary | ICD-10-CM

## 2021-12-27 DIAGNOSIS — E782 Mixed hyperlipidemia: Secondary | ICD-10-CM

## 2021-12-27 DIAGNOSIS — I69352 Hemiplegia and hemiparesis following cerebral infarction affecting left dominant side: Secondary | ICD-10-CM
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
## 2022-01-06 ENCOUNTER — Encounter: Payer: Self-pay | Attending: Registered Nurse | Admitting: Registered Nurse

## 2022-01-18 ENCOUNTER — Other Ambulatory Visit (HOSPITAL_COMMUNITY): Payer: Self-pay

## 2022-01-24 ENCOUNTER — Other Ambulatory Visit (HOSPITAL_COMMUNITY): Payer: Self-pay

## 2022-01-24 ENCOUNTER — Encounter: Payer: Self-pay | Admitting: Family Medicine

## 2022-01-24 ENCOUNTER — Ambulatory Visit (INDEPENDENT_AMBULATORY_CARE_PROVIDER_SITE_OTHER): Payer: Self-pay | Admitting: Family Medicine

## 2022-01-24 ENCOUNTER — Telehealth: Payer: Self-pay | Admitting: Family Medicine

## 2022-01-24 VITALS — BP 118/80 | HR 58 | Temp 97.6°F | Ht 72.0 in | Wt 215.4 lb

## 2022-01-24 DIAGNOSIS — K219 Gastro-esophageal reflux disease without esophagitis: Secondary | ICD-10-CM

## 2022-01-24 DIAGNOSIS — I63511 Cerebral infarction due to unspecified occlusion or stenosis of right middle cerebral artery: Secondary | ICD-10-CM

## 2022-01-24 DIAGNOSIS — R569 Unspecified convulsions: Secondary | ICD-10-CM

## 2022-01-24 DIAGNOSIS — E782 Mixed hyperlipidemia: Secondary | ICD-10-CM

## 2022-01-24 DIAGNOSIS — Z9081 Acquired absence of spleen: Secondary | ICD-10-CM

## 2022-01-24 DIAGNOSIS — Z23 Encounter for immunization: Secondary | ICD-10-CM

## 2022-01-24 DIAGNOSIS — I10 Essential (primary) hypertension: Secondary | ICD-10-CM

## 2022-01-24 MED ORDER — PANTOPRAZOLE SODIUM 40 MG PO TBEC
40.0000 mg | DELAYED_RELEASE_TABLET | Freq: Every day | ORAL | 0 refills | Status: DC
  Filled 2022-01-24 (×2): qty 30, 30d supply, fill #0

## 2022-01-24 MED ORDER — ROSUVASTATIN CALCIUM 40 MG PO TABS
40.0000 mg | ORAL_TABLET | Freq: Every day | ORAL | 0 refills | Status: DC
  Filled 2022-01-24 (×2): qty 30, 30d supply, fill #0

## 2022-01-24 MED ORDER — LEVETIRACETAM 750 MG PO TABS
1500.0000 mg | ORAL_TABLET | Freq: Two times a day (BID) | ORAL | 0 refills | Status: DC
  Filled 2022-01-24 (×2): qty 120, 30d supply, fill #0

## 2022-01-24 MED ORDER — ROSUVASTATIN CALCIUM 40 MG PO TABS
40.0000 mg | ORAL_TABLET | Freq: Every day | ORAL | 3 refills | Status: DC
  Filled 2022-01-24: qty 90, 90d supply, fill #0
  Filled 2022-01-24: qty 30, 30d supply, fill #0

## 2022-01-24 MED ORDER — PANTOPRAZOLE SODIUM 40 MG PO TBEC
40.0000 mg | DELAYED_RELEASE_TABLET | Freq: Every day | ORAL | 0 refills | Status: DC
  Filled 2022-01-24: qty 90, 90d supply, fill #0
  Filled 2022-01-24: qty 30, 30d supply, fill #0

## 2022-01-24 NOTE — Telephone Encounter (Signed)
Called patients mother, Rx was transferred to [facility] outpatient pharmacy.  She received a call that she can pick up meds there. Dm/cma
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
## 2022-01-24 NOTE — Telephone Encounter (Signed)
[phone] [facility]  stated that they believe the prescription was sent there by mistake and to reach out to the pt to find out where to send the prescription

## 2022-01-27 ENCOUNTER — Encounter: Payer: Self-pay | Admitting: Neurology

## 2022-01-28 DIAGNOSIS — Z419 Encounter for procedure for purposes other than remedying health state, unspecified: Secondary | ICD-10-CM | POA: Diagnosis not present

## 2022-02-03 ENCOUNTER — Ambulatory Visit: Payer: Medicaid Other | Admitting: Physical Therapy
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
## 2022-02-04 ENCOUNTER — Ambulatory Visit: Payer: Medicaid Other | Admitting: Physical Therapy

## 2022-02-22 ENCOUNTER — Telehealth: Payer: Self-pay | Admitting: Family Medicine

## 2022-02-22 NOTE — Telephone Encounter (Signed)
Spoke to patient's mother, she states that patient has had 2 falls  12/23 & 12/25.  He is off balance and walking more to the stroke side and states he is in pain all the time x 1 week?   He is scheduled for an appointment 02/23/22.   Already has a referral for PR. Dm/cma

## 2022-02-22 NOTE — Telephone Encounter (Signed)
Caller Name: Izel Eisenhardt Call back phone #: [phone]  Reason for Call: Pt has history of strokes. He is having issues with his balance a lot recently and is now unable to dress himself. He is in "unbearable" pain. Transferred to triage as well as scheduled him for OV tomorrow 12/27 @3pm ,

## 2022-02-23 ENCOUNTER — Encounter: Payer: Self-pay | Admitting: Family Medicine

## 2022-02-23 ENCOUNTER — Ambulatory Visit (INDEPENDENT_AMBULATORY_CARE_PROVIDER_SITE_OTHER): Payer: Medicaid Other | Admitting: Family Medicine

## 2022-02-23 VITALS — BP 116/72 | HR 74 | Temp 96.8°F | Ht 72.0 in | Wt 212.0 lb

## 2022-02-23 DIAGNOSIS — I63511 Cerebral infarction due to unspecified occlusion or stenosis of right middle cerebral artery: Secondary | ICD-10-CM | POA: Diagnosis not present

## 2022-02-23 DIAGNOSIS — R569 Unspecified convulsions: Secondary | ICD-10-CM | POA: Diagnosis not present

## 2022-02-23 DIAGNOSIS — Z9081 Acquired absence of spleen: Secondary | ICD-10-CM | POA: Diagnosis not present

## 2022-02-23 DIAGNOSIS — S40012A Contusion of left shoulder, initial encounter: Secondary | ICD-10-CM

## 2022-02-23 DIAGNOSIS — Z23 Encounter for immunization: Secondary | ICD-10-CM | POA: Diagnosis not present
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
## 2022-02-24 ENCOUNTER — Other Ambulatory Visit: Payer: Self-pay | Admitting: Family Medicine

## 2022-02-24 DIAGNOSIS — R569 Unspecified convulsions: Secondary | ICD-10-CM

## 2022-02-24 DIAGNOSIS — E782 Mixed hyperlipidemia: Secondary | ICD-10-CM

## 2022-02-24 DIAGNOSIS — K219 Gastro-esophageal reflux disease without esophagitis: Secondary | ICD-10-CM

## 2022-02-24 MED ORDER — PANTOPRAZOLE SODIUM 40 MG PO TBEC: 40.0000 mg | DELAYED_RELEASE_TABLET | Freq: Every day | ORAL | 3 refills | Status: DC

## 2022-02-24 MED ORDER — ROSUVASTATIN CALCIUM 40 MG PO TABS: 40.0000 mg | ORAL_TABLET | Freq: Every day | ORAL | 3 refills | Status: DC

## 2022-02-24 MED ORDER — LEVETIRACETAM 750 MG PO TABS: 1500.0000 mg | ORAL_TABLET | Freq: Two times a day (BID) | ORAL | 3 refills | Status: DC

## 2022-02-24 NOTE — Telephone Encounter (Signed)
Patient's mother notified VIA phone.  Dm/cma  

## 2022-02-24 NOTE — Telephone Encounter (Signed)
Caller Name: Aric Jost Call back phone #: [phone]   MEDICATION(S):  All medications  Days of Med Remaining: 0  Has the patient contacted their pharmacy (YES/NO)? Pt can no longer use the [place] pharmacy, please send to pharmacy listed below  What did pharmacy advise?   Preferred Pharmacy:  [facility] [address] [place], [place], [place] [address] [phone]  ~~~Please advise patient/caregiver to allow 2-3 business days to process RX refills.

## 2022-02-28 DIAGNOSIS — Z419 Encounter for procedure for purposes other than remedying health state, unspecified: Secondary | ICD-10-CM | POA: Diagnosis not present

## 2022-03-02 ENCOUNTER — Ambulatory Visit: Payer: Medicaid Other | Attending: Family Medicine | Admitting: Physical Therapy

## 2022-03-02 ENCOUNTER — Telehealth: Payer: Self-pay | Admitting: Physical Therapy

## 2022-03-02 VITALS — BP 117/80 | HR 54

## 2022-03-02 DIAGNOSIS — R41840 Attention and concentration deficit: Secondary | ICD-10-CM | POA: Insufficient documentation

## 2022-03-02 DIAGNOSIS — I69354 Hemiplegia and hemiparesis following cerebral infarction affecting left non-dominant side: Secondary | ICD-10-CM | POA: Insufficient documentation

## 2022-03-02 DIAGNOSIS — I69352 Hemiplegia and hemiparesis following cerebral infarction affecting left dominant side: Secondary | ICD-10-CM

## 2022-03-02 DIAGNOSIS — M25512 Pain in left shoulder: Secondary | ICD-10-CM | POA: Insufficient documentation

## 2022-03-02 DIAGNOSIS — I63511 Cerebral infarction due to unspecified occlusion or stenosis of right middle cerebral artery: Secondary | ICD-10-CM | POA: Insufficient documentation

## 2022-03-02 DIAGNOSIS — R278 Other lack of coordination: Secondary | ICD-10-CM | POA: Insufficient documentation

## 2022-03-02 DIAGNOSIS — M6281 Muscle weakness (generalized): Secondary | ICD-10-CM | POA: Insufficient documentation
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
## 2022-03-02 NOTE — Telephone Encounter (Signed)
Dr. Gena Fray,  Nickolas Madrid was evaluated by PT on 03/02/22. The patient would benefit from an OT evaluation to address L shoulder pain and hemiparesis.    If you agree, please place an order in [facility] workque in [facility] or fax the order to [phone].  Thank you, Francena Hanly, Pt, [place] [address] [place], [place]  [address] Phone:  [phone] Fax:  [phone]

## 2022-03-09 ENCOUNTER — Ambulatory Visit (INDEPENDENT_AMBULATORY_CARE_PROVIDER_SITE_OTHER): Payer: Medicaid Other | Admitting: Neurology

## 2022-03-09 ENCOUNTER — Encounter: Payer: Self-pay | Admitting: Neurology

## 2022-03-09 VITALS — BP 143/93 | HR 59 | Ht 72.0 in | Wt 209.4 lb

## 2022-03-09 DIAGNOSIS — Z8673 Personal history of transient ischemic attack (TIA), and cerebral infarction without residual deficits: Secondary | ICD-10-CM

## 2022-03-09 DIAGNOSIS — R252 Cramp and spasm: Secondary | ICD-10-CM

## 2022-03-09 DIAGNOSIS — I69398 Other sequelae of cerebral infarction: Secondary | ICD-10-CM

## 2022-03-09 DIAGNOSIS — R569 Unspecified convulsions: Secondary | ICD-10-CM

## 2022-03-09 MED ORDER — LEVETIRACETAM 750 MG PO TABS: 1500.0000 mg | ORAL_TABLET | Freq: Two times a day (BID) | ORAL | 3 refills | Status: DC
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
## 2022-03-14 ENCOUNTER — Other Ambulatory Visit: Payer: Self-pay

## 2022-03-14 NOTE — Patient Outreach (Signed)
First telephone outreach attempt to obtain mRS. No answer. Left message for returned call.  Bryana Froemming [facility]-Care Management Assistant [phone]  

## 2022-03-16 ENCOUNTER — Other Ambulatory Visit: Payer: Self-pay

## 2022-03-16 NOTE — Patient Outreach (Signed)
Second telephone outreach attempt to obtain mRS. No answer. Left message for returned call.  Lorane Cousar [facility]-Care Management Assistant [phone]  

## 2022-03-17 ENCOUNTER — Other Ambulatory Visit: Payer: Self-pay

## 2022-03-17 NOTE — Patient Outreach (Signed)
3 outreach attempts were completed to obtain mRs. mRs could not be obtained because patient never returned my calls. mRs=7    Frank Warner Care Management Assistant [phone]  

## 2022-03-21 ENCOUNTER — Other Ambulatory Visit: Payer: Self-pay
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
## 2022-03-22 ENCOUNTER — Ambulatory Visit: Payer: Medicaid Other | Admitting: Occupational Therapy

## 2022-03-22 ENCOUNTER — Encounter: Payer: Self-pay | Admitting: Occupational Therapy

## 2022-03-22 DIAGNOSIS — I69354 Hemiplegia and hemiparesis following cerebral infarction affecting left non-dominant side: Secondary | ICD-10-CM | POA: Diagnosis not present

## 2022-03-22 DIAGNOSIS — R41840 Attention and concentration deficit: Secondary | ICD-10-CM

## 2022-03-22 DIAGNOSIS — M6281 Muscle weakness (generalized): Secondary | ICD-10-CM

## 2022-03-22 DIAGNOSIS — M25512 Pain in left shoulder: Secondary | ICD-10-CM | POA: Diagnosis not present

## 2022-03-22 DIAGNOSIS — I69352 Hemiplegia and hemiparesis following cerebral infarction affecting left dominant side: Secondary | ICD-10-CM | POA: Diagnosis not present

## 2022-03-22 DIAGNOSIS — R278 Other lack of coordination: Secondary | ICD-10-CM | POA: Diagnosis not present

## 2022-03-22 DIAGNOSIS — I63511 Cerebral infarction due to unspecified occlusion or stenosis of right middle cerebral artery: Secondary | ICD-10-CM | POA: Diagnosis not present

## 2022-03-23 ENCOUNTER — Encounter: Payer: Self-pay | Admitting: Family Medicine

## 2022-03-23 ENCOUNTER — Ambulatory Visit (INDEPENDENT_AMBULATORY_CARE_PROVIDER_SITE_OTHER): Payer: Medicaid Other | Admitting: Family Medicine

## 2022-03-23 VITALS — BP 106/64 | HR 82 | Temp 97.5°F | Ht 72.0 in | Wt 205.6 lb

## 2022-03-23 DIAGNOSIS — R569 Unspecified convulsions: Secondary | ICD-10-CM

## 2022-03-23 DIAGNOSIS — I10 Essential (primary) hypertension: Secondary | ICD-10-CM

## 2022-03-23 DIAGNOSIS — Z23 Encounter for immunization: Secondary | ICD-10-CM | POA: Diagnosis not present

## 2022-03-23 DIAGNOSIS — I63511 Cerebral infarction due to unspecified occlusion or stenosis of right middle cerebral artery: Secondary | ICD-10-CM

## 2022-03-23 DIAGNOSIS — Z9081 Acquired absence of spleen: Secondary | ICD-10-CM | POA: Diagnosis not present
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
## 2022-03-31 DIAGNOSIS — Z419 Encounter for procedure for purposes other than remedying health state, unspecified: Secondary | ICD-10-CM | POA: Diagnosis not present

## 2022-04-12 ENCOUNTER — Encounter: Payer: Self-pay | Admitting: Occupational Therapy

## 2022-04-12 ENCOUNTER — Ambulatory Visit: Payer: Medicaid Other | Attending: Family Medicine | Admitting: Occupational Therapy

## 2022-04-12 DIAGNOSIS — I69354 Hemiplegia and hemiparesis following cerebral infarction affecting left non-dominant side: Secondary | ICD-10-CM | POA: Insufficient documentation

## 2022-04-12 DIAGNOSIS — M6281 Muscle weakness (generalized): Secondary | ICD-10-CM | POA: Diagnosis not present

## 2022-04-12 DIAGNOSIS — M25512 Pain in left shoulder: Secondary | ICD-10-CM | POA: Insufficient documentation

## 2022-04-12 DIAGNOSIS — R278 Other lack of coordination: Secondary | ICD-10-CM | POA: Diagnosis not present

## 2022-04-12 NOTE — Patient Instructions (Signed)
Flexion (Passive)    Sitting upright, slide BOTH forearms forward along table w/ folded towel under forearms, bending from the waist until a stretch is felt. Hold __3__ seconds. Repeat _10___ times. Do __2__ sessions per day.   Flexion (Assistive)    Hold Lt wrist with Rt hand, Lt thumb side up, and raise arms slowly above head, keeping elbows as straight as possible. Can be done sitting or lying. Repeat __10__ times. Do _2___ sessions per day.  Supination (Passive)    Keep elbow bent at right angle and held firmly at side. Use other hand to turn forearm until palm faces upward. Hold _10___ seconds. Repeat _5___ times. Do __2__ sessions per day.  4. Hold round stick (broom handle, tied up umbrella, plastic PVC piping) about 2.5 ft long in both hands, slide along thighs and down to floor, then back up to thighs and slide to belly button while squeezing shoulder blades together, repeat 10 times.   5. Try to OPEN hand x 10   6. Fill up 16 oz plastic water bottle with 1/3 amount of water and then put top on it - practice picking up and bringing to mouth to practice "drinking". Then slide water bottle out further along table and open hand to release bottle, then pull hand away. Repeat 10 times.   7. Quinn Axe and unstack 3-5 cups
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
## 2022-04-18 ENCOUNTER — Encounter: Payer: Self-pay | Admitting: Occupational Therapy

## 2022-04-18 ENCOUNTER — Ambulatory Visit: Payer: Medicaid Other | Admitting: Occupational Therapy

## 2022-04-18 DIAGNOSIS — M25512 Pain in left shoulder: Secondary | ICD-10-CM | POA: Diagnosis not present

## 2022-04-18 DIAGNOSIS — M6281 Muscle weakness (generalized): Secondary | ICD-10-CM | POA: Diagnosis not present

## 2022-04-18 DIAGNOSIS — R278 Other lack of coordination: Secondary | ICD-10-CM | POA: Diagnosis not present

## 2022-04-18 DIAGNOSIS — I69354 Hemiplegia and hemiparesis following cerebral infarction affecting left non-dominant side: Secondary | ICD-10-CM
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
## 2022-04-19 ENCOUNTER — Encounter: Payer: Medicaid Other | Admitting: Occupational Therapy

## 2022-04-26 ENCOUNTER — Ambulatory Visit: Payer: Medicaid Other | Admitting: Occupational Therapy

## 2022-04-29 DIAGNOSIS — Z419 Encounter for procedure for purposes other than remedying health state, unspecified: Secondary | ICD-10-CM | POA: Diagnosis not present

## 2022-05-02 ENCOUNTER — Telehealth: Payer: Self-pay | Admitting: Occupational Therapy

## 2022-05-02 ENCOUNTER — Ambulatory Visit: Payer: Medicaid Other | Admitting: Occupational Therapy

## 2022-05-02 ENCOUNTER — Encounter: Payer: Self-pay | Admitting: Occupational Therapy

## 2022-05-02 ENCOUNTER — Ambulatory Visit: Payer: Medicaid Other | Attending: Family Medicine | Admitting: Occupational Therapy

## 2022-05-02 DIAGNOSIS — R569 Unspecified convulsions: Secondary | ICD-10-CM | POA: Diagnosis not present

## 2022-05-02 DIAGNOSIS — R41840 Attention and concentration deficit: Secondary | ICD-10-CM | POA: Insufficient documentation

## 2022-05-02 DIAGNOSIS — I69354 Hemiplegia and hemiparesis following cerebral infarction affecting left non-dominant side: Secondary | ICD-10-CM | POA: Insufficient documentation

## 2022-05-02 DIAGNOSIS — M6281 Muscle weakness (generalized): Secondary | ICD-10-CM | POA: Diagnosis not present

## 2022-05-02 DIAGNOSIS — I63512 Cerebral infarction due to unspecified occlusion or stenosis of left middle cerebral artery: Secondary | ICD-10-CM | POA: Insufficient documentation

## 2022-05-02 DIAGNOSIS — M25512 Pain in left shoulder: Secondary | ICD-10-CM | POA: Diagnosis not present

## 2022-05-02 DIAGNOSIS — R278 Other lack of coordination: Secondary | ICD-10-CM | POA: Insufficient documentation

## 2022-05-02 DIAGNOSIS — I69352 Hemiplegia and hemiparesis following cerebral infarction affecting left dominant side: Secondary | ICD-10-CM | POA: Insufficient documentation

## 2022-05-02 NOTE — Telephone Encounter (Signed)
Hello Dr. Delice Lesch,   I have been working with Nickolas Madrid in Occupational therapy. He had a seizure with his stroke in September 2023. Do you think we could do NMES (estim) to hand/wrist with h/o seizure? He said the therapists in the hospital used estim but just wanted to get clearance before doing. Thank you  Redmond Baseman, OTR/L

## 2022-05-02 NOTE — Telephone Encounter (Signed)
I think it should be okay, thanks
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
## 2022-05-04 ENCOUNTER — Ambulatory Visit: Payer: Medicaid Other | Admitting: Occupational Therapy

## 2022-05-09 ENCOUNTER — Ambulatory Visit: Payer: Medicaid Other | Admitting: Occupational Therapy

## 2022-05-09 ENCOUNTER — Encounter: Payer: Self-pay | Admitting: Occupational Therapy

## 2022-05-09 DIAGNOSIS — I69352 Hemiplegia and hemiparesis following cerebral infarction affecting left dominant side: Secondary | ICD-10-CM | POA: Diagnosis not present

## 2022-05-09 DIAGNOSIS — R278 Other lack of coordination: Secondary | ICD-10-CM | POA: Diagnosis not present

## 2022-05-09 DIAGNOSIS — M6281 Muscle weakness (generalized): Secondary | ICD-10-CM

## 2022-05-09 DIAGNOSIS — M25512 Pain in left shoulder: Secondary | ICD-10-CM

## 2022-05-09 DIAGNOSIS — R569 Unspecified convulsions: Secondary | ICD-10-CM | POA: Diagnosis not present

## 2022-05-09 DIAGNOSIS — I69354 Hemiplegia and hemiparesis following cerebral infarction affecting left non-dominant side: Secondary | ICD-10-CM

## 2022-05-09 DIAGNOSIS — R41840 Attention and concentration deficit: Secondary | ICD-10-CM | POA: Diagnosis not present

## 2022-05-09 DIAGNOSIS — I63512 Cerebral infarction due to unspecified occlusion or stenosis of left middle cerebral artery: Secondary | ICD-10-CM | POA: Diagnosis not present
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
## 2022-05-11 ENCOUNTER — Ambulatory Visit: Payer: Medicaid Other | Admitting: Occupational Therapy

## 2022-05-16 ENCOUNTER — Ambulatory Visit: Payer: Medicaid Other | Admitting: Occupational Therapy

## 2022-05-16 DIAGNOSIS — R41840 Attention and concentration deficit: Secondary | ICD-10-CM | POA: Diagnosis not present

## 2022-05-16 DIAGNOSIS — M6281 Muscle weakness (generalized): Secondary | ICD-10-CM

## 2022-05-16 DIAGNOSIS — I69354 Hemiplegia and hemiparesis following cerebral infarction affecting left non-dominant side: Secondary | ICD-10-CM | POA: Diagnosis not present

## 2022-05-16 DIAGNOSIS — I69352 Hemiplegia and hemiparesis following cerebral infarction affecting left dominant side: Secondary | ICD-10-CM | POA: Diagnosis not present

## 2022-05-16 DIAGNOSIS — R569 Unspecified convulsions: Secondary | ICD-10-CM | POA: Diagnosis not present

## 2022-05-16 DIAGNOSIS — R278 Other lack of coordination: Secondary | ICD-10-CM

## 2022-05-16 DIAGNOSIS — M25512 Pain in left shoulder: Secondary | ICD-10-CM | POA: Diagnosis not present

## 2022-05-16 DIAGNOSIS — I63512 Cerebral infarction due to unspecified occlusion or stenosis of left middle cerebral artery: Secondary | ICD-10-CM | POA: Diagnosis not present
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
## 2022-05-18 ENCOUNTER — Ambulatory Visit: Payer: Medicaid Other | Admitting: Occupational Therapy

## 2022-05-18 ENCOUNTER — Encounter: Payer: Self-pay | Admitting: Occupational Therapy

## 2022-05-18 DIAGNOSIS — R569 Unspecified convulsions: Secondary | ICD-10-CM

## 2022-05-18 DIAGNOSIS — M6281 Muscle weakness (generalized): Secondary | ICD-10-CM

## 2022-05-18 DIAGNOSIS — R41840 Attention and concentration deficit: Secondary | ICD-10-CM | POA: Diagnosis not present

## 2022-05-18 DIAGNOSIS — I69354 Hemiplegia and hemiparesis following cerebral infarction affecting left non-dominant side: Secondary | ICD-10-CM

## 2022-05-18 DIAGNOSIS — R278 Other lack of coordination: Secondary | ICD-10-CM

## 2022-05-18 DIAGNOSIS — M25512 Pain in left shoulder: Secondary | ICD-10-CM | POA: Diagnosis not present

## 2022-05-18 DIAGNOSIS — I69352 Hemiplegia and hemiparesis following cerebral infarction affecting left dominant side: Secondary | ICD-10-CM | POA: Diagnosis not present

## 2022-05-18 DIAGNOSIS — I63512 Cerebral infarction due to unspecified occlusion or stenosis of left middle cerebral artery: Secondary | ICD-10-CM
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
## 2022-05-23 ENCOUNTER — Ambulatory Visit: Payer: Medicaid Other | Admitting: Occupational Therapy

## 2022-05-23 ENCOUNTER — Encounter: Payer: Self-pay | Admitting: Occupational Therapy

## 2022-05-23 DIAGNOSIS — R278 Other lack of coordination: Secondary | ICD-10-CM

## 2022-05-23 DIAGNOSIS — I63512 Cerebral infarction due to unspecified occlusion or stenosis of left middle cerebral artery: Secondary | ICD-10-CM | POA: Diagnosis not present

## 2022-05-23 DIAGNOSIS — M6281 Muscle weakness (generalized): Secondary | ICD-10-CM | POA: Diagnosis not present

## 2022-05-23 DIAGNOSIS — R569 Unspecified convulsions: Secondary | ICD-10-CM | POA: Diagnosis not present

## 2022-05-23 DIAGNOSIS — I69354 Hemiplegia and hemiparesis following cerebral infarction affecting left non-dominant side: Secondary | ICD-10-CM | POA: Diagnosis not present

## 2022-05-23 DIAGNOSIS — I69352 Hemiplegia and hemiparesis following cerebral infarction affecting left dominant side: Secondary | ICD-10-CM | POA: Diagnosis not present

## 2022-05-23 DIAGNOSIS — R41840 Attention and concentration deficit: Secondary | ICD-10-CM | POA: Diagnosis not present

## 2022-05-23 DIAGNOSIS — M25512 Pain in left shoulder: Secondary | ICD-10-CM | POA: Diagnosis not present
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
## 2022-05-23 NOTE — Addendum Note (Signed)
Addended by: Dennis Bast on: 05/23/2022 05:08 PM   Modules accepted: Orders

## 2022-05-30 DIAGNOSIS — Z419 Encounter for procedure for purposes other than remedying health state, unspecified: Secondary | ICD-10-CM | POA: Diagnosis not present

## 2022-06-08 ENCOUNTER — Ambulatory Visit: Payer: Medicaid Other | Attending: Family Medicine | Admitting: Occupational Therapy

## 2022-06-22 ENCOUNTER — Encounter: Payer: Self-pay | Admitting: Family Medicine

## 2022-06-22 ENCOUNTER — Ambulatory Visit (INDEPENDENT_AMBULATORY_CARE_PROVIDER_SITE_OTHER): Payer: Medicaid Other | Admitting: Family Medicine

## 2022-06-22 VITALS — BP 116/72 | HR 74 | Temp 98.5°F | Ht 72.0 in | Wt 192.2 lb

## 2022-06-22 DIAGNOSIS — R569 Unspecified convulsions: Secondary | ICD-10-CM | POA: Diagnosis not present

## 2022-06-22 DIAGNOSIS — I63511 Cerebral infarction due to unspecified occlusion or stenosis of right middle cerebral artery: Secondary | ICD-10-CM

## 2022-06-22 DIAGNOSIS — I69354 Hemiplegia and hemiparesis following cerebral infarction affecting left non-dominant side: Secondary | ICD-10-CM | POA: Diagnosis not present
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
## 2022-06-22 NOTE — Assessment & Plan Note (Signed)
Stable. We discussed the importance of daily exercises for at least maintaining in strength in his left arm and preventing long term issues with weakness and contracture. BP remains in good control. Frank Warner has been able to lose weight and now is achieving a near normal BMI.

## 2022-06-22 NOTE — Assessment & Plan Note (Signed)
Stable. No recurrent seizures. Continue levetiracetam 1,500 mg bid.

## 2022-06-29 DIAGNOSIS — Z419 Encounter for procedure for purposes other than remedying health state, unspecified: Secondary | ICD-10-CM | POA: Diagnosis not present

## 2022-07-30 DIAGNOSIS — Z419 Encounter for procedure for purposes other than remedying health state, unspecified: Secondary | ICD-10-CM | POA: Diagnosis not present

## 2022-08-29 DIAGNOSIS — Z419 Encounter for procedure for purposes other than remedying health state, unspecified: Secondary | ICD-10-CM | POA: Diagnosis not present

## 2022-09-09 ENCOUNTER — Ambulatory Visit: Payer: Medicaid Other | Admitting: Neurology

## 2022-09-09 ENCOUNTER — Encounter: Payer: Self-pay | Admitting: Neurology

## 2022-09-29 DIAGNOSIS — Z419 Encounter for procedure for purposes other than remedying health state, unspecified: Secondary | ICD-10-CM | POA: Diagnosis not present

## 2022-10-30 DIAGNOSIS — Z419 Encounter for procedure for purposes other than remedying health state, unspecified: Secondary | ICD-10-CM | POA: Diagnosis not present

## 2022-11-29 DIAGNOSIS — Z419 Encounter for procedure for purposes other than remedying health state, unspecified: Secondary | ICD-10-CM | POA: Diagnosis not present

## 2022-12-30 DIAGNOSIS — Z419 Encounter for procedure for purposes other than remedying health state, unspecified: Secondary | ICD-10-CM | POA: Diagnosis not present

## 2023-01-29 DIAGNOSIS — Z419 Encounter for procedure for purposes other than remedying health state, unspecified: Secondary | ICD-10-CM | POA: Diagnosis not present

## 2023-03-01 DIAGNOSIS — Z419 Encounter for procedure for purposes other than remedying health state, unspecified: Secondary | ICD-10-CM | POA: Diagnosis not present

## 2023-04-01 DIAGNOSIS — Z419 Encounter for procedure for purposes other than remedying health state, unspecified: Secondary | ICD-10-CM | POA: Diagnosis not present

## 2023-04-14 ENCOUNTER — Ambulatory Visit: Payer: Self-pay | Admitting: Family Medicine
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
## 2023-04-17 NOTE — Telephone Encounter (Signed)
I called patient and left message to return call to make sure he went to ED.

## 2023-04-21 ENCOUNTER — Emergency Department (HOSPITAL_COMMUNITY)
Admission: EM | Admit: 2023-04-21 | Discharge: 2023-04-22 | Disposition: A | Discharge status: Home / Self Care | Payer: Medicaid Other | Attending: Emergency Medicine | Admitting: Emergency Medicine

## 2023-04-21 ENCOUNTER — Emergency Department (HOSPITAL_COMMUNITY): Payer: Medicaid Other

## 2023-04-21 ENCOUNTER — Encounter (HOSPITAL_COMMUNITY): Payer: Self-pay | Admitting: Emergency Medicine

## 2023-04-21 DIAGNOSIS — R519 Headache, unspecified: Secondary | ICD-10-CM | POA: Insufficient documentation

## 2023-04-21 DIAGNOSIS — I6930 Unspecified sequelae of cerebral infarction: Secondary | ICD-10-CM | POA: Diagnosis not present

## 2023-04-21 DIAGNOSIS — Z79899 Other long term (current) drug therapy: Secondary | ICD-10-CM | POA: Insufficient documentation

## 2023-04-21 DIAGNOSIS — R29818 Other symptoms and signs involving the nervous system: Secondary | ICD-10-CM | POA: Diagnosis not present

## 2023-04-21 DIAGNOSIS — Z7982 Long term (current) use of aspirin: Secondary | ICD-10-CM | POA: Diagnosis not present

## 2023-04-21 DIAGNOSIS — R001 Bradycardia, unspecified: Secondary | ICD-10-CM | POA: Diagnosis not present

## 2023-04-21 DIAGNOSIS — R9089 Other abnormal findings on diagnostic imaging of central nervous system: Secondary | ICD-10-CM | POA: Diagnosis not present

## 2023-04-21 DIAGNOSIS — Z8673 Personal history of transient ischemic attack (TIA), and cerebral infarction without residual deficits: Secondary | ICD-10-CM | POA: Diagnosis not present

## 2023-04-21 DIAGNOSIS — I63511 Cerebral infarction due to unspecified occlusion or stenosis of right middle cerebral artery: Secondary | ICD-10-CM | POA: Diagnosis not present

## 2023-04-21 LAB — I-STAT CHEM 8, ED
BUN: 12 mg/dL (ref 6–20)
Calcium, Ion: 1.11 mmol/L — ABNORMAL LOW (ref 1.15–1.40)
Chloride: 103 mmol/L (ref 98–111)
Creatinine, Ser: 1.1 mg/dL (ref 0.61–1.24)
Glucose, Bld: 91 mg/dL (ref 70–99)
HCT: 47 % (ref 39.0–52.0)
Hemoglobin: 16 g/dL (ref 13.0–17.0)
Potassium: 3.9 mmol/L (ref 3.5–5.1)
Sodium: 141 mmol/L (ref 135–145)
TCO2: 28 mmol/L (ref 22–32)

## 2023-04-21 LAB — COMPREHENSIVE METABOLIC PANEL
ALT: 19 U/L (ref 0–44)
AST: 22 U/L (ref 15–41)
Albumin: 3.6 g/dL (ref 3.5–5.0)
Alkaline Phosphatase: 44 U/L (ref 38–126)
Anion gap: 10 (ref 5–15)
BUN: 10 mg/dL (ref 6–20)
CO2: 28 mmol/L (ref 22–32)
Calcium: 9.5 mg/dL (ref 8.9–10.3)
Chloride: 102 mmol/L (ref 98–111)
Creatinine, Ser: 1.05 mg/dL (ref 0.61–1.24)
GFR, Estimated: >60 mL/min (ref >60)
Glucose, Bld: 94 mg/dL (ref 70–99)
Potassium: 4 mmol/L (ref 3.5–5.1)
Sodium: 140 mmol/L (ref 135–145)
Total Bilirubin: 1.4 mg/dL — ABNORMAL HIGH (ref 0.0–1.2)
Total Protein: 7.4 g/dL (ref 6.5–8.1)

## 2023-04-21 LAB — ETHANOL: Alcohol, Ethyl (B): <10 mg/dL (ref <10)

## 2023-04-21 NOTE — ED Notes (Signed)
Delay in pt care due to stat CT scan.
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
## 2023-04-21 NOTE — ED Triage Notes (Signed)
Patient BIB family for sudden onset of 10/10 headache that started around 1800, HA resolved to 2/10 pain after aspirin. Patient is A&Ox4 with hx of stroke. Only HA as symptom right now.

## 2023-04-21 NOTE — ED Provider Triage Note (Addendum)
Emergency Medicine Provider Triage Evaluation Note  Rudy Luhmann , a 43 y.o. male  was evaluated in triage.  Pt complains of right-sided headache for the past week.  Patient states it feels similar to other headaches but is slightly worse.  Patient was taking aspirin no relief.  Patient dates he feels weak in his left upper extremity.  Patient does have history of stroke and was told by his doctor to come in.  Review of Systems  Positive:  Negative:   Physical Exam  BP (!) 143/97 (BP Location: Left Arm)   Pulse 63   Temp 98.2 F (36.8 C) (Oral)   Resp 17   SpO2 100%  Gen:   Awake, no distress   Resp:  Normal effort  MSK:   Moves extremities without difficulty  Other:  5 out of 5 bilateral grip strength, elbow flexion/extension, pupils PERRL, equal smile, no slurring of words  Medical Decision Making  Medically screening exam initiated at 8:00 PM.  Appropriate orders placed.  Jaleil Renwick was informed that the remainder of the evaluation will be completed by another provider, this initial triage assessment does not replace that evaluation, and the importance of remaining in the ED until their evaluation is complete.  Workup initiated, I quickly reviewed patient's head CT scan and it appears abnormal and given patient's concerning past medical history charge nurse notified that patient will need a room.    Netta Corrigan, PA-C 04/21/23 2003

## 2023-04-21 NOTE — ED Notes (Signed)
Patient in MRI currently.

## 2023-04-24 NOTE — Telephone Encounter (Signed)
 Copied from CRM [id number]. Topic: General - Other >> Apr 24, 2023 10:00 AM Pascal Lux wrote: Reason for CRM: Patient mom called back to leave a message: patient went to ER did CAT scan and MRI and he was not having another stroke. His issue is he needs to have his teeth extracted, which is why he's been having the severe migraines. He does have an appointment to get it removed.  Mom call back number: [phone].

## 2023-04-29 DIAGNOSIS — Z419 Encounter for procedure for purposes other than remedying health state, unspecified: Secondary | ICD-10-CM | POA: Diagnosis not present

## 2023-06-10 DIAGNOSIS — Z419 Encounter for procedure for purposes other than remedying health state, unspecified: Secondary | ICD-10-CM | POA: Diagnosis not present

## 2023-07-10 DIAGNOSIS — Z419 Encounter for procedure for purposes other than remedying health state, unspecified: Secondary | ICD-10-CM | POA: Diagnosis not present

## 2023-07-18 ENCOUNTER — Encounter: Payer: Self-pay | Admitting: Family Medicine

## 2023-07-18 ENCOUNTER — Ambulatory Visit (INDEPENDENT_AMBULATORY_CARE_PROVIDER_SITE_OTHER): Admitting: Family Medicine

## 2023-07-18 VITALS — BP 118/70 | HR 53 | Temp 97.7°F | Ht 72.0 in | Wt 163.2 lb

## 2023-07-18 DIAGNOSIS — K5900 Constipation, unspecified: Secondary | ICD-10-CM | POA: Diagnosis not present

## 2023-07-18 DIAGNOSIS — R569 Unspecified convulsions: Secondary | ICD-10-CM

## 2023-07-18 DIAGNOSIS — E782 Mixed hyperlipidemia: Secondary | ICD-10-CM

## 2023-07-18 DIAGNOSIS — Z8673 Personal history of transient ischemic attack (TIA), and cerebral infarction without residual deficits: Secondary | ICD-10-CM | POA: Diagnosis not present

## 2023-07-18 DIAGNOSIS — Z0000 Encounter for general adult medical examination without abnormal findings: Secondary | ICD-10-CM | POA: Insufficient documentation

## 2023-07-18 LAB — LIPID PANEL
Cholesterol: 132 mg/dL (ref 0–200)
HDL: 56.8 mg/dL (ref >39.00)
LDL Cholesterol: 66 mg/dL (ref 0–99)
NonHDL: 75.34
Total CHOL/HDL Ratio: 2
Triglycerides: 45 mg/dL (ref 0.0–149.0)
VLDL: 9 mg/dL (ref 0.0–40.0)

## 2023-07-18 NOTE — Assessment & Plan Note (Signed)
Stable. No recurrent seizures. Continue levetiracetam 1,500 mg bid.

## 2023-07-18 NOTE — Patient Instructions (Signed)
Constipation: Our goal is to achieve formed bowel movements daily or every-other-day.  You may need to try different combinations of the following options to find what works best for you - everybody's body works differently so feel free to adjust the dosages as needed.  Some options to help maintain bowel health include:   Dietary changes (more leafy greens, vegetables and fruits; less processed foods) Fiber supplementation (Benefiber, FiberCon, Metamucil or Psyllium). Start slow and increase gradually to full dose. Over-the-counter agents such as: stool softeners (Docusate or Colace) and/or laxatives (Miralax, milk of magnesia)  "Power Pudding" is a natural mixture that may help your constipation.  To make blend 1 cup applesauce, 1 cup wheat bran, and 3/4 cup prune juice, refrigerate and then take 1 tablespoon daily with a large glass of water as needed.  Green Latte  2 cups of spinach 2 teaspoons of matcha (non-sweet)  1 and half cup of diary (almond milk normally) 1 banana Some chia seeds and honey  Blend well in a blender

## 2023-07-18 NOTE — Assessment & Plan Note (Signed)
 Improved function related to left hemiparesis. Continue daily aspirin .

## 2023-07-18 NOTE — Assessment & Plan Note (Signed)
 Overall health is good. Recommend regular exercise. Discussed recommended screenings and immunizations.

## 2023-07-18 NOTE — Assessment & Plan Note (Signed)
 I will recheck lipids today. Continue rosuvastatin  40 mg daily.

## 2023-07-18 NOTE — Assessment & Plan Note (Signed)
 Reviewed management of constipation, including increasing fiber and adequate hydration. He can use Miralax  as needed.
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
## 2023-07-19 ENCOUNTER — Ambulatory Visit: Payer: Self-pay | Admitting: Family Medicine

## 2023-07-19 ENCOUNTER — Telehealth: Payer: Self-pay | Admitting: Family Medicine

## 2023-07-19 NOTE — Telephone Encounter (Signed)
 Copied from CRM [id number]. Topic: Clinical - Lab/Test Results >> Jul 19, 2023 10:18 AM Chasity T wrote: Reason for CRM: Tanya patients mother/care giver is giving nurse Denis a call back regarding information on his labs. Please call back at [phone]

## 2023-08-10 DIAGNOSIS — Z419 Encounter for procedure for purposes other than remedying health state, unspecified: Secondary | ICD-10-CM | POA: Diagnosis not present

## 2023-08-16 ENCOUNTER — Telehealth: Payer: Self-pay | Admitting: Family Medicine

## 2023-08-16 DIAGNOSIS — I63511 Cerebral infarction due to unspecified occlusion or stenosis of right middle cerebral artery: Secondary | ICD-10-CM

## 2023-08-16 NOTE — Telephone Encounter (Signed)
 Copied from CRM [id number]. Topic: General - Call Back - No Documentation >> Aug 16, 2023  4:03 PM Jim Motts C wrote: Reason for CRM: Patient's mother, Frank Warner, [phone] best contact called in and stated that she received a letter in the mail stating that provider needs to please provide current proof from medical profession verifying that Frank Warner cannot work or can work for [employer].  Letter is addressed to mom and letter needs to be provided by June 20th or medicaid or food stamps will be denied. Mom stated if best contact number is not answered, it is okay to call the other number on file.

## 2023-08-17 NOTE — Telephone Encounter (Signed)
 Patient needs a letter stating that he is unable to work due to his health conditions.  He needs this to re certify for his food stamps and medicaid.  Patients mother will bring the letter she received when she picks up the letter to turn in by the dead line of 08/26/23 (just received in the mail).  Please review and advise.  Thanks. Dm/cma

## 2023-08-18 NOTE — Telephone Encounter (Signed)
 Spoke to patient's mother, Bertell Broach, he needs to file for disability with Social Security office. He is not wanting to do this and has had them come out and he cussed them out and wouldn't let them fill out the papers at home.   Patient falls all the time at home, unable to open things , forget fullness, and doesn't make good decisions.   She will bring by the paper work she received so we can look at what they are asking for and if we can help. Dm/cma

## 2023-08-24 NOTE — Addendum Note (Signed)
 Addended by: THEDORA GARNETTE HERO on: 08/24/2023 02:04 PM   Modules accepted: Orders

## 2023-08-25 NOTE — Telephone Encounter (Signed)
 Patient's mother notified VIA phone regarding referral.  She will drop by the paperwork form the food stamps so we have a copy to see what they are asking.  Dm/cma

## 2023-09-04 ENCOUNTER — Telehealth: Payer: Self-pay

## 2023-09-04 NOTE — Telephone Encounter (Signed)
 Can you please and thank you redirect the Neurology referral back to Dr Georjean who patient has seen in the past.   Thanks. Dm/cma

## 2023-09-04 NOTE — Telephone Encounter (Signed)
 Patient dropped off document Patient Assistance Application, to be filled out by provider. Patient requested to send it back via Call Patient to pick up within ASAP. Document is located in providers tray at front office.Please advise at [facility] [phone]

## 2023-09-04 NOTE — Telephone Encounter (Signed)
 Form on my desk and will call patients mother after lunch.  Dm/cma

## 2023-09-04 NOTE — Telephone Encounter (Signed)
 Spoke to patients mother, Glenys, she is advised that the referral is bing resent to the Neurologist.  She will reach back out to the [facility] office regarding this. Provider will not do a letter .  Dm/cma

## 2023-09-09 DIAGNOSIS — Z419 Encounter for procedure for purposes other than remedying health state, unspecified: Secondary | ICD-10-CM | POA: Diagnosis not present

## 2023-09-19 ENCOUNTER — Telehealth: Payer: Self-pay

## 2023-09-19 DIAGNOSIS — I63511 Cerebral infarction due to unspecified occlusion or stenosis of right middle cerebral artery: Secondary | ICD-10-CM

## 2023-09-19 NOTE — Telephone Encounter (Signed)
 Tried to call Neuro to confirm referral sent over to [facility] for Dr Darice Shivers as stated in referral notes. Done on 09/05/23. Unable to speak to someone. Called patient and spoke with mom about where referral was sent. Gave info for [facility] to call about referral. [phone]. Pt stated can't make appt at that specific Neuro office due to having and outstanding balance that needs to be paid before appt can be set. Asking for a referral to a different office.   Copied from CRM [id number]. Topic: Referral - Question >> Sep 19, 2023 12:43 PM Chiquita SQUIBB wrote: Reason for CRM: Patients mother is calling in stating that they need the referral for NEUROLOGY sent over again so they can make the appointment. Patients mother stated she thought it was [facility], their phone number is [phone]. She stated they need it as soon possible as they gave them a 10 day extension to get it done for their medical forms.  Please contact the patients mother back at [phone].

## 2023-09-19 NOTE — Addendum Note (Signed)
 Addended by: THEDORA GARNETTE HERO on: 09/19/2023 04:31 PM   Modules accepted: Orders

## 2023-09-21 ENCOUNTER — Telehealth: Payer: Self-pay | Admitting: Family Medicine

## 2023-09-21 NOTE — Telephone Encounter (Signed)
 Copied from CRM [id number]. Topic: Referral - Status >> Sep 21, 2023  9:30 AM Viola FALCON wrote: Reason for CRM: Patient mother Glenys called regarding patient referral to Neurology, they called to schedule him for 03/12/24 at 8:30am and she wants to know if he can wait that long? Or if another referral can be put ine elsewhere that can see him sooner. Please call her [phone]

## 2023-09-26 ENCOUNTER — Telehealth: Payer: Self-pay

## 2023-09-26 NOTE — Telephone Encounter (Signed)
 Called and spoke to  patients mother,  he did get scheduled with [facility] 02/2024.  The referral was also sent to [facility] and she will call them to see if they can get him in sooner. Dm/cma

## 2023-09-26 NOTE — Telephone Encounter (Signed)
 Copied from CRM [id number]. Topic: Referral - Question >> Sep 26, 2023  8:10 AM Pinkey ORN wrote: Reason for CRM: Neuro Referral >> Sep 26, 2023  8:16 AM Pinkey ORN wrote: Patient's mother called in stating that she hasn't heard anything back in regards to scheduling her son's appointment. States she doesn't want to accept the 03/12/2024 appointment that was offered from the recommended place for referral. Glenys has another location in mind that she is requesting the new referral be sent over to.   [facility]  [address],  [address]  Office Number [phone]

## 2023-10-03 NOTE — Telephone Encounter (Signed)
 Karna, CMA called and spoke with mom, Glenys, on 09/26/23. Please let me know if I can be of any further assistance.

## 2023-10-10 DIAGNOSIS — Z419 Encounter for procedure for purposes other than remedying health state, unspecified: Secondary | ICD-10-CM | POA: Diagnosis not present

## 2023-11-10 DIAGNOSIS — Z419 Encounter for procedure for purposes other than remedying health state, unspecified: Secondary | ICD-10-CM | POA: Diagnosis not present

## 2024-01-09 ENCOUNTER — Encounter: Payer: Self-pay | Admitting: Neurology

## 2024-01-10 DIAGNOSIS — Z419 Encounter for procedure for purposes other than remedying health state, unspecified: Secondary | ICD-10-CM | POA: Diagnosis not present

## 2024-02-09 DIAGNOSIS — Z419 Encounter for procedure for purposes other than remedying health state, unspecified: Secondary | ICD-10-CM | POA: Diagnosis not present
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
## 2024-03-15 ENCOUNTER — Encounter: Payer: Self-pay | Admitting: Neurology

## 2024-03-15 ENCOUNTER — Ambulatory Visit: Admitting: Neurology

## 2024-03-15 VITALS — BP 138/86 | HR 56 | Ht 72.0 in | Wt 173.0 lb

## 2024-03-15 DIAGNOSIS — Z8673 Personal history of transient ischemic attack (TIA), and cerebral infarction without residual deficits: Secondary | ICD-10-CM

## 2024-03-15 DIAGNOSIS — G8194 Hemiplegia, unspecified affecting left nondominant side: Secondary | ICD-10-CM

## 2024-03-15 DIAGNOSIS — R4189 Other symptoms and signs involving cognitive functions and awareness: Secondary | ICD-10-CM

## 2024-03-15 DIAGNOSIS — G40909 Epilepsy, unspecified, not intractable, without status epilepticus: Secondary | ICD-10-CM | POA: Diagnosis not present

## 2024-03-15 MED ORDER — LEVETIRACETAM 1000 MG PO TABS: 1000.0000 mg | ORAL_TABLET | Freq: Two times a day (BID) | ORAL | 11 refills | Status: AC

## 2024-03-15 NOTE — Patient Instructions (Signed)
 Good to see you again.  A prescription was sent for Levetiracetam  (Keppra ) 1000mg : take 1 tablet twice a day  2. Follow-up in 6 months, call for any changes   Seizure Precautions: 1. If medication has been prescribed for you to prevent seizures, take it exactly as directed.  Do not stop taking the medicine without talking to your doctor first, even if you have not had a seizure in a long time.   2. Avoid activities in which a seizure would cause danger to yourself or to others.  Don't operate dangerous machinery, swim alone, or climb in high or dangerous places, such as on ladders, roofs, or girders.  Do not drive unless your doctor says you may.  3. If you have any warning that you may have a seizure, lay down in a safe place where you can't hurt yourself.    4.  No driving for 6 months from last seizure, as per [place]  state law.   Please refer to the following link on the [employer]'s website for more information: http://www.epilepsyfoundation.org/answerplace/Social/driving/drivingu.cfm   5.  Maintain good sleep hygiene.  6.  Notify your neurology if you are planning pregnancy or if you become pregnant.  7.  Contact your doctor if you have any problems that may be related to the medicine you are taking.  8.  Call 911 and bring the patient back to the ED if:        A.  The seizure lasts longer than 5 minutes.       B.  The patient doesn't awaken shortly after the seizure  C.  The patient has new problems such as difficulty seeing, speaking or moving  D.  The patient was injured during the seizure  E.  The patient has a temperature over 102 F (39C)  F.  The patient vomited and now is having trouble breathing

## 2024-03-16 ENCOUNTER — Other Ambulatory Visit: Payer: Self-pay | Admitting: Family Medicine

## 2024-03-16 DIAGNOSIS — K219 Gastro-esophageal reflux disease without esophagitis: Secondary | ICD-10-CM

## 2024-03-16 DIAGNOSIS — E782 Mixed hyperlipidemia: Secondary | ICD-10-CM

## 2024-07-23 ENCOUNTER — Encounter: Admitting: Family Medicine

## 2024-09-18 ENCOUNTER — Ambulatory Visit: Payer: Self-pay | Admitting: Neurology
